# Patient Record
Sex: Female | Born: 1935
Health system: Southern US, Community
[De-identification: ages and names within clinical notes are randomized; demographics above are authoritative.]

## PROBLEM LIST (undated history)

## (undated) DIAGNOSIS — Y92009 Unspecified place in unspecified non-institutional (private) residence as the place of occurrence of the external cause: Secondary | ICD-10-CM

## (undated) DIAGNOSIS — F32A Depression, unspecified: Secondary | ICD-10-CM

## (undated) DIAGNOSIS — J449 Chronic obstructive pulmonary disease, unspecified: Secondary | ICD-10-CM

## (undated) DIAGNOSIS — K52832 Lymphocytic colitis: Secondary | ICD-10-CM

## (undated) DIAGNOSIS — F329 Major depressive disorder, single episode, unspecified: Secondary | ICD-10-CM

## (undated) DIAGNOSIS — D126 Benign neoplasm of colon, unspecified: Secondary | ICD-10-CM

## (undated) DIAGNOSIS — J189 Pneumonia, unspecified organism: Secondary | ICD-10-CM

## (undated) DIAGNOSIS — G932 Benign intracranial hypertension: Secondary | ICD-10-CM

## (undated) DIAGNOSIS — K279 Peptic ulcer, site unspecified, unspecified as acute or chronic, without hemorrhage or perforation: Secondary | ICD-10-CM

## (undated) DIAGNOSIS — F419 Anxiety disorder, unspecified: Secondary | ICD-10-CM

## (undated) DIAGNOSIS — K589 Irritable bowel syndrome without diarrhea: Secondary | ICD-10-CM

## (undated) DIAGNOSIS — W19XXXA Unspecified fall, initial encounter: Secondary | ICD-10-CM

## (undated) DIAGNOSIS — C801 Malignant (primary) neoplasm, unspecified: Secondary | ICD-10-CM

## (undated) DIAGNOSIS — I2699 Other pulmonary embolism without acute cor pulmonale: Secondary | ICD-10-CM

## (undated) DIAGNOSIS — E785 Hyperlipidemia, unspecified: Secondary | ICD-10-CM

## (undated) DIAGNOSIS — IMO0001 Reserved for inherently not codable concepts without codable children: Secondary | ICD-10-CM

## (undated) DIAGNOSIS — IMO0002 Reserved for concepts with insufficient information to code with codable children: Secondary | ICD-10-CM

## (undated) DIAGNOSIS — K219 Gastro-esophageal reflux disease without esophagitis: Secondary | ICD-10-CM

## (undated) HISTORY — PX: OTHER SURGICAL HISTORY: SHX169

## (undated) HISTORY — DX: Anxiety disorder, unspecified: F41.9

## (undated) HISTORY — PX: ABDOMINAL HYSTERECTOMY: SHX81

## (undated) HISTORY — DX: Major depressive disorder, single episode, unspecified: F32.9

## (undated) HISTORY — DX: Peptic ulcer, site unspecified, unspecified as acute or chronic, without hemorrhage or perforation: K27.9

## (undated) HISTORY — DX: Malignant (primary) neoplasm, unspecified: C80.1

## (undated) HISTORY — DX: Chronic obstructive pulmonary disease, unspecified: J44.9

## (undated) HISTORY — DX: Pneumonia, unspecified organism: J18.9

## (undated) HISTORY — DX: Reserved for inherently not codable concepts without codable children: IMO0001

## (undated) HISTORY — DX: Unspecified fall, initial encounter: W19.XXXA

## (undated) HISTORY — PX: UPPER GASTROINTESTINAL ENDOSCOPY: SHX188

## (undated) HISTORY — DX: Irritable bowel syndrome, unspecified: K58.9

## (undated) HISTORY — DX: Depression, unspecified: F32.A

## (undated) HISTORY — DX: Benign neoplasm of colon, unspecified: D12.6

## (undated) HISTORY — DX: Lymphocytic colitis: K52.832

## (undated) HISTORY — DX: Unspecified place in unspecified non-institutional (private) residence as the place of occurrence of the external cause: Y92.009

## (undated) HISTORY — DX: Benign intracranial hypertension: G93.2

## (undated) HISTORY — DX: Reserved for concepts with insufficient information to code with codable children: IMO0002

## (undated) HISTORY — DX: Gastro-esophageal reflux disease without esophagitis: K21.9

## (undated) HISTORY — PX: BRAIN SURGERY: SHX531

## (undated) HISTORY — PX: CATARACT EXTRACTION: SUR2

## (undated) HISTORY — DX: Other pulmonary embolism without acute cor pulmonale: I26.99

## (undated) HISTORY — DX: Hyperlipidemia, unspecified: E78.5

## (undated) HISTORY — PX: COLON RESECTION: SHX5231

## (undated) HISTORY — PX: COLONOSCOPY: SHX174

## (undated) HISTORY — PX: CHOLECYSTECTOMY: SHX55

---

## 1998-01-20 ENCOUNTER — Ambulatory Visit (HOSPITAL_COMMUNITY): Admission: RE | Admit: 1998-01-20 | Discharge: 1998-01-20 | Payer: Self-pay | Admitting: Family Medicine

## 1998-06-01 ENCOUNTER — Other Ambulatory Visit: Admission: RE | Admit: 1998-06-01 | Discharge: 1998-06-01 | Payer: Self-pay | Admitting: Family Medicine

## 1999-01-26 ENCOUNTER — Ambulatory Visit (HOSPITAL_COMMUNITY): Admission: RE | Admit: 1999-01-26 | Discharge: 1999-01-26 | Payer: Self-pay | Admitting: Family Medicine

## 1999-01-26 ENCOUNTER — Encounter: Payer: Self-pay | Admitting: Family Medicine

## 1999-02-10 ENCOUNTER — Encounter: Payer: Self-pay | Admitting: Family Medicine

## 1999-02-10 ENCOUNTER — Encounter: Admission: RE | Admit: 1999-02-10 | Discharge: 1999-02-10 | Payer: Self-pay | Admitting: Family Medicine

## 1999-07-22 ENCOUNTER — Other Ambulatory Visit: Admission: RE | Admit: 1999-07-22 | Discharge: 1999-07-22 | Payer: Self-pay | Admitting: *Deleted

## 2000-01-27 ENCOUNTER — Ambulatory Visit (HOSPITAL_COMMUNITY): Admission: RE | Admit: 2000-01-27 | Discharge: 2000-01-27 | Payer: Self-pay | Admitting: Family Medicine

## 2000-01-27 ENCOUNTER — Encounter: Payer: Self-pay | Admitting: Family Medicine

## 2000-02-24 ENCOUNTER — Encounter: Admission: RE | Admit: 2000-02-24 | Discharge: 2000-02-24 | Payer: Self-pay

## 2000-03-17 ENCOUNTER — Ambulatory Visit (HOSPITAL_COMMUNITY): Admission: RE | Admit: 2000-03-17 | Discharge: 2000-03-17 | Payer: Self-pay | Admitting: Gastroenterology

## 2000-03-17 ENCOUNTER — Encounter: Payer: Self-pay | Admitting: Gastroenterology

## 2000-04-13 ENCOUNTER — Encounter (INDEPENDENT_AMBULATORY_CARE_PROVIDER_SITE_OTHER): Payer: Self-pay | Admitting: Specialist

## 2000-04-13 ENCOUNTER — Other Ambulatory Visit: Admission: RE | Admit: 2000-04-13 | Discharge: 2000-04-13 | Payer: Self-pay | Admitting: Gastroenterology

## 2001-01-31 ENCOUNTER — Ambulatory Visit (HOSPITAL_COMMUNITY): Admission: RE | Admit: 2001-01-31 | Discharge: 2001-01-31 | Payer: Self-pay | Admitting: *Deleted

## 2001-02-01 ENCOUNTER — Ambulatory Visit (HOSPITAL_COMMUNITY): Admission: RE | Admit: 2001-02-01 | Discharge: 2001-02-01 | Payer: Self-pay | Admitting: Family Medicine

## 2001-02-01 ENCOUNTER — Encounter: Payer: Self-pay | Admitting: Family Medicine

## 2001-03-20 ENCOUNTER — Other Ambulatory Visit: Admission: RE | Admit: 2001-03-20 | Discharge: 2001-03-20 | Payer: Self-pay | Admitting: Family Medicine

## 2001-04-19 ENCOUNTER — Encounter (INDEPENDENT_AMBULATORY_CARE_PROVIDER_SITE_OTHER): Payer: Self-pay | Admitting: Gastroenterology

## 2001-04-19 ENCOUNTER — Ambulatory Visit (HOSPITAL_COMMUNITY): Admission: RE | Admit: 2001-04-19 | Discharge: 2001-04-19 | Payer: Self-pay | Admitting: Gastroenterology

## 2002-01-10 DIAGNOSIS — I2699 Other pulmonary embolism without acute cor pulmonale: Secondary | ICD-10-CM

## 2002-01-10 HISTORY — DX: Other pulmonary embolism without acute cor pulmonale: I26.99

## 2002-04-02 ENCOUNTER — Other Ambulatory Visit: Admission: RE | Admit: 2002-04-02 | Discharge: 2002-04-02 | Payer: Self-pay | Admitting: Family Medicine

## 2002-05-15 ENCOUNTER — Ambulatory Visit (HOSPITAL_COMMUNITY): Admission: RE | Admit: 2002-05-15 | Discharge: 2002-05-15 | Payer: Self-pay | Admitting: *Deleted

## 2002-05-15 ENCOUNTER — Encounter: Payer: Self-pay | Admitting: *Deleted

## 2002-07-23 ENCOUNTER — Encounter: Payer: Self-pay | Admitting: Family Medicine

## 2002-07-23 ENCOUNTER — Ambulatory Visit (HOSPITAL_COMMUNITY): Admission: RE | Admit: 2002-07-23 | Discharge: 2002-07-23 | Payer: Self-pay | Admitting: Family Medicine

## 2002-08-04 ENCOUNTER — Encounter: Payer: Self-pay | Admitting: Emergency Medicine

## 2002-08-05 ENCOUNTER — Inpatient Hospital Stay (HOSPITAL_COMMUNITY): Admission: EM | Admit: 2002-08-05 | Discharge: 2002-08-12 | Payer: Self-pay | Admitting: Emergency Medicine

## 2002-08-15 ENCOUNTER — Encounter: Admission: RE | Admit: 2002-08-15 | Discharge: 2002-08-15 | Payer: Self-pay | Admitting: Family Medicine

## 2002-10-30 ENCOUNTER — Encounter: Payer: Self-pay | Admitting: Neurosurgery

## 2002-11-01 ENCOUNTER — Inpatient Hospital Stay (HOSPITAL_COMMUNITY): Admission: RE | Admit: 2002-11-01 | Discharge: 2002-11-05 | Payer: Self-pay | Admitting: Neurosurgery

## 2002-11-02 ENCOUNTER — Encounter: Payer: Self-pay | Admitting: Neurosurgery

## 2002-11-12 ENCOUNTER — Encounter: Admission: RE | Admit: 2002-11-12 | Discharge: 2002-11-12 | Payer: Self-pay | Admitting: Neurosurgery

## 2002-11-28 ENCOUNTER — Encounter: Admission: RE | Admit: 2002-11-28 | Discharge: 2002-11-28 | Payer: Self-pay | Admitting: Neurosurgery

## 2003-01-28 ENCOUNTER — Encounter: Admission: RE | Admit: 2003-01-28 | Discharge: 2003-01-28 | Payer: Self-pay | Admitting: Neurosurgery

## 2003-02-01 ENCOUNTER — Inpatient Hospital Stay (HOSPITAL_COMMUNITY): Admission: EM | Admit: 2003-02-01 | Discharge: 2003-02-08 | Payer: Self-pay

## 2003-03-31 ENCOUNTER — Emergency Department (HOSPITAL_COMMUNITY): Admission: EM | Admit: 2003-03-31 | Discharge: 2003-04-01 | Payer: Self-pay | Admitting: *Deleted

## 2003-04-01 ENCOUNTER — Inpatient Hospital Stay (HOSPITAL_COMMUNITY): Admission: EM | Admit: 2003-04-01 | Discharge: 2003-04-09 | Payer: Self-pay | Admitting: Emergency Medicine

## 2003-04-28 ENCOUNTER — Encounter: Admission: RE | Admit: 2003-04-28 | Discharge: 2003-04-28 | Payer: Self-pay | Admitting: Neurosurgery

## 2003-05-16 ENCOUNTER — Ambulatory Visit (HOSPITAL_COMMUNITY): Admission: RE | Admit: 2003-05-16 | Discharge: 2003-05-16 | Payer: Self-pay | Admitting: *Deleted

## 2003-06-12 ENCOUNTER — Encounter: Admission: RE | Admit: 2003-06-12 | Discharge: 2003-06-12 | Payer: Self-pay | Admitting: Neurosurgery

## 2003-07-21 ENCOUNTER — Other Ambulatory Visit: Admission: RE | Admit: 2003-07-21 | Discharge: 2003-07-21 | Payer: Self-pay | Admitting: Family Medicine

## 2003-08-18 ENCOUNTER — Encounter: Admission: RE | Admit: 2003-08-18 | Discharge: 2003-08-18 | Payer: Self-pay | Admitting: Neurosurgery

## 2003-09-05 ENCOUNTER — Inpatient Hospital Stay (HOSPITAL_COMMUNITY): Admission: EM | Admit: 2003-09-05 | Discharge: 2003-09-08 | Payer: Self-pay | Admitting: *Deleted

## 2004-02-26 ENCOUNTER — Encounter: Admission: RE | Admit: 2004-02-26 | Discharge: 2004-02-26 | Payer: Self-pay | Admitting: Neurosurgery

## 2004-08-24 ENCOUNTER — Other Ambulatory Visit: Admission: RE | Admit: 2004-08-24 | Discharge: 2004-08-24 | Payer: Self-pay | Admitting: Family Medicine

## 2004-10-07 ENCOUNTER — Encounter: Admission: RE | Admit: 2004-10-07 | Discharge: 2004-10-07 | Payer: Self-pay | Admitting: *Deleted

## 2004-11-18 ENCOUNTER — Encounter: Admission: RE | Admit: 2004-11-18 | Discharge: 2004-11-18 | Payer: Self-pay | Admitting: Neurosurgery

## 2005-01-10 ENCOUNTER — Inpatient Hospital Stay (HOSPITAL_COMMUNITY): Admission: EM | Admit: 2005-01-10 | Discharge: 2005-02-11 | Payer: Self-pay | Admitting: Emergency Medicine

## 2005-01-10 ENCOUNTER — Ambulatory Visit: Payer: Self-pay | Admitting: Physical Medicine & Rehabilitation

## 2005-01-10 DIAGNOSIS — J189 Pneumonia, unspecified organism: Secondary | ICD-10-CM

## 2005-01-10 HISTORY — DX: Pneumonia, unspecified organism: J18.9

## 2005-01-12 ENCOUNTER — Encounter (INDEPENDENT_AMBULATORY_CARE_PROVIDER_SITE_OTHER): Payer: Self-pay | Admitting: Specialist

## 2005-01-13 ENCOUNTER — Ambulatory Visit: Payer: Self-pay | Admitting: Pulmonary Disease

## 2005-02-02 ENCOUNTER — Ambulatory Visit: Payer: Self-pay | Admitting: Infectious Diseases

## 2005-02-07 ENCOUNTER — Encounter: Payer: Self-pay | Admitting: Infectious Diseases

## 2005-02-11 ENCOUNTER — Ambulatory Visit: Payer: Self-pay | Admitting: Physical Medicine & Rehabilitation

## 2005-02-11 ENCOUNTER — Inpatient Hospital Stay (HOSPITAL_COMMUNITY)
Admission: RE | Admit: 2005-02-11 | Discharge: 2005-02-18 | Payer: Self-pay | Admitting: Physical Medicine & Rehabilitation

## 2005-11-07 ENCOUNTER — Ambulatory Visit: Payer: Self-pay | Admitting: Gastroenterology

## 2005-11-07 ENCOUNTER — Ambulatory Visit (HOSPITAL_COMMUNITY): Admission: RE | Admit: 2005-11-07 | Discharge: 2005-11-07 | Payer: Self-pay | Admitting: *Deleted

## 2005-11-09 ENCOUNTER — Ambulatory Visit: Payer: Self-pay | Admitting: Gastroenterology

## 2005-12-09 ENCOUNTER — Ambulatory Visit (HOSPITAL_COMMUNITY): Admission: RE | Admit: 2005-12-09 | Discharge: 2005-12-09 | Payer: Self-pay | Admitting: Surgery

## 2005-12-09 ENCOUNTER — Encounter (INDEPENDENT_AMBULATORY_CARE_PROVIDER_SITE_OTHER): Payer: Self-pay | Admitting: Specialist

## 2005-12-12 ENCOUNTER — Inpatient Hospital Stay (HOSPITAL_COMMUNITY): Admission: EM | Admit: 2005-12-12 | Discharge: 2005-12-18 | Payer: Self-pay | Admitting: Emergency Medicine

## 2005-12-12 ENCOUNTER — Ambulatory Visit: Payer: Self-pay | Admitting: Pulmonary Disease

## 2005-12-16 ENCOUNTER — Ambulatory Visit: Payer: Self-pay | Admitting: Internal Medicine

## 2006-01-09 ENCOUNTER — Ambulatory Visit: Payer: Self-pay | Admitting: Internal Medicine

## 2006-01-23 ENCOUNTER — Ambulatory Visit (HOSPITAL_COMMUNITY): Admission: RE | Admit: 2006-01-23 | Discharge: 2006-01-23 | Payer: Self-pay | Admitting: Internal Medicine

## 2006-01-23 ENCOUNTER — Encounter: Payer: Self-pay | Admitting: Internal Medicine

## 2006-01-26 ENCOUNTER — Ambulatory Visit: Payer: Self-pay | Admitting: Internal Medicine

## 2006-10-17 ENCOUNTER — Ambulatory Visit: Payer: Self-pay | Admitting: Internal Medicine

## 2006-10-31 ENCOUNTER — Encounter: Payer: Self-pay | Admitting: Internal Medicine

## 2006-10-31 ENCOUNTER — Ambulatory Visit: Payer: Self-pay | Admitting: Internal Medicine

## 2006-11-13 ENCOUNTER — Ambulatory Visit (HOSPITAL_COMMUNITY): Admission: RE | Admit: 2006-11-13 | Discharge: 2006-11-13 | Payer: Self-pay | Admitting: Family Medicine

## 2007-11-14 ENCOUNTER — Ambulatory Visit (HOSPITAL_COMMUNITY): Admission: RE | Admit: 2007-11-14 | Discharge: 2007-11-14 | Payer: Self-pay | Admitting: Family Medicine

## 2008-05-13 ENCOUNTER — Encounter: Payer: Self-pay | Admitting: Internal Medicine

## 2008-10-15 ENCOUNTER — Encounter: Payer: Self-pay | Admitting: Internal Medicine

## 2008-10-20 ENCOUNTER — Ambulatory Visit: Payer: Self-pay | Admitting: Internal Medicine

## 2008-10-20 DIAGNOSIS — K219 Gastro-esophageal reflux disease without esophagitis: Secondary | ICD-10-CM

## 2008-10-20 DIAGNOSIS — R1319 Other dysphagia: Secondary | ICD-10-CM

## 2008-10-31 ENCOUNTER — Ambulatory Visit: Payer: Self-pay | Admitting: Internal Medicine

## 2008-11-10 ENCOUNTER — Encounter: Admission: RE | Admit: 2008-11-10 | Discharge: 2008-11-10 | Payer: Self-pay | Admitting: Family Medicine

## 2008-11-19 ENCOUNTER — Ambulatory Visit (HOSPITAL_COMMUNITY): Admission: RE | Admit: 2008-11-19 | Discharge: 2008-11-19 | Payer: Self-pay | Admitting: Family Medicine

## 2008-12-10 ENCOUNTER — Encounter: Admission: RE | Admit: 2008-12-10 | Discharge: 2008-12-10 | Payer: Self-pay | Admitting: Family Medicine

## 2009-12-28 ENCOUNTER — Ambulatory Visit (HOSPITAL_COMMUNITY)
Admission: RE | Admit: 2009-12-28 | Discharge: 2009-12-28 | Payer: Self-pay | Source: Home / Self Care | Attending: Family Medicine | Admitting: Family Medicine

## 2010-01-30 ENCOUNTER — Encounter: Payer: Self-pay | Admitting: Family Medicine

## 2010-01-31 ENCOUNTER — Encounter: Payer: Self-pay | Admitting: Family Medicine

## 2010-01-31 ENCOUNTER — Encounter: Payer: Self-pay | Admitting: Internal Medicine

## 2010-01-31 ENCOUNTER — Encounter: Payer: Self-pay | Admitting: Neurosurgery

## 2010-05-28 NOTE — Discharge Summary (Signed)
NAMEABBAGAYLE, ZARAGOZA                ACCOUNT NO.:  [id number]   MEDICAL RECORD NO.:  [id number]          PATIENT TYPE:  INP   LOCATION:  5736                         FACILITY:  [facility]   PHYSICIAN:  Currie Paris, M.D.DATE OF BIRTH:  August 16, 1932   DATE OF ADMISSION:  12/12/2005  DATE OF DISCHARGE:  12/18/2005                               DISCHARGE SUMMARY   FINAL DIAGNOSES:  1. Bile leak status post laparoscopic cholecystectomy.  2. Severe chronic obstructive pulmonary disease.  3. History of ethanol alcohol abuse.  4. History of Wernicke encephalopathy.   CLINICAL HISTORY:  [name] is a 77 year old lady who underwent  cholecystectomy a few days prior to this admission.  She called the  office and was having increasing abdominal pain, nausea and vomiting.  She was seen in the Emergency Department and noted on exam to have  diffuse abdominal tenderness.  Laboratory studies showed some increase  in her white count of 13,000, slight increase in liver functions.  Clinically, she was thought to have probable bile leak.   HOSPITAL COURSE:  The patient was seen in the Emergency Department,  started on IV fluids and admitted.  We attempted to get a hepatobiliary  scan, but there is no contrast available, so a CT scan was performed.  This showed collection of fluid in the right upper quadrant consistent  with bile leak.   GI consultation was obtained and the next day, the patient taken to  Radiology where an ERCP and stent placement was performed.  She had an  apparent leak from a duct of Luschka.   Over the next several days, her GI tract gradually improved.  She did  have some mild respiratory insufficiency that was seen by Dr. Jayme Cloud  of Critical Care, but did not require intubation.   As noted, she was able to begin on some liquids.  She had somewhat low  potassium was given IV and then p.o. KCl.  Her bile leak as noted,  gradually decreased.  Her abdominal pain gradually  improved, but very  slow.  Some of her nausea was thought secondary to her hypokalemia.  Followup CT scan was also obtained and showed just an ileus pattern.  There is no evidence of abscess.  She did have some mild mental  confusion, but again this gradually improved and was just thought  secondary to sedation and chronic illness.   By the sixth hospital day, she was improving.  Because of  malnourishment, we did put her on TNA and that was able to be  discontinued on the 8th.  She was tolerating a solid diet, ambulating,  breathing well.  Appropriate oxygen sats.  Overall, feeling well.   The patient is discharged on 12/09 to be followed in my office.   LABORATORY STUDIES:  Admission white count of 13,000, hemoglobin of 15.  This drifted down to 10 white count and 10 hemoglobin just prior to  discharge.  Potassium on admission was 4.1, but had improved to 4 by  12/07.  One BMP was 121 which was slightly elevated.  At time of  discharge, liver functions were normal.  We had two stools that were  negative for C. Diff.   DISCHARGE CONDITION:  She was in satisfactory condition.   MEDICATIONS:  As noted on the chart.      Currie Paris, M.D.  Electronically Signed     CJS/MEDQ  D:  03/01/2006  T:  03/01/2006  Job:  [id number]

## 2010-05-28 NOTE — Discharge Summary (Signed)
NAMEENIS, LEATHERWOOD                ACCOUNT NO.:  [id number]   MEDICAL RECORD NO.:  [id number]          PATIENT TYPE:  IPS   LOCATION:  4028                         FACILITY:  [facility]   PHYSICIAN:  Ranelle Oyster, M.D.DATE OF BIRTH:  11-Jun-1932   DATE OF ADMISSION:  02/11/2005  DATE OF DISCHARGE:  02/18/2005                                 DISCHARGE SUMMARY   DISCHARGE DIAGNOSIS:  1.  Deconditioning secondary to VDRS with thoracotomy secondary to      hemothorax and empyema.  2.  Mild hyponatremia, resolved.  3.  Chronic obstructive pulmonary disease.  4.  Normal pressure hydrocephalus.  5.  Pseudomonas tracheobronchitis, treated.  6.  Anxiety disorder.   HISTORY OF PRESENT ILLNESS:  Ms. Thurow is a 77 year old female with a  history of COPD, ETOH, and tobacco abuse, admitted January 1 with recent  pneumonia and left pleuritic chest pain.  She was noted to have left  pneumonia, started on IV vancomycin for treatment on January 3.  Patient  with increased shortness of breath and acute respiratory failure requiring  chest tube placement for empyema drainage.  She did require admission later  that day.  She was also noted to have acute blood loss anemia with  hypovolemic shock and massive left hemothorax on top of pre-existing  empyema.  She underwent emergent thoracotomy for evacuation of hemothorax  and drainage of empyema when seen by Dr. Cornelius Moras.  Patient with difficulty at  extubation attempts, therefore, she was trach'd on January 8.  Currently,  patient off vent and has been down sized to #4 Shiley.  She is tolerating  Passey-Muir valve trials and swallow evaluation was done.  The patient was  currently advanced to a regular diet, thin liquids.  She has been treated  with multiple antibiotics, the most recent BAL culture positive for  Pseudomonas and patient has been on Cipro since January 29.  She is noted to  have impairments in mobility and self-care and rehab was consulted  for  further therapies.   PAST MEDICAL HISTORY:  Significant for encephalopathy, COPD, ETOH abuse,  dementia with NPH, depression, hysterectomy, essential tremor, BPS with  removal in October 2005 secondary to infection.   ALLERGIES:  Penicillin, codeine, and sulfa.   FAMILY HISTORY:  Positive for cancer and Alzheimer's disease.   SOCIAL HISTORY:  The patient lives with daughter in one level with 3-5 steps  at entry.  She smokes one pack per day.  She admits to having one drink  daily.  Her daughter is an Charity fundraiser who is not working currently.   HOSPITAL COURSE:  Ms. Llewellyn Schoenberger admitted to rehab on February 11, 2005,  for inpatient therapies to consist of PT and OT daily.  Past admission,  subcu Lovenox was added for DVT prophylaxis until mobility improved.  Patient with high levels of anxiety and noted to have anxiety with attempts  at total occlusion of trach initially.  She was started on BuSpar 5 mg  b.i.d. on February 3.  She was able to tolerate Passey-Muir valve for 24  hour period throughout February  4 and, therefore, she was decannulated on  February 14, 2005, without difficulty.  Currently, respiratory status has  been stable without any signs of hypoxia.  Anxiety levels continue to be an  issue and BuSpar was increased to 10 mg p.o. b.i.d.  Follow up labs done to  check on anemia reveals H&H to be improving with hemoglobin 11.1, hematocrit  33.4, white count 9.5, bilirubin 397.  Check of electrolytes reveal sodium  129, potassium 3.5, chloride 109, CO2 23, BUN 7, creatinine 0.7, glucose  100.  LFTs improving with albumin 2.4, AST 26, ALT 57, alkaline phos 90, T-  bili 0.7.  The patient's  mood has been stable.  She has been motivated and  participated well with therapy.  Currently, is continent of bowel and  bladder.  She was started on Senokot S 2 p.o. q.h.s. increase to b.i.d. for  constipation.  P.o. intake has been good.  At the time of discharge, the  patient is at  supervision level for transfers, min assist for ambulating 150  feet without assistive device, supervision ambulating 200 feet with  assistive device.  She requires min to mod assist to navigate stairs.  She  is at supervision to set up assist for ADLs.  Family is to provide 24 hour  supervision past discharge.  She will continue with further follow up home  health physical therapy and occupational therapy by [facility].  Home  health RN has been arranged to follow up with closure of tracheostomy past  discharge.  On February 18, 2005, the patient is discharged to home.   DISCHARGE MEDICATIONS:  Ferrous sulfate 325 mg b.i.d., Senokot-S 2 p.o.  q.h.s., Nexium 40 mg daily, BuSpar 10 mg b.i.d., Zyrtec 10 mg per day,  Flonase and Albuterol p.r.n. as at home, OK to resume Remeron at 1/2 p.o.  q.h.s. for one week then increase to one per day, Advair 1 puff b.i.d.,  Valium only as needed.   DISCHARGE INSTRUCTIONS:  Activities at supervision level.  Wound care:  Wear  dry occlusive dressing until stoma is fully closed up.  No alcohol, no  smoking, no driving.  Do not use Phenobarb, Dyazide, and Paxil.  The patient  to follow up with Dr. Lowanda Foster for further adjustment of meds.  Follow up  with Dr. Riley Kill as needed.      Greg Cutter, P.A.      Ranelle Oyster, M.D.  Electronically Signed    PP/MEDQ  D:  02/18/2005  T:  02/19/2005  Job:  [id number]   cc:   Caryl Comes. Slotnick, M.D.  Fax: [phone]   Salvatore Decent. Cornelius Moras, M.D.  [address]  [place]  [place] [address]   Lacretia Leigh. Ninetta Lights, M.D.  Fax: [phone]   Coralyn Helling, M.D.

## 2010-05-28 NOTE — H&P (Signed)
NAME:  [name], [name]                          ACCOUNT NO.:  [id number]   MEDICAL RECORD NO.:  [id number]                   PATIENT TYPE:  INP   LOCATION:  1827                                 FACILITY:  [facility]   PHYSICIAN:  Hilda [name], M.D.                DATE OF BIRTH:  01/26/1932   DATE OF ADMISSION:  04/01/2003  DATE OF DISCHARGE:                                HISTORY & PHYSICAL   TIME:  4:40 a.m.   [name] is a lady who was brought last night to the emergency room by her  husband because of fever and decreased level of consciousness.  I was called  about 1 o'clock this morning to let me know that [name] was admitted to  the hospital with fever, and there was a possibility of shunt malfunction.  I advised for [name] to be transferred to [facility], and  finally, she arrived here about 4:15 a.m.  The history that she was doing  fairly well, but yesterday she started having some decreased level of  consciousness, she had a fever, and she was shaking all over.  Patient had  been unable to eat or drink.  At __________ emergency room, she had a urine  which was negative.  Her chest x-ray which was negative.  Blood count which  showed 22,000 white cells, and a CT scan which showed the possibility of  shunt malfunction.  Because we did the surgery, we were called for  admission.   PAST MEDICAL HISTORY:  1. She has a history of hypertension.  2. Depression.  3. Back pain.  4. COPD.  5. Tobacco use.   Patient used to drink heavy until sometime, September, 2004, where she  stopped according to the family.   MEDICATIONS:  1. She is taking Paxil.  2. She is taking Nexium.  3. Premarin.  4. BuSpar.   SHE IS ALLERGIC TO PENICILLIN, SULFA, AND CODEINE.   PHYSICAL EXAMINATION:  GENERAL:  Patient who is able to open her eyes, and  she moves all four extremities, and she answers per yes and no.  EYES:  She has patch on the left eye.  This is secondary to  cranial nerve  palsy.  NECK:  She has some rigidity of the neck.  LUNGS:  There is some bilateral rhonchi.  HEART:  Sounds normal.  ABDOMEN:  Normal.  EXTREMITIES:  Normal pulses.  NEURO:  She is able to answer yes and no.  She is oriented only to time.  Cranial nerves:  She has a patch on the left eye.  The face is symmetrical.  She is able to swallow.  CNS seems to be normal.  Strength:  She moves all  the four extremities.  __________.   Unfortunately, the CT scan was not sent, however, it was read by the  radiologist as a possible shunt malfunction.  I obtained  this film myself,  and, indeed, the shunt was working appropriately.   [name] did review upon arrival at __________ was 102, and here is 101.   CLINICAL IMPRESSION:  Fever.  Rule out the possibility of shunt malfunction  or infection.   RECOMMENDATIONS:  The patient is going to be admitted to my service.  I will  be calling Dr. Genene Churn. Love from the Neurology service as well as  Infectious Disease tomorrow.  I will be capping the shunt tonight after  insuring there is no infection.  I talked to the husband and to the daughter  and made them aware about the admission would be to identify the source of  the fever, but it does not seem that a leak that is coming from the shunt,  and the shunt is working properly.                                                Hilda [name], M.D.    EB/MEDQ  D:  04/01/2003  T:  04/01/2003  Job:  [id number]

## 2010-05-28 NOTE — Op Note (Signed)
[name], [name]                ACCOUNT NO.:  [id number]   MEDICAL RECORD NO.:  [id number]          PATIENT TYPE:  AMB   LOCATION:  SDS                          FACILITY:  [facility]   PHYSICIAN:  Currie Paris, M.D.DATE OF BIRTH:  05/04/1932   DATE OF PROCEDURE:  12/09/2005  DATE OF DISCHARGE:  12/09/2005                               OPERATIVE REPORT   PREOPERATIVE DIAGNOSIS:  Chronic calculus cholecystitis.   POSTOPERATIVE DIAGNOSIS:  Chronic calculus cholecystitis.   OPERATION:  Laparoscopic cholecystectomy with operative cholangiogram.   SURGEON:  Currie Paris, M.D.   ASSISTANT:  Gita Kudo, M.D.   ANESTHESIA:  General endotracheal.   CLINICAL HISTORY:  This is a 73-year lady with significant COPD but has  had GI symptoms including those that sounded like biliary colic.  Workup  revealed gallstones.  She elected to proceed to laparoscopic  cholecystectomy.   DESCRIPTION OF PROCEDURE:  The patient was seen in the holding area.  She had no further questions.  We confirmed that cholecystectomy was the  planned procedure.  The patient was taken to the operating room and  after satisfactory general endotracheal anesthesia had been obtained,  the abdomen was prepped and draped.  The time-out occurred.   0.25% plain Marcaine was used for each incision.  The initial incision  was made just above the umbilicus.  She had a lower midline scar that  extended to the level of the umbilicus or just a little bit above.  The  fascia was opened and the peritoneal cavity identified.  There were  omental adhesions that were gently taken down with finger dissection.   A pursestring suture of 0 Vicryl was placed, the [place] introduced, and  the abdomen insufflated to 15.  There were multiple adhesions but I had  a clear view of the upper midline, so I put a 10/11 trocar in under  direct vision.  I put the camera in there and noted that we had  adhesions extending into the  right upper quadrant from a prior right  upper quadrant scar.  She was unclear as to what that scar was from but  given its location and the adhesions of omentum up here, I suspect she  may have had a temporary colostomy.  At any rate, these were taken down  sharply with a couple of clips placed on some vessels.  This gave [place]  good exposure of the right upper quadrant.  Two 5 mm trocars were then  placed under direct vision.   The patient was placed in reverse Trendelenburg and tilted to the left.  Multiple omental adhesions to the gallbladder were taken down.  The  peritoneum over the cystic duct was opened and I was able to dissect out  a nice long length of cystic duct and cystic artery to make a nice  window behind these two structures and see there were no other  structures in the triangle of Calot.   A single clip was placed on the cystic duct and one on the cystic  artery.  The cystic duct was opened and  a taut catheter introduced.  Operative cholangiogram showed normal duct with good filling of the  duodenum and hepatic radicals.  The catheter was removed and three clips  placed on the stay side of the cystic duct.  Three additional clips were  placed on the cystic artery and it was divided.  The gallbladder was  removed from below to above.  A small vessel laterally was clipped.  The  gallbladder was placed in a bag temporarily then we irrigated and made  sure the bed of the gallbladder was dry.  The gallbladder was then  brought out the umbilical port.   I closed the supraumbilical site was to figure-of-eight sutures of 0  Prolene.  I then reinsufflated and made a final check for hemostasis  with the camera in the epigastric port.  I looked back to make sure  there had been no evidence of bowel getting caught in the umbilical  incision and everything looked fine.  The lateral ports were removed  under direct vision.  The abdomen was deflated through the epigastric  port  which was then removed.  The skin was closed with 4-0 Monocryl  subcuticular and Dermabond.  The patient tolerated the procedure well.  There no operative complications and all counts were correct.      Currie Paris, M.D.  Electronically Signed     CJS/MEDQ  D:  12/09/2005  T:  12/09/2005  Job:  [id number]   cc:   [name], M.D.  Sara Mort, MD

## 2010-05-28 NOTE — Discharge Summary (Signed)
   NAME:  [name], [name]                          ACCOUNT NO.:  [id number]   MEDICAL RECORD NO.:  [id number]                   PATIENT TYPE:  INP   LOCATION:  3037                                 FACILITY:  [facility]   PHYSICIAN:  Hilda Lias, M.D.                DATE OF BIRTH:  1932-01-29   DATE OF ADMISSION:  11/01/2002  DATE OF DISCHARGE:  11/05/2002                                 DISCHARGE SUMMARY   ADMISSION DIAGNOSES:  1. Normal pressure hydrocephalus.  2. Alzheimer's disease.   POSTOPERATIVE DIAGNOSES:  1. Normal pressure hydrocephalus.  2. Alzheimer's disease.   CLINICAL HISTORY:  The patient was admitted after being worked up by Dr.  Avie Echevaria. The patient has a large ventricle which after she had a spinal  tap her gait and memory got better. CSF was positive for Alzheimer's  disease. Because of the symptoms, Dr. Sandria Manly felt that she needed a VP shunt.   LABORATORY DATA:  Normal.   COURSE IN THE HOSPITAL:  The patient was taken to surgery, and a right  ventriculoperitoneal shunt was inserted.  The valve was brought down to 100  mm operating pressure. Since then, we have repeated the CT scan which showed  _________ of the ventricle. Today, she is doing much better, has minimal  complaint. The wound looks fine, and she is ready to go home.   CONDITION ON DISCHARGE:  Improving.   MEDICATIONS:  She is going to be taking the same medications as prior to  being admitted to the hospital. She will be taking Tylenol for pain.   DIET:  Regular.   ACTIVITY:  She is going to be seen by the home health service for followup  therapy. I will be see her in my office in two weeks.                                                Hilda Lias, M.D.    EB/MEDQ  D:  11/05/2002  T:  11/05/2002  Job:  [id number]

## 2010-05-28 NOTE — Consult Note (Signed)
NAME:  [name], [name]                          ACCOUNT NO.:  [id number]   MEDICAL RECORD NO.:  [id number]                   PATIENT TYPE:  INP   LOCATION:  3021                                 FACILITY:  [facility]   PHYSICIAN:  Genene Churn. Love, M.D.                 DATE OF BIRTH:  08-16-1932   DATE OF CONSULTATION:  08/08/2002  DATE OF DISCHARGE:                                   CONSULTATION   REASON FOR CONSULTATION:  This 77 year old right-handed white married female  with COPD, IBS, depression, diverticulitis, sinus infections,  gastroesophageal reflux disease was admitted on August 04, 2002 for altered  mental status and confusional state.  We are now asked to see her for  evaluation of gait and possible dementia.   HISTORY OF PRESENT ILLNESS:  [name] presented with altered mental  status, had a leukocytosis, and a history of emphysema.  She has had a 1-1/2  year history of falling and tends to fall either forward or to her right  according to her husband and at times has exhibited festinating gait.  She  fell as recently as February, striking her head, but has not used a cane or  walker.  He has not noted memory problems but other family members have  noted memory problems.  She still keeps her mother's books.  She has known  history of alcohol use, approximately 3 ounces per day, and a positive  family history of Alzheimer's disease and her mother had that disorder.  She  has had an MRI study of the brain as an outpatient on July 23, 2002 which  showed small right vertebral artery and thickening of the frontal sinuses  and diffuse atrophy and minimal hydrocephalus with diffuse atrophy.  No  mention was made of significant small vessel ischemic disease.  There was no  evidence of an acute infarction.  She has no history of exposure to  medications associated with Parkinson's-like features.  She has not had any  neck pain radiating to her hands and arms.  She denies any bowel or  bladder  incontinence but does have irritable bowel syndrome.  Her husband has noted  a shuffling-type of gait with weakness in her feet and legs.  There has  possibly been some change in her handwriting with more tremor in her  handwriting.  She also has a past history of endometriosis, cholecystectomy,  pulmonary emboli, and DVT.   MEDICATIONS:  1. Paxil CR 25 mg p.o. daily.  2. Nexium 40 mg p.o. daily.  3. Triamterene/HCTZ 37.5/25 one p.o. daily.  4. Zyrtec 10 mg 1 p.o. p.r.n.  5. Diazepam 10 mg 1 p.o. t.i.d. and 5 mg p.o. t.i.d.  6. Hyoscyamine 0.375 mg 1 p.o. daily.  7. Vitamin A 10,000 international units p.o. daily.  8. Mucinex 600 mg p.o. daily.  9. Premarin 0.625 mg daily.  10.  B12 1000 mEq 1 p.o. daily.  11.      Garlic 600 mg 2 p.o. daily.  12.      Vitamin C 1000 mg 2 p.o. daily.  13.      Calcium with vitamin D 600 mg 2 p.o. daily.  14.      Vitamin E 1000 international units p.o. daily.  15.      Folic acid 400 mcg 1 p.o. daily.  16.      Cider vinegar tablets 1 p.o. daily.  17.      Aspirin 325 mg p.o. daily.   PAST MEDICAL HISTORY:  1. Chronic obstructive pulmonary disease.  2. Irritable bowel syndrome.  3. Depression.  4. Diverticulitis.  5. Allergies.  6. Pulmonary emboli.  7. DVT.  8. Partial colectomy.  9. Fractured foot in 1999.  10.      Hemorrhoid surgery many years ago.   SOCIAL HISTORY:  She does drink alcohol.  She admits to 3 ounces per night.  She smokes one pack per day of cigarettes.   ALLERGIES:  1. PENICILLIN.  2. SULFA.  3. CODEINE.   PHYSICAL EXAMINATION:  GENERAL:  A well-developed white female with marked  bradykinesia.  VITAL SIGNS:  Sitting blood pressure in the right and left arm in 95/60  range, standing 120/60 with a heart rate of 64.  No carotid or  supraclavicular bruits heard.  Marked bradykinesia with psychomotor  retardation.  NEUROLOGIC:  Mental status:  Alert, oriented to person, place, year, and  month.  Could  remember one of three objects at five minutes.  Could not  remember what she had for dinner the night before.  Could not perform serial  7s except to 93.  Knew the day of the week and date.  Would have scored  approximately 22/30 on MMSE.  Cranial nerve examination revealed visual  fields to be full, disks were flat.  The extraocular movements were full  including up and down gaze, corneals were present, and facial sensation was  equal.  There was no facial motor asymmetry.  She had hypomimia and  hypophonia.  Her tongue was midline, gags were present.  Motor examination  revealed no significant increased tone.  She did have an outstretched hand  and arm tremor.  Her deep tendon reflexes were 2-3+ and plantar responses  were thought to be downgoing when the patient relaxed but at times would  withdraw.  Her gait examination revealed an ataxic type of gait falling both  to the left and to the right with associated retropulsion both in the  standing and in the sitting position.  She did not have a festinating  quality to her gait on examination but one had been described by her  husband.   IMPRESSION:  1. Gait disorder (code 781.2).  2. Dementia (code 780.93).  3. Atrophy on CT scan and possible normal-pressure hydrocephalus (code     794.09).  4. Possible hydrocephalus by history but no associated bowel or bladder     incontinence (code 331.3).    PLAN:  The plan at this time is to obtain a cerebrospinal fluid evaluation  and do drainage procedure with physical therapy in order to see if lumbar  puncture improves possible gait disorder and confirm the diagnosis of normal-  pressure hydrocephalus.  Other consideration would be use cerebrospinal  fluid for evaluation for tau and beta-amyloid 42 protein.  Genene Churn. Sandria Manly, M.D.    JML/MEDQ  D:  08/08/2002  T:  08/09/2002  Job:  [id number]  cc:   Ernestina Penna, M.D.  [address] [place]  [place] [address]  Fax: [phone]

## 2010-05-28 NOTE — Consult Note (Signed)
NAME:  [name], [name]                          ACCOUNT NO.:  [id number]   MEDICAL RECORD NO.:  [id number]                   PATIENT TYPE:  INP   LOCATION:  3018                                 FACILITY:  [facility]   PHYSICIAN:  Hilda Lias, M.D.                DATE OF BIRTH:  20-Sep-1932   DATE OF CONSULTATION:  02/04/2003  DATE OF DISCHARGE:                                   CONSULTATION   REFERRING SERVICE:  Neurology.   REASON FOR CONSULTATION:  [name] is a lady well-known to me who underwent  a shunt for normal-pressure hydrocephalus.  I had been seeing her in my  office and she had a CT scan about a week ago which showed normal  ventricles, no evidence of hygroma.  She has sinusitis.  This lady was  admitted to [facility] because of possibility of stroke.  MRI shows  enhancement of the dura mater.  Neurology has called [place] to rule out the  possibility of infection.   PROCEDURE:  The patient had the shunt prepped with Betadine.  After that, we  used a small 25-gauge needle and clear CSF was obtained.  The specimen is  going to be sent to the laboratory for protein, cell count, Gram's stain,  culture and sensitivity.   We will follow her while she is in the hospital.                                               Hilda Lias, M.D.    EB/MEDQ  D:  02/04/2003  T:  02/05/2003  Job:  [id number]

## 2010-05-28 NOTE — Discharge Summary (Signed)
NAME:  [name], [name]                          ACCOUNT NO.:  [id number]   MEDICAL RECORD NO.:  [id number]                   PATIENT TYPE:  INP   LOCATION:  3040                                 FACILITY:  [facility]   PHYSICIAN:  Stefani Dama, M.D.               DATE OF BIRTH:  [birth year]   DATE OF ADMISSION:  04/01/2003  DATE OF DISCHARGE:  04/09/2003                                 DISCHARGE SUMMARY   ADMISSION DIAGNOSES:  1. Shunt infection.  2. Pneumocephalus.  3. Pneumoperitoneum.   FINAL DIAGNOSES:  1. Shunt infection.  2. Pneumocephalus.  3. Pneumoperitoneum.   OPERATION:  Removal of ventriculoperitoneal shunt on April 01, 2003, by Dr.  Hilda Lias.   CONSULTATIONS:  1. Neurology, Dr. Melbourne Abts.  2. Infectious disease, Dr. Lenn Sink.   HOSPITAL COURSE:  [name] is a 77 year old individual who was felt  to have normal pressure hydrocephalus.  She underwent placement of a  ventriculoperitoneal shunt for treatment of this process and seemed to be  tolerating this quite well.  However, she had had hospitalizations in the  end of January for what was felt to be a shunt infection.  this was ruled  out with a shunt tap, however.  Because of further mental status changes,  she was found to have fever and CT scan demonstrated the presence of  significant pneumocephalus.  There was also pneumoperitoneum that was noted.  She had temperature to 102.2 at [facility] and was transferred to  [facility] for further care.   The patient, at the time of admission, was noted to have a sodium of 129,  potassium 3.0; a white count of 22,000, along with fever.  Because it was  felt that she had a shunt infection, she underwent tapping of the shunt. On  arrival, glucose was 80, protein was 23.  The assessed white cells showed  equal monocytes and polymorphonuclear cells, a total of 27, with 10 rbc.  It  was uncertain whether the patient had clear  cut evidence of an infection  nonetheless, because of the fever, her shunt was removed.  The patient was  seen in consultation by infectious diseases and she was started on some  antibiotics.  Her cultures never grew any substantial organism and because  of this, the antibiotics were stopped after seven days.  The patient has  remained afebrile and follow-up CAT scan of the brain demonstrates that  there is decreased pneumocephalus.  The patient is tolerating a regular  diet.  She was seen by general surgery also to rule out the perforated  viscus, yet because of the benign condition of her abdomen, save for the  free air, it was felt that she did not have a perforated viscus.  Her  symptoms are clear subsided and at this time the patient seems to be  tolerating activity well without  the shunt.  It was noted that the patient  did have a hospitalization in January after her shunt had been placed in  October of 2004.  She was noted to have a __________ encephalopathy and has  a significant history of previous alcoholism.   At this point, the patient is tolerating a regular diet.  She is ambulatory.  Her incision is clean and dry and she will be followed up by Dr. Jeral Fruit in  approximately two weeks' time.  She appears to have Dermabond on the scalp  incision and no sutures appeared to need removal.   CONDITION ON DISCHARGE:  Improved.                                                Stefani Dama, M.D.    Merla Riches  D:  04/09/2003  T:  04/09/2003  Job:  [id number]

## 2010-05-28 NOTE — H&P (Signed)
NAME:  [name], [name]                          ACCOUNT NO.:  [id number]   MEDICAL RECORD NO.:  [id number]                   PATIENT TYPE:  INP   LOCATION:  3040                                 FACILITY:  [facility]   PHYSICIAN:  Sandria Bales. Ezzard Standing, M.D.               DATE OF BIRTH:  05/06/1932   DATE OF ADMISSION:  04/01/2003  DATE OF DISCHARGE:                                HISTORY & PHYSICAL   HISTORY OF PRESENT ILLNESS:  This is a 77 year old white female who has a  fairly complicated history.  She was noted to have an MRI with atrophy and  hydrocephalus in July of 2004.  She had a ventricular peritoneal shunt for  normal pressure hydrocephalus by Dr. Jeral Fruit in October of 2004.  She was  hospitalized in January 2005 for a progressive gait disturbance thought to  be part Wernicke's encephalopathy versus withdrawal syndrome with delirium.   Then apparently she presented to [facility] yesterday with increased  confusion and fever and was then transferred down to Dr. Jeral Fruit today.   He obtained a CT scan of her abdomen.  The CT scan shows air in her  ventricles and air in her abdomen so she has a pneumoperitoneum.   Talking to her husband she has had no history of peptic ulcer disease, liver  disease, pancreatic disease, or prior abdominal surgery, had remotely a  hysterectomy.  Then in 1994 Dr. Mosetta Anis did a colon resection for scar  tissue and she has had the ventricular peritoneal shunt placed in October.  She has no known ulcer disease or colon problems.   PAST MEDICAL HISTORY:   ALLERGIES:  1. She is allergic to PENICILLIN.  2. SULFA.  3. CODEINE.   CURRENT MEDICATIONS:  1. Phenobarbital 30 mg daily.  2. Valium 5 mg t.i.d.  3. Aspirin 325 mg daily.  4. Paxil CR 25 mg daily.  5. Vitamin C.  6. Os-Cal.  7. Premarin 0.625 mg daily.  8. Nexium 40 mg daily.  9. Flonase nightly.   REVIEW OF SYSTEMS:  NEUROLOGIC:  Hydrocephalus for which she has a shunt in.  She has  fever.  She has a history of alcohol abuse, none since about  September of last year.  PULMONARY:  She had a history of pneumonia in  September 2004 and some COPD.  CARDIAC:  She has no history of heart disease  and she was treated for hypertension.  GASTROINTESTINAL:  She has a hiatal  hernia with reflux for which she is taking Nexium but no specific GI  symptoms.  UROLOGIC:  She __________  a kidney infection.  GYN:  She has one  daughter who is 81 years of age.  Both her daughter and her husband were in  the room when I examined her mother.  The daughter's name is Leane Para.  The husband is married to her for about 10 years.  She also has history of back pain, depression, chronic obstructive pulmonary  disease.   PHYSICAL EXAMINATION:  VITAL SIGNS:  Her temperature is 98.1, pulse 88,  blood pressure 110/61, respirations 20.  GENERAL:  She is a little bit confused but she answers some questions  appropriately, then not appropriately.  NECK:  Supple.  No mass or thyromegaly.  LUNGS:  Clear to auscultation.  HEART:  Regular rate and rhythm without murmur or rub.  ABDOMEN:  Benign.  She has active bowel sounds in all four quadrants.  She  has a well-healed right upper quadrant incision with her lower midline  incision.  No hernia, no mass.  She has got no peritoneal or surgical  symptoms at all in her abdominal exam.  RECTAL:  I did not do a rectal exam.  EXTREMITIES:  She moves her upper and lower extremities.  She has got pretty  good strength in her upper and lower extremities.   LABORATORY DATA:  White blood count 26,600, hemoglobin 13.6, hematocrit 38,  her neutrophils are 88%, her lymphocytes are 6.  Sodium is 131, potassium  3.7, chloride 96, CO2 28, glucose 124, BUN 8.  Her alkaline phosphatase is  52, SGOT 22, SGPT 24, total protein is 5.8, albumin 3.1.   I reviewed the CT scan with Anselmo Pickler, and she does have this  pneumoperitoneum, pneumocephalus, but I do not see  any evidence of acute  changes in her abdomen.  It is my impression she has pneumoperitoneum but no  clear surgical cause for this pneumoperitoneum.  I wonder whether she has a  shunt infection as part of her problem.  I would favor keeping her n.p.o.  for 24 hours from a bowel standpoint and consider removing the shunt and get  her hooked up __________  and then reexamine the patient.   IMPRESSION:  1. Wernicke's encephalopathy and history of withdrawal syndrome.  2. Central tremor.  3. Chronic obstructive pulmonary disease.  4. Hypertension.  5. Leukocytosis with fever.  6. Depression.                                                Sandria Bales. Ezzard Standing, M.D.    DHN/MEDQ  D:  04/01/2003  T:  04/01/2003  Job:  [id number]   cc:   Lacretia Leigh. Ninetta Lights, M.D.  [address]  [place]  [place] [address]  Fax: [phone]   Genene Churn. Love, M.D.  [address]  [address]  [place]  [place] [address]  Fax: [phone]   Caryl Comes. Slotnick, M.D.  [address] [place]  [place] [address]  Fax: [phone]

## 2010-05-28 NOTE — Op Note (Signed)
NAME:  UMA, JERDE                          ACCOUNT NO.:  [id number]   MEDICAL RECORD NO.:  [id number]                   PATIENT TYPE:  INP   LOCATION:  3040                                 FACILITY:  [facility]   PHYSICIAN:  Hilda Lias, M.D.                DATE OF BIRTH:  08-Oct-1932   DATE OF PROCEDURE:  04/01/2003  DATE OF DISCHARGE:                                 OPERATIVE REPORT   PREOPERATIVE DIAGNOSES:  1. Rule out shunt infection, status post normal pressure hydrocephalus.  2. Free air in the abdominal cavity.   POSTOPERATIVE DIAGNOSES:  1. Rule out shunt infection, status post normal pressure hydrocephalus.  2. Free air in the abdominal cavity.   PROCEDURE:  Removal of ventriculoperitoneal shunt under local.   PROCEDURE:  The patient was taken to the OR and the right side of the  frontal area where she had the shunt was prepared with Betadine.  Drapes  were applied.  Infiltration with Xylocaine 1% was made.  Intravenous  sedation was given.  Incision was made through the scalp down to the  periosteum.  We found the __________ was attached to the catheter.  This was  removed.  We did dissection proximal distal.  We pulled the ventricular part  of the catheter and sent to laboratory.  The rest of the shunt was removed  easily.  CSF came through the wound, was also sent to the laboratory.  Then  the area was irrigated and the wound was closed with Vicryl and Dermabond.                                               Hilda Lias, M.D.    EB/MEDQ  D:  04/01/2003  T:  04/02/2003  Job:  [id number]

## 2010-05-28 NOTE — H&P (Signed)
NAME:  [name], [name]                          ACCOUNT NO.:  [id number]   MEDICAL RECORD NO.:  [id number]                   PATIENT TYPE:  INP   LOCATION:  5159                                 FACILITY:  [facility]   PHYSICIAN:  [name], D.O.                 DATE OF BIRTH:  Jul 02, 1932   DATE OF ADMISSION:  09/04/2003  DATE OF DISCHARGE:                                HISTORY & PHYSICAL   PRIMARY CARE PHYSICIAN:  Dr. Caryl Comes. Slotnick in [employer], therefore she is being admitted to [place] as unassigned.  Dr.  Levander Campion office number is [phone].   CHIEF COMPLAINT:  Cough, fever of 102 and weakness.   HISTORY OF PRESENTING ILLNESS:  This is a pleasant Caucasian female with a  history of COPD and tobacco dependence, continued non-O2-dependent, as well  as a complicated history with regard to normal-pressure hydrocephalus, shunt  placement and status post removal due to shunt infection back in March, who  has been doing quite well up until about a month ago, when she was initiated  on antibiotic therapy for sinusitis.  She had been on antibiotics for  approximately a month and about a week ago, she began to develop fevers,  productive cough, sputum and generalized weakness; fevers this evening were  102 at home.  In any event, she has had a pneumonia, according to her and  her husband, about a year ago.   She has allergies to penicillin, sulfa and codeine.  She develops a rash  with penicillin and in the emergency department, she is noted to have a  leukocytosis with a left shift and left lower lobe infiltrate on her chest x-  ray, and she is being admitted for further evaluation, management and  treatment of pneumonia.   PAST MEDICAL HISTORY:  1. Past medical history is significant for, as noted above, normal-pressure     hydrocephalus, status post shunt.  2.  She has a history of Wernicke's     encephalopathy by imaging studies and extensive  evaluation by Dr. Genene Churn. Love and Dr. Janalyn Shy P. Sethi.  2. History of COPD, non-O2-dependent.  3. Hyperlipidemia.  4. Depression.  5. Essential tremor.  6. History of alcohol abuse for which she stopped back in October.  7. She has continued tobacco dependent.  8. She has had a recent left ankle fracture that has been under the     management of an orthopedic surgeon, Dr. Vania Rea. Supple.   PAST SURGICAL HISTORY:  Her surgical history is significant for:  1. Hysterectomy.  2. Colon resection for what the husband describes as problems originating     from scar tissue.  3. She has had a shunt placement as well as removal.   ALLERGIES:  Allergies include SULFA, PENICILLIN and CODEINE, as noted above.   MEDICATIONS:  Medications as provided  by the husband are as follows:  1. She has a new inhaler that she does not know the name of, she was just     started on.  2. She is also on Paxil CR 25 mg daily.  3. Nexium 40 mg daily.  4. Valium 5 mg b.i.d.  5. BuSpar 7.5 mg p.o. b.i.d.  6. Aspirin 325 mg daily.  7. Phenobarbital 30 mg daily.  8. Premarin 0.625 mg p.o. daily.   SOCIAL HISTORY:  The patient is a 1-pack-per-day smoker x50 years.  She was  a former heavy drinker, however, she has stopped as of October.  She was an  Armed forces technical officer, but is now retired.  She has 1 child.  She is  married; her husband's phone number is [phone].   FAMILY HISTORY:  Family history is significant for a mother who is currently  alive at age 60 with Alzheimer's dementia.  Father died at age 32 with lung  cancer as well as subsequent finding of leukemia.   REVIEW OF SYSTEMS:  Review of systems is as follows:  She denies any weight  loss.  Fevers are noted above.  No vomiting; no hematemesis; no  hematochezia, diarrhea, abdominal pain.  Some pain in her left ankle as  related to recent fracture.  Otherwise, further review of systems are  negative except that which is stated in HPI.    PHYSICAL EXAMINATION:  VITAL SIGNS:  Her blood pressure was 98/35, heart  rate was 82, respirations 20, temperature 98% on room air.  GENERAL:  She is alert, frail-appearing, in no acute distress.  NECK:  Neck is supple.  CARDIOVASCULAR:  Exam reveals normal S1 and S2; without murmurs, rubs, or  gallop.  LUNGS:  Lung exam reveals diminished breath sounds in the left base.  Abdomen is soft and nontender.  EXTREMITIES:  Extremities reveal no edema, status post left ankle fracture  with some minimal tenderness.  NEUROLOGIC:  Exam reveals the patient to be euthymic.  Her affect is flat.  There are no focal neurologic deficits on exam.   LABORATORY DATA:  Laboratory data from the emergency room, all that are  available, are as follows:  Urinalysis which is negative.  Creatinine on the  i-STAT was 0.9, WBC of 22 with a left shift, hemoglobin 12.8 and platelet  count of 381,000.   Chest x-ray revealed left lower lobe pneumonia.   IMPRESSION AND PLAN:  1. Community-acquired pneumonia, left lower lobe, despite being on quinolone     antibiotics x1 month in the outpatient setting.  She has a history of     allergies to penicillin.  We will plan on intravenous antibiotics with     Zithromax and a trial of Rocephin, though this can possibly pose cross-     reactivity with __________ , therefore we will stop if she develops a     problem while in the hospital.  2. She has a history of chronic obstructive pulmonary disease, non-oxygen-     dependent.  3. She is deconditioned and has a left ankle fracture that is under the     management of orthopedics in the outpatient setting.   PLAN:  At this time, we are going to admit [name] to a non-  telemetry floor, follow her course clinically, follow her on a trial of  Rocephin, administer IV Zithromax, follow her white count and clinical status, hopefully ambulate her tomorrow, however, she will be maintained on  DVT  prophylaxis with  Lovenox until her functional status improves.                                                [name], D.O.    ESS/MEDQ  D:  09/05/2003  T:  09/05/2003  Job:  [id number]   cc:   Caryl Comes. Slotnick, M.D.  [address] [place]  [place] [address]  Fax: [phone]

## 2010-05-28 NOTE — H&P (Signed)
NAMESTEPHENE, ALEGRIA NO.:  [id number]   MEDICAL RECORD NO.:  [id number]          PATIENT TYPE:  IPS   LOCATION:  4028                         FACILITY:  [facility]   PHYSICIAN:  Ranelle Oyster, M.D.DATE OF BIRTH:  [birth year]   DATE OF ADMISSION:  02/11/2005  DATE OF DISCHARGE:                                HISTORY & PHYSICAL   CHIEF COMPLAINT:  Deconditioning.   HISTORY OF PRESENT ILLNESS:  This is a 77 year old white female with a  history of chronic obstructive pulmonary disease and alcohol/tobacco abuse,  admitted on January 10, 2005, with pneumonia and pleuritic chest pain.  She  was found to have a left lower lobe pneumonia, which was treated with IV  vancomycin.  The patient developed increased shortness of breath and acute  respiratory failure on January 12, 2005, which required a chest tube  placement for drainage of an empyema.  The patient required an intubation  later and was managed in the intensive care unit.  The patient developed  acute blood loss anemia and hypovolemic shock, secondary to a massive left  hemithorax in the area of the previous empyema.  The patient underwent an  emergent thoracotomy and evacuation on January 12, 2005, by Dr. Purcell Nails.  The patient had difficulty weaning from the vent.  A trach was placed  on January 17, 2005.  It was down-sized to a #4 cuff with Shiley yesterday  and has been wearing a Passy-Muir valve for speech, and tolerating this  fairly well.  The patient had positive cultures recently for Pseudomonas and  has been on Cipro since February 07, 2005.  The patient is on a regular diet  and thin liquids after a modified barium swallow on February 07, 2005.   REVIEW OF SYSTEMS:  Positive for shortness of breath, incontinence, low back  pain, some anxiety and a decreased appetite.   PAST MEDICAL/SURGICAL HISTORY:  1.  Positive for encephalopathy.  2.  Chronic obstructive pulmonary disease.  3.  Alcohol  abuse.  4.  Dementia.  5.  Depression.  6.  A hysterectomy.  7.  Essential tremor.  8.  V/P shunt placement, with removal in 2005.  9.  Questionable colon resection.   FAMILY HISTORY:  Positive for cancer.   SOCIAL HISTORY:  The patient lives with her daughter in a one-level house  with three to five steps to enter.  The daughter is an R.N. and is not  working currently and can stay with the patient at discharge.  The patient  was independent at a household level prior to arrival.   MEDICATIONS:  Prior to arrival are unknown.   ALLERGIES:  PENICILLIN, CODEINE AND SULFA.   LABORATORY DATA:  Hemoglobin 9.9, white count 10.9, platelets 485,000.  Sodium 133, potassium 3.7, creatinine level 0.7.   PHYSICAL EXAMINATION:  GENERAL:  The patient is pleasant, in no acute  distress.  VITAL SIGNS:  Blood pressure 124/84, respirations 18,, temperature 98.4  degrees.  HEENT/NECK:  The patient has a size #4 Shiley cuff with a trach in place  with a  Passy-Muir valve.  Seems to be talking well through it.  She coughed  a bit with deep breathing, and seemed to manage secretions fairly well.  The  trach site itself appeared to be intact and without discharge or drainage.  Pupils equal, round, reactive to light and accommodation.  Extraocular  eye  movements are intact.  Nose and throat exam is unremarkable.  Oral mucosa is  pink and moist.  Dentition is fair.  The neck is supple without jugular  venous distention or lymphadenopathy.  CHEST:  Notable for a large thoracotomy scar in the left upper ribcage.  It  is well-healed and somewhat scarred with mild hyper-granulation.  The chest  tube sites were clean and intact and healed.  HEART:  A regular rate and rhythm without murmurs, rubs or gallops.  ABDOMEN:  Soft, nontender.  Bowel sounds positive.  NEUROLOGIC:  Cranial nerves II-XII  intact.  Reflexes 2+.  Sensation normal.  The patient appeared to generally have good judgment, orientation,  memory  and appropriate mood with me today.  Motor function was 4/5 proximally in  the extremities and 4+/5 distally generally throughout today.   ASSESSMENT/PLAN:  1.  Functional deficit, secondary to deconditioning after a prolonged      pulmonary course, including a thoracotomy and ventilation:  Again a      comprehensive inpatient rehab with PT to assess and treat for mobility      and lower extremity strengthening.  OT for upper extremity strengthening      and ADL's.  Speech to assess for ongoing swallowing needs as needed.      Nursing will follow the patient for skin care issues.  The case manager      and Social Work to assess for psychosocial needs.  The estimated length      of stay is seven plus days.  The prognosis is good.  2.  Pain management:  Tylenol p.r.n.  3.  Deep venous thrombosis prophylaxis:  Subcutaneous Lovenox.  4.  Mild hyponatremia:  Follow up admission labs on Monday.  5.  Chronic obstructive pulmonary disease:  Continue with the trach over the      weekend.  I would like to observe over the weekend.  Begin plugging      trials tomorrow.  Consider trach removal on Monday.  6.  Bladder:  Discontinue the Foley catheter in the a.m. and begin a voiding      trial.  7.  Pseudomonas:  Continue Cipro, day 5/10.  8.  Appetite:  Add Megace 400 mg b.i.d.      Ranelle Oyster, M.D.  Electronically Signed     ZTS/MEDQ  D:  02/11/2005  T:  02/11/2005  Job:  [id number]

## 2010-05-28 NOTE — H&P (Signed)
NAME:  [name], [name]                          ACCOUNT NO.:  [id number]   MEDICAL RECORD NO.:  [id number]                   PATIENT TYPE:  INP   LOCATION:  2891                                 FACILITY:  [facility]   PHYSICIAN:  Hilda Lias, M.D.                DATE OF BIRTH:  December 08, 1932   DATE OF ADMISSION:  11/01/2002  DATE OF DISCHARGE:                                HISTORY & PHYSICAL   HISTORY:  Ms. Boyers is a lady who was seen by myself in my office about 10  days ago because of problems with memory.  This lady had been having  progressive disorders with her gait up to the point that she is falling  backwards, hitting her head on several occasions.  She also complains of  dizziness and diplopia.  The patient had a complete workup done by Dr. Sandria Manly,  including a spinal tap which, after he drained the CSF, she did improve.  She also had the diagnosis of Alzheimer's disease.  Because of the clinical  history, the improvement with the spinal tap and the CT scan, the decision  was made to go ahead with shunt to drain the CSF on the brain.   PAST MEDICAL HISTORY:  She has a history if high cholesterol, she has colon  surgery, hysterectomy, and hemorrhoidectomy.   ALLERGIES:  She is ALLERGIC  to PENICILLIN, SULFA, and CODEINE.   FAMILY HISTORY:  Father died of leukemia.  Mother had Alzheimer's.   CLINICAL HISTORY:  She drinks alcohol, three hours a day.  She smokes half a  pack a day.   PHYSICAL EXAMINATION:  GENERAL:  The patient came to my office with her  husband.  She was walking with a cane.  Her walk was with small steps and  bit wide based.  HEAD, EARS, & THROAT:  Normal.  There is a scar in the anterior part of the  ears and on the scalp probably secondary to a __________ disorder.  The  patient does not disclose this during my physical examination.  NECK:  She is able to flex and lateralize with no problems.  LUNGS:  Clear.  HEART:  Sounds normal.  ABDOMEN:  Normal.   There is a scar in the midline from previous surgery,  where she had the colectomy.  EXTREMITIES:  Normal.  NEURO:  Mental status normal.  Cranial nerves normal.  Strength normal.  Sensation normal.  Coordination normal.  Reflexes normal.   The CT scan shows that she has a dilatation of the ventricle system.   CLINICAL IMPRESSION:  1. No appreciable hydrocephalus.  2. Alzheimer's disease.   RECOMMENDATIONS:  The patient and her husband were advised by Dr. Sandria Manly to  proceed with the shunt.  I fully explained the procedure which essentially  is a tube to drain the spinal fluid from the brain to the abdomen.  She and  her  husband know about the risks of this procedure such as overdrainage with  subdural hematoma which might require surgery, nonfunctioning of the shunt,  abdominal problems with infection and also bleeding secondary to scar  tissue from previous colectomy, need of further surgery, and no improvement  whatsoever.  The [facility], and I told her that I  would be more than happy to refer to any other center in the area.  She  knows Dr. Ezzard Standing and she requested Dr. Ezzard Standing to be in the surgery which is  going to be done.                                                Hilda Lias, M.D.    EB/MEDQ  D:  11/01/2002  T:  11/01/2002  Job:  [id number]

## 2010-05-28 NOTE — Assessment & Plan Note (Signed)
[facility]                         GASTROENTEROLOGY OFFICE NOTE   NAME:STONEElbia, Paro                       MRN:          [id number]  DATE:01/09/2006                            DOB:          1932-12-20    CHIEF COMPLAINT:  Followup of bile leak.   Ms. Ventola is doing well without abdominal pain, after she was stented  for her post-cholecystectomy bile leak on December 13, 2005. She has had  some diarrhea, and now has reverted to her typical constipation.  Dr.  Corinda Gubler had given her some Miralax, but that did not seem to work.  I  think she was only taking one dose a day.  She has tried some Senna,  which has helped once or so.  She does not really have discomfort or  abdominal pain with that.   PAST MEDICAL HISTORY:  1. Recent cholecystectomy with bile leak.  2. Irritable bowel syndrome, constipation predominate.  3. History of adenomatous colon polyps (this was in 2002).  Subsequent      colonoscopy 2003 was normal.  4. Hiatal hernia.  Chronic gastritis/duodenitis on EGD April 19, 2001.  5. Prior hysterectomy.  6. Dyslipidemia.  7. Prior hemorrhoidectomy.  8. Chronic obstructive pulmonary disease and smoking.  9. Recent severe illness with pneumonia, requiring tracheostomy.  10.Low prealbumin at hospitalization.  11.Hypokalemia and mild anemia, last hospitalization with discharge      potassium of 3 and discharge hemoglobin of 10.9; MCV 87.   PHYSICAL EXAMINATION:  GENERAL:  Thin, chronically ill.  VITAL SIGNS:  Weight 118 pounds, pulse 72, blood pressure 112/64.   ASSESSMENT:  1. Post-cholecystectomy syndrome with biliary leak.  Treated with      stenting December 13, 2005.  This has been successful.  2. Chronic constipation problems.  3. History of adenomatous colon polyps.  4. Hypokalemia and anemia.   PLAN:  1. ERCP with stent removal.  Cipro 400 mg IV prior the week of January 23, 2006.  The risks, benefits and indications have  been reviewed.  2. Recheck CBC and CMET.  3. Surveillance colonoscopy would be due in April 2008.  I believe      this is in recall for Dr. Corinda Gubler.  She will transition to me, as      Dr. Corinda Gubler is retiring.  4. Further plans pending the above.  I have advised her to take      Miralax or 3 times a day; and      to use some intermittent Senna or Dulcolax.  Amitiza is a possible      option, depending upon her response to that.     Iva Boop, MD,FACG  Electronically Signed    CEG/MedQ  DD: 01/09/2006  DT: 01/09/2006  Job #: [id number]   cc:   Ernestina Penna, M.D.  Currie Paris, M.D.

## 2010-05-28 NOTE — Op Note (Signed)
NAME:  [name], [name]                          ACCOUNT NO.:  [id number]   MEDICAL RECORD NO.:  [id number]                   PATIENT TYPE:  INP   LOCATION:  3037                                 FACILITY:  [facility]   PHYSICIAN:  Hilda Lias, M.D.                DATE OF BIRTH:  [birth year]   DATE OF PROCEDURE:  11/01/2002  DATE OF DISCHARGE:                                 OPERATIVE REPORT   PREOPERATIVE DIAGNOSIS:  Normal pressure hydrocephalus.   POSTOPERATIVE DIAGNOSIS:  Normal pressure hydrocephalus.   PROCEDURES:  Insertion of right ventriculoperitoneal shunt, Codman valve,  variable.   SURGEON:  Hilda Lias, M.D.   ASSISTANT:  Hewitt Shorts, M.D.   CLINICAL HISTORY:  [name] is being admitted because of normal pressure  hydrocephalus.  The patient and her husband fully agree with the shunt.  The  risks were explained in the history and physical.   DESCRIPTION OF PROCEDURE:  The patient was taken to the OR.  After  intubation, the head and the neck were shaved and prepped with Betadine, not  only those two areas, but also the chest wall and the abdominal wall.  Drapes were applied.  Then in the head we localized the right coronal  suture.  An incision was made in front of that suture and away from the  midline.  The incision was carried down all the way down to the periosteum.  With a drill, we drilled a hole in the frontal bone.  The dural matter was  opened.  Then we made an incision in the right upper quadrant through the  skin and subcutaneous tissue through the muscle.  We found that the patient  had quite a bit of adhesions secondary to the previous surgery, but at the  end we were able to open the peritoneum in the abdominal cavity without any  problem.  Then we tunneled a passage from the abdomen all the way up to the  neck.  Insertion of the distal part of the ventriculoperitoneal shunt was  done.  Then an incision in the neck into the previous one in the  right  frontal area was done and the whole catheter was introduced.  We went ahead  and introduced the ventriculoperitoneal catheter about 6.5-7 cm from the  soft area of the skull.  Immediately CSF came out.  It was clear.  Prior to  introducing this, we programmed the valve to 120.  The whole system was put  together using _________.  At the end, we were able to depressed valve and  it was working, although slow.  Valsalva maneuver, which was done three  times, showed good flow of the CSF in the distal part of the catheter.  From  then on, the catheter was introduced in the abdominal cavity.  Having the  system in place, the scalp was closed with Vicryl and  staples.  We closed the abdominal wall in three different layers with 2-0  and 3-0 Vicryl.  The small incision in the neck was closed with staple.  At  the end because we noted that the CSF was draining slow, we went ahead and  changed the opening valve pressure from 120 to ______.  The patient is going  to go to the recovery room.                                               Hilda Lias, M.D.    EB/MEDQ  D:  11/01/2002  T:  11/01/2002  Job:  [id number]   cc:   Genene Churn. Love, M.D.  [address]  [address]  [place]  [place] [address]  Fax: [phone]

## 2010-05-28 NOTE — Discharge Summary (Signed)
NAME:  [name], [name]                          ACCOUNT NO.:  [id number]   MEDICAL RECORD NO.:  [id number]                   PATIENT TYPE:  INP   LOCATION:  3018                                 FACILITY:  [facility]   PHYSICIAN:  Genene Churn. Love, M.D.                 DATE OF BIRTH:  Apr 18, 1932   DATE OF ADMISSION:  02/01/2003  DATE OF DISCHARGE:  02/08/2003                                 DISCHARGE SUMMARY   REASON FOR ADMISSION:  This is one of several [facility] admissions  for this 77 year old, right-handed, white, married female, admitted from the  emergency room by Dr. Mal Amabile for evaluation of slurred speech and left  facial drooping.   HISTORY OF PRESENT ILLNESS:  Ms. Fuente has a complicated history.  She was  admitted to [facility] in July 2004 with symptoms of pneumonia and  at that time, was found to have evidence of altered mental status and  dementia.  She was seen in consultation by Dr. Sandria Manly, who noted that an MRI  showed atrophy and hydrocephalus.  A spinal tap in the hospital was  associated with improvement in her walking and memory.  She subsequently  underwent a normal pressure hydrocephalus treatment by Dr. Hilda Lias  with shunt placement in October 2004.  The patient clinically had  significant improvement following the LP and the shunt placement.  She did  develop the onset of diplopia with left sixth nerve palsy in September 2004.  She has been noted recently to have progressive gait disturbance and had  been seen by Dr. Jeral Fruit as an outpatient with reevaluation of her shunt by  CT scan showing no evidence of enlargement of the ventricular system.  The  day of admission, she had mental status changes.  She was found to be  confused about 2:45 p.m.  She was repeating phrases and had left facial  droop.  She was seen in the emergency room by Dr. Carren Rang and Dr.  Mal Amabile, and admitted.  A noncontrast CT scan at that time showed no evidence  of  acute or definite old infarction, and the shunt was in the right frontal  ventricle.  Ventricular size was not changed.  On admission, it was  initially thought she may have had a stroke clinically.   PAST MEDICAL HISTORY:  1. Hypertension.  2. Hyperlipidemia.  3. Depression.  4. Back pain.  5. Chronic obstructive pulmonary disease.  6. Tobacco use.  7. She has been drinking alcohol, but the exact amount is unknown.  Alcohol     has been a problem in the past, and her husband had been rationing her     alcohol.  She had also had problems with depression and anxiety and has     been on Valium 10 mg b.i.d.  8. She has had lower back pain.   ADMISSION MEDICATIONS:  1. Paxil  CR 25 mg daily.  2. Nexium 40 mg daily.  3. Premarin 0.625 mg daily.  4. Vitamin C 500 mg b.i.d.  5. Calcium with vitamin D 500 mg b.i.d.  6. Aspirin 325 mg daily.  7. Valium 10 mg b.i.d.  8. BuSpar 7.5 mg b.i.d.  9. Multivitamins once daily.  10.      Advair 25/50, 1 puff b.i.d.  11.      Senokot-S b.i.d. for constipation.   ALLERGIES:  1. PENICILLIN.  2. SULFA.  3. CODEINE.   PHYSICAL EXAMINATION AT TIME OF ADMISSION:  GENERAL:  She was awake, alert.  She had a global aphasia.  She could speak a few words, but they did not  make sense.  Her speech was not fluent.  There was no definite cranial nerve  abnormality except for left lateral rectus weakness.  She had good movement  of her upper and lower extremities and a diffuse outstretched hand and arm  tremor.   LABORATORY DATA:  Noncontrast CT scan of the brain showed no or old  infarction.  Shunt in the right frontal region.  Ventricular size unchanged.  Her admission labs revealed a urinalysis which was unremarkable.  Her drug  screen was positive for benzodiazepines and opiates.  Her  sodium was 132, potassium 4.0, chloride 94, CO2 content 29, glucose 97, BUN  5, creatinine 0.8, calcium 9.2, total protein 6.2, albumin 3.6, AST 22, ALT  19, ALP 47,  total bilirubin 0.2.  Repeat liver function tests in the  hospital were normal.  Serum sodium went to 139.  Potassium did drop to 3.0  but with replacement was 4.6.  Her CSF evaluation with shunt tap by Dr.  Jeral Fruit revealed a glucose of 69, a protein of 80.  There was 1 red blood  cell and 1 white blood cell.  Her initial CBC revealed a white blood cell  count of 9400, hemoglobin 12.8, hematocrit 90.5, and platelet count 327,000.  Her hemoglobin did drop to 10.8 in the hospital with a Hemoccult pending on  her stool.  Her MRI study of the brain showed evidence of dural thickening  and evidence of Wernicke's encephalopathy with high signal in the mamillary  bodies on the flare study.  MR angiogram of circle of Willis showed motion-  degraded study.  A single chest x-ray showed left lower lobe atelectasis.  Her catheter angiogram was performed which showed significant abnormalities.  EEG showed some no epileptiform activity and questionable mild slowing at  F8.   HOSPITAL COURSE:  The patient was admitted, found to have evidence of  Wernicke's by MRI.  Amount of alcohol patient was drinking was not really  clear.  Husband had said probably 1 bottle of alcohol total in 4 months.  Amount of Valium that she was taking was also not clear; 10 mg twice per day  for certain but also probably some other Valium as well.  She was taking  Lorcet Plus which had hydrocodone in it, accounting for the positive opiates  on her drug screen.  In the hospital, after initial evaluation did not  reveal evidence of an acute stroke, an arteriogram was unremarkable.  An MRI  showed evidence of Wernicke's.  She had been receiving thiamin, and it was  noted that she was improving.  She did, however, develop a withdrawal  syndrome with confusional state and hallucinations and inability to sleep. This was treated with her Valium which she had been on since admission and  Ativan and phenobarbital.  She had marked  improvement in her mental status  and prior to discharge, her husband said it was the best she had been since  September.  As an outpatient, she had previously had a spinal tap which  showed evidence of positive beta amyloid, 42 protein, and there is a  positive family history of Alzheimer's.   IMPRESSION:  1. Wernicke's encephalopathy, code 265.1 and 291.1.  2. Withdrawal syndrome with delirium, code 291.0 and 292.80, not clear     whether this is alcohol or polysubstance.  3. NPH by history with definite improvement with shunt, code 331.3.  4. Essential tremor, code 333.1.  5. Possible transient ischemic attack on admission with facial droop but     probably related to her Wernicke's, code 435.9.  6. Chronic obstructive pulmonary disease, code 496.  7. Depression, code 311.   PLAN:  The plan at this time is to discharge the patient on phenobarbital 30  mg b.i.d., Valium 5 mg t.i.d., Paxil CR 25 mg daily, Advair p.r.n., BuSpar  7.5 mg b.i.d., aspirin 1 daily, vitamin C 500 mg b.i.d., Premarin 0.625 mg  daily, Os-Cal with vitamin D 500 b.i.d., and Nexium 40 mg daily.  She is to  return to see me in 4 weeks and, at that point, we will begin a taper of her  Valium and BuSpar.  She is to take Tylenol for pain 15 tablets per month.                                                Genene Churn. Sandria Manly, M.D.    JML/MEDQ  D:  02/08/2003  T:  02/09/2003  Job:  [id number]   cc:   Caryl Comes. Slotnick, M.D.  [address] [place]  [place] [address]  Fax: [phone]

## 2010-05-28 NOTE — Op Note (Signed)
[name], [name]                ACCOUNT NO.:  [id number]   MEDICAL RECORD NO.:  [id number]          PATIENT TYPE:  INP   LOCATION:  2115                         FACILITY:  [facility]   PHYSICIAN:  Salvatore Decent. Cornelius Moras, M.D. DATE OF BIRTH:  19-Jan-1932   DATE OF PROCEDURE:  01/12/2005  DATE OF DISCHARGE:                                 OPERATIVE REPORT   PREOPERATIVE DIAGNOSIS:  Left hemothorax, status post chest tube placement  with preexisting left empyema.   POSTOPERATIVE DIAGNOSIS:  Left hemothorax, status post chest tube placement  with preexisting left empyema.   PROCEDURE:  Left thoracotomy for evacuation of hemothorax and drainage of  empyema.   SURGEON:  Dr. Purcell Nails.   ASSISTANT:  Jerold Coombe, P.A.   ANESTHESIA:  General.   BRIEF CLINICAL NOTE:  The patient is 77 year old female admitted with left  empyema on January1,2007. The patient has chest tube placed in the early  morning hours of January3 by the pulmonary critical care team. The patient  subsequently developed acute blood loss anemia complicated by hypovolemic  shock in the setting of ongoing sepsis. Follow-up chest CT scan demonstrated  findings consistent with massive left hemothorax. Following initial  resuscitation with blood products, the patient is now brought to the  operating room for surgical exploration. A full consultation has been  dictated previously. The patient's daughter and medical power of attorney  provides full informed consent for the procedure as described.   OPERATIVE NOTE IN DETAIL:  The patient was brought directly from the medical  intensive care unit to the operating room on the evening of January3,2007.  The patient was placed in the supine position on the operating table.  Adequate general anesthesia was verified under the care and direction of Dr.  Autumn Patty. Intravenous antibiotics were administered. A right  internal jugular central venous catheter was placed  for intravenous access.  The patient's existing arterial line was monitored for continuous blood  pressure monitoring as was a Foley catheter for urine output. The patient's  existing single-lumen endotracheal tube was replaced using a dual lumen  endotracheal tube. Reintubation was felt to be technically straightforward  and without complication. The patient is turned to the right lateral  decubitus position using the pneumatic beanbag device and axillary roll to  facilitate positioning. The existing left chest tube is removed. The  patient's left chest was prepared with Betadine solution and draped in  sterile manner.   A left posterolateral thoracotomy incision was performed. The latissimus  dorsi muscle was divided. The serratus anterior muscle is retracted  anteriorly. The left pleural space was entered through approximately the  seventh intercostal space. There is a massive amount of old blood in the  left pleural space. A portion of the seventh rib was divided to facilitate  exposure. There are a moderate amount adhesions of adhesions surrounding the  lung but the lung was easily freed from the chest wall superiorly. The only  area where the lung was not easily freed from the parietal pleura was at the  apex. The majority of the blood  was located inferiorly and over the  diaphragmatic surface. All the blood was evacuated. A total of approximately  1800 mL of blood and clot were removed and evacuated. The left chest was  irrigated with copious warm saline solution. There is some oozing  appreciated from the dense pleural adhesions associated with the patient's  existing empyema. The lung itself was acutely inflamed. However, there is  not much of fibropurulent peel on the lung surface and the lung itself  expands fairly well with reinflation. The bleeding itself appears to emanate  from oozing from these pleural adhesions. There is no sign of direct  laceration of the lung  parenchyma itself. There is no bleeding from the  chest wall in the vicinity of chest tube placement to suggest intercostal  arterial injury. There is some chest wall bleeding from adhesions that is  more of an oozing.  Ongoing oozing was controlled with electrocautery. The  left chest was again irrigated with copious warm saline solution. Once  complete hemostasis ascertained, the inferior pulmonary ligament was divided  to facilitate elevation of the left lung. The left chest was drained using a  total of three 36 French chest tubes exited through separate stab incisions.  The left thoracotomy incision was closed in multiple layers per routine and  the skin was closed with skin staples. The chest tubes were affixed to  closed suction drainage device. The patient is reintubated using a single  lumen endotracheal tube after completion of the procedure and returned to  the medical intensive care unit in stable but critical condition. It was  noted throughout the procedure that the patient had a tendency to desaturate  despite ventilation with 100% FiO2 and single lung ventilation frequently  had to be interrupted in order to maintain oxygen saturations near 90%.  There were no intraoperative complications.      Salvatore Decent. Cornelius Moras, M.D.  Electronically Signed     CHO/MEDQ  D:  01/12/2005  T:  01/13/2005  Job:  [id number]

## 2010-05-28 NOTE — Op Note (Signed)
NAME:  [name], [name]                          ACCOUNT NO.:  [id number]   MEDICAL RECORD NO.:  [id number]                   PATIENT TYPE:  INP   LOCATION:  3040                                 FACILITY:  [facility]   PHYSICIAN:  Hilda Lias, M.D.                DATE OF BIRTH:  Nov 13, 1932   DATE OF PROCEDURE:  04/01/2003  DATE OF DISCHARGE:                                 OPERATIVE REPORT   PREOPERATIVE DIAGNOSIS:  Rule out cerebrospinal fluid infection with shunt  malfunction.   POSTOPERATIVE DIAGNOSIS:  Rule out cerebrospinal fluid infection with shunt  malfunction.   OPERATION PERFORMED:  Tapping of the dome of the ventriculoperitoneal shunt.   SURGEON:  Hilda Lias, M.D.   CLINICAL HISTORY:  The patient was transferred from [facility]  because of fever with increase of white cells.  There was a possibility that  the shunt is not working according to the cardiologist.  Decision was to  obtain CSF to rule out _________ infection.   DESCRIPTION OF PROCEDURE:  The scalp on top of the shunt was prepped with  Betadine.  Having down this, a 25 needle was inserted and about 5 mL of  clear, easily flowing CSF was obtained.  The CSF is going to be sent to the  laboratory for cell count, culture, protein and glucose.  After the CSF was  removed, pressure into the area of the scalp was done for five minutes and  there was no bleeding.                                               Hilda Lias, M.D.    EB/MEDQ  D:  04/01/2003  T:  04/02/2003  Job:  [id number]

## 2010-05-28 NOTE — H&P (Signed)
NAME:  [name], [name]                          ACCOUNT NO.:  [id number]   MEDICAL RECORD NO.:  [id number]                   PATIENT TYPE:  INP   LOCATION:  1827                                 FACILITY:  [facility]   PHYSICIAN:  Pramod P. Pearlean Brownie, MD                 DATE OF BIRTH:  [birth year]   DATE OF ADMISSION:  02/01/2003  DATE OF DISCHARGE:                                HISTORY & PHYSICAL   REFERRING PHYSICIAN:  Carren Rang, M.D.   REASON FOR ADMISSION:  Stroke.   HISTORY OF PRESENT ILLNESS:  The patient is a 77 year old lady who was  brought to the emergency room by her family for sudden onset of change in  mental status.  The patient apparently was found to be confused about 2:45  p.m. today.  She was not making sense and kept repeating phrases and was not  following commands.  She was also noted to have facial drooling on one side  and some slurred speech.  She had no significant worsening of her gait or  balance.  She did not have any significant headache, vomiting, seizure, or  loss of consciousness.  She has a past neurological history of normal  pressure hydrocephalus for which she has undergone ventriculoperitoneal  shunt in October of last year.  She had significant gait and balance  problems prior to the surgery along with some bladder urgency.  These  symptoms improved quite significantly following the surgery for a couple of  months.  However for the last few months, they have noticed some worsening  of her gait and balance again.  She has been evaluated by Dr. Jeral Fruit as an  outpatient and had shunt series x-rays done this Tuesday, the results of  which are not available to the family.  She had chronic headache even before  the shunt surgery which did not improve, and there has been no recent  worsening of her headache.  There has been no recent fever, cough, cold,  diarrhea, or other infection.  The patient has no prior history of stroke;  however, she has vascular risk  factors of hypertension and hyperlipidemia,  and she is a smoker.   PAST MEDICAL HISTORY:  1. Hypertension.  2. Hyperlipidemia.  3. Depression.   HOME MEDICATIONS:  1. Paxil CR 25 mg a day.  2. Nexium 40 mg a day.  3. Premarin 0.625 mg daily.  4. Vitamin C 500 mg twice a day.  5. Calcium with vitamin D 500 mg twice a day.  6. Aspirin 325 mg daily.  7. Valium 5 mg twice daily as needed.  8. BuSpar 7.5 mg twice daily.  9. Multivitamins once a day.  10.      Advair 250/50 one puff twice a day.  11.      Senokot S twice daily for constipation.   ALLERGIES:  PENICILLIN, SULFA, and CODEINE.   FAMILY  HISTORY:  Significant for __________ in mother.   SOCIAL HISTORY:  The patient lives with her family.  She smokes one-half  pack per day.  She does drink some alcohol every day.   REVIEW OF SYSTEMS:  Not significant for recent fever, loss of weight, cough,  diarrhea, chest pain.  She has had some recent worsening of her gait and  balance as stated earlier.   PHYSICAL EXAMINATION:  GENERAL:  Pleasant, elderly lady sitting up in bed.  VITAL SIGNS:  She is afebrile.  Pulse rate 78 per minute. Blood pressure  180/88, respiratory rate 18 per minute.  Distal pulses well heard.  HEENT:  Head is traumatic and shows reservoir from V-P shunt on the right  frontal region with underlying craniotomy bony defect.  The shunt is  palpated on the right side of the neck.  CARDIAC:  No murmur or gallop.  LUNGS:  Clear to auscultation.  ABDOMEN:  Soft, nontender.  NEUROLOGIC:  The patient is awake, alert.  She has global aphasia.  She  speaks few words which do not make sense.  She repeats phrases.  She follows  only simple occasional one-line commands.  She cannot follow two-step  commands.  Speech is not fluent.  Eye movements are full range to tracking.  She blinks to visual threat on the left but not on the right side.  There is  some restriction of left lateral gaze.  The patient has some  subjective  diplopia as per the family.  She uses an eye patch, and this proceeds her  shunt surgery.  There is facial asymmetry with some weakness of the right  nasolabial fold compared to the left.  Tongue is midline.  Motor system exam  reveals no upper extremity drift.  She has symmetric antigravity strength in  all four extremities, and she is not cooperative for detailed muscle  testing.  Deep tendon reflexes are 2+ symmetric.  Both plantars are  downgoing.  Coordination and gait cannot be reliably tested.   LABORATORY DATA:  Noncontrast CT scan of the head done today in the  emergency room reveals no evidence of acute or old infarction.  There is  shunt noted in the right frontal pons.  Ventricular size does not appears to  be significantly changed from previous CT scan from October last year.   Admission labs are pending.   IMPRESSION:  A 77 year old lady with known history of normal pressure  hydrocephalus with sudden onset of mental status change consisting of  aphasia without hemiparesis, possibly from left middle cerebral artery  inferior division infarction.   PLAN:  The patient is admitted to the stroke service for further risk  stratification workup.  The patient presented within 3 to 6 hours from onset  of symptoms, and she will not qualify for IV thrombolysis.  However, she may  be a candidate for intra-arterial catheter-based rescue.  She will not  qualify for the SAINT II neuroprotective stroke study, and she does not have  any significant hemiparesis.   I have discussed the risks, benefits of acute intra-arterial catheter-based  treatment with the patient's husband and daughter, and they are in  agreement.  Will call Dr. Kerby Nora for performing emergent cerebral  catheter angiogram.  If a large occlusion is found, may consider catheter-  based therapy.  Will also continue supportive medical management.  Will check UA for evidence of infection as well as  consider physical,  occupational, and speech therapies later.  I had a long discussion with the patient's husband and daughter about her  symptoms, discuss plans for evaluation, and answered questions.  Total time  spent in direct patient care as well as directing other matters was 80  minutes.  If all above evaluations are negative, may consider referral to  Dr. Jeral Fruit to evaluate shunt functioning.  She was planning to do that as an  outpatient.                                                Pramod P. Pearlean Brownie, MD    PPS/MEDQ  D:  02/01/2003  T:  02/01/2003  Job:  [id number]   cc:   Carren Rang, M.D.

## 2010-05-28 NOTE — Assessment & Plan Note (Signed)
[facility]                         GASTROENTEROLOGY OFFICE NOTE   NAME:STONEEarle, Burson                       MRN:          [id number]  DATE:01/09/2006                            DOB:          1932/10/05    No dictation.     Iva Boop, MD,FACG     CEG/MedQ  DD: 01/09/2006  DT: 01/09/2006  Job #: [id number]   cc:   Currie Paris, M.D.

## 2010-05-28 NOTE — Discharge Summary (Signed)
NAME:  [name], [name]                          ACCOUNT NO.:  [id number]   MEDICAL RECORD NO.:  [id number]                   PATIENT TYPE:  INP   LOCATION:  3021                                 FACILITY:  [facility]   PHYSICIAN:  Asencion Partridge, M.D.                  DATE OF BIRTH:  05/22/1932   DATE OF ADMISSION:  08/05/2002  DATE OF DISCHARGE:  08/12/2002                                 DISCHARGE SUMMARY   DISCHARGE DIAGNOSES:  1. Pneumonia.  2. Altered mental status and dementia.  3. Unsteady gait.  4. Depression.  5. Tobacco abuse.  6. Chronic obstructive pulmonary disease.   DISCHARGE MEDICATIONS:  1. Paxil CR 25 mg 1 p.o. q.d.  2. Nexium 40 mg 1 p.o. q.d.  3. Premarin 0.625 mg 1 p.o. q.d.  4. Vitamin C 500 mg 1 p.o. b.i.d.  5. Calcium plus vitamin D 500 mg 1 p.o. b.i.d.  6. Aspirin 325 mg 1 p.o. q.d.  7. Valium 5 mg 1 p.o. b.i.d. p.r.n. anxiety.  8. Buspar 7.5 mg 1 p.o. b.i.d.  9. Multivitamin 1 p.o. q.d.  10.      Avalox 500 mg 1 p.o. q.d. for 7 days.  11.      Senokot S 1 p.o. b.i.d. p.r.n. constipation.  12.      Advair 250/50, 1 puff b.i.d.  13.      Nicoderm 14 mg, 1 patch q.d.   DISPOSITION:  1. Dr. Sandria Manly at [facility] - phone number [phone], on Friday,     September 06, 2002 at 2:30 p.m.  2. Cecilie Kicks of [facility] - phone number [phone].  They will call the patient with an appointment.  3. Dr. Alethia Berthold at [facility] at phone [phone],     Monday, August 19, 2002 at 10 a.m.   PROCEDURES PERFORMED:  Include:  1. Lumbar puncture.  2. A head CT showing atrophy and small-vessel disease, two bilateral     infarcts.  3. No definite acute infarction or hemorrhage.  4. A chest x-ray showing bilateral left lower lobe infiltrate, right greater     than left, suggesting pneumonia.   CONSULTATIONS:  1. Dr. Sandria Manly with [facility].  2. Physical therapy and occupational therapy.  3. Social work.  4. Rehab.   HISTORY:  Upon admission to [facility], this 77 year old female,  with history of emphysema, depression, allergies, anxiety, presents to the  emergency room after approximately six months decline, where she began  falling and talking out of her head.  She was injured approximately two to  three months, prior to admission, after a fall.  She went to an ear, nose  and throat physician without improvement in her symptoms.  The symptoms were  initially thought to be related to sinuses, but her symptoms have worsened  in the two to  three weeks prior to admission.  She had an MRI of the brain  in July 2004, which showed atrophy and hydrocephaly.  The day of admission,  she is still slow and falling at approximately 4 p.m.  The day of admission,  she fell asleep, and she was unable to be awakened, and her family could  not get her to do any of her normal activities.  She was also trembling on  her left side.  She complained of a headache and generalized body aches, and  the left side of her mouth was drooping, and she was not opening her left  eye.  She also complained of bilateral temporal pain the night before  admission.  The family has noted a slow shuffling gait.  She is also  complaining of nausea, weakness and cough.  No vomiting or diarrhea is  noted.   LABORATORIES UPON ADMISSION:  Arterial blood gases, pH 7.408, pCO2 47.3,  bicarb 30, pCO2 of 31.  CBC:  White count of 11.5, hemoglobin 11.4,  hematocrit 33.4, platelets of 331, neutrophils 66%, lymphocytes 22%,  monocytes 50%, eosinophils 4%, basophils 1%.  It was noted that she had  increased bands.  Basic metabolic panel:  Sodium 139, potassium 3.5,  chloride 103, bicarb 31, BUN 7, creatinine 0.7, glucose 105.  Total  bilirubin 0.7, alk phos 47, SGOT 28, SGPT 18, total protein 6.8, albumin  3.7, calcium 9.0.  TSH was 1.067.  Vitamin B12 was within normal limits at  731.  Vitamin B12 was also within normal limits  at 0.9.  A urinalysis was  negative.  An RPR was nonreactive.   HOSPITAL COURSE:  1. PNEUMONIA:  The patient was noted on chest x-ray at the time of admission     to have bilateral lower lobe infiltrates.  She also had an elevated white     count and was febrile.  She was started on antibiotics, and as she became     afebrile was changed to p.o. Avalox.  She will continue on this for 7     days.  After her admission to the hospital, her white count improved, and     she was afebrile during the course of her admission, and her lung exam     improved greatly.  2. ALTERED MENTAL STATUS AND DEMENTIA:  It was initially felt that these     were neurologic signs; a transient ischemic attack could not be excluded,     but the symptoms of focal neurologic signs resolved.  A neurologic     consult was obtained after further history revealed that the patient had     several months of unsteady gait, altered mental status, and also some     history of urinary incontinence.  A lumbar puncture was performed by Dr.     Avie Echevaria, and the patient improved somewhat after this lumbar puncture.     The results of the CSF testing were no white cells, no red cells, no     segmented neutrophils, few lymphocytes, rare monocytes, no eosinophils,     no other cells.  Glucose within normal limits at 55, total protein within     normal limits at 41.  VDRL was nonreactive.  Cryptococcal antigen tests     on the CSF was also negative.  Dr Sandria Manly has ordered a number of other     tests on the CSF fluid, including __________ 14-3-3 beta isoform, which     tests  are still pending.  These should be followed with Dr. Sandria Manly at her     visit with him.  3. UNSTEADY GAIT:  Physical therapy and occupational therapy were ordered.     After her lumbar puncture, her gait improved a great deal, and physical     therapy and occupational therapy were continued throughout her stay in    the hospital.  Outpatient rehabilitation was also  ordered, as there was     concern about her unsteady gait.  4. DEPRESSION:  Patient was continued on her Paxil.  She was also started on     Buspar and continued on a small dose of her Valium that she had taken at     home.  It is hoped that she will be able to come off of her Valium and     continue just on the Buspar, as the Valium has such a long half-life in     older people.  5. TOBACCO ABUSE:  The patient was started on nicotine patches and was able     to refrain from smoking during her stay in hospital.  She will be     continued on nicotine patches as an outpatient in attempt to help her to     quit smoking.  6. CHRONIC OBSTRUCTIVE PULMONARY DISEASE:  The patient was started on     albuterol and Atrovent nebs in the hospital.  On the day of admission,     she was provided with a prescription for Advair Diskus to take.  It was     explained that this Diskus was for her lungs and not for her sinuses as     she previously believed.   DISCHARGE LABORATORIES:  Upon discharge, her labs were - CBC:  White count  7.7, hemoglobin 10.6, hematocrit 31.1, platelets of 379.  A differential was  within normal limits.  Basic metabolic panel shows sodium 136, potassium  3.5, chloride 101, bicarb 29, BUN 7, creatinine 0.7, glucose of 90, calcium  of 9.1.  It was suggested that Dr. Sandria Manly follow up on a test he performed on  the patient's CSF fluid as an outpatient.      Ursula Beath, MD                     Asencion Partridge, M.D.    JT/MEDQ  D:  08/12/2002  T:  08/13/2002  Job:  [id number]   cc:   Love, M.D.  [facility]   Sw[place], M.D.  [facility]

## 2010-05-28 NOTE — Procedures (Signed)
TECHNICIAN:  Karsten Fells.   ATTENDING PHYSICIAN:  Pramod P. Pearlean Brownie, M.D.   ELECTROENCEPHALOGRAPHY PROTOCOL:  This is a portable EEG recording for a  patient in intensive care.  The patient is described as awake, right-handed.  Hyperventilation and photic stimulation were deferred.  This is a 17-channel  EEG recording with 1 channel representing heart rate and rhythm.  The  International 10/20 placement system of electrodes was chosen to represent  the study.   HISTORY:  [age]-year-old __________  lady with sudden onset of  confusion, speech disturbance, disorientation.   PAST MEDICAL HISTORY:  Past medical history of:  1. Hypertension.  2. Hyperlipidemia.  3. Normal-pressure hydrocephalus with shunt placement, October 2002.   MEDICATIONS:  Paxil, Nexium, aspirin, multivitamin, Valium, BuSpar, Advair  and vitamin C.   ALLERGIES:  Allergic to PENICILLIN, CODEINE and SULFA.   PRIMARY MEDICAL DOCTOR:  Caryl Comes. Slotnick, M.D., [facility].   DESCRIPTION:  A posterior dominant background is not establishable due to  movement artifact and electrode failure.  EMG artifact reaches the temporal  and parietal posterior regions on the right hemisphere.  The left hemisphere  shows a posterior rhythm of 6 Hz.  The record appears to become better  established once electrode artifacts are corrected by the technician present  during the study.  It is then that I can appreciate the extent of the  movement artifact.  I cannot detect any true epileptiform discharges due to  relaxation failure of the participating patient.    Melvyn Novas, M.D.   ZO:XWRU  D:  24/01/2003 18:16:47  T:  25/01/2003 07:12:48  Job #:  [id number]   cc:   Caryl Comes. Slotnick, M.D.  [address] [place]  [place] [address]  Fax: [phone]

## 2010-05-28 NOTE — Op Note (Signed)
[name], [name]                ACCOUNT NO.:  [id number]   MEDICAL RECORD NO.:  [id number]            PATIENT TYPE:   LOCATION:                                 FACILITY:   PHYSICIAN:  Salvatore Decent. Cornelius Moras, M.D.      DATE OF BIRTH:   DATE OF PROCEDURE:  01/27/2005  DATE OF DISCHARGE:                                 OPERATIVE REPORT   PREOPERATIVE DIAGNOSIS:  Ventilator dependent respiratory failure.   POSTOPERATIVE DIAGNOSIS:  Ventilator dependent respiratory failure.   PROCEDURE:  Tracheostomy.   SURGEON:  Salvatore Decent. Cornelius Moras, M.D.   ANESTHESIA:  General   BRIEF CLINICAL NOTE:  The patient is a 77 year old female with longstanding  chronic obstructive pulmonary disease, tobacco abuse, and alcohol use who  was admitted to the hospital on January 3 with left-sided pneumonia  complicated by left empyema. The patient ultimately underwent left  thoracotomy for evacuation of massive hemothorax as well as drainage of  empyema. The patient's subsequent hospital course has been notable for  ventilator dependent respiratory failure. An attempt at extubation was  performed, but the patient had to be reintubated with continued respiratory  failure. Tracheostomy placement is felt to be indicated for further long-  term management of her underlying chronic respiratory insufficiency and  ventilator-dependent state.   OPERATIVE CONSENT:  The patient's daughter has been counseled at length  regarding the indications, risks, and potential benefits of tracheostomy  placement. Alternative strategies have been discussed. She specifically  understands and accepts all associated risks of surgery; and desires that we  proceed as described.   OPERATIVE NOTE IN DETAIL:  The patient is brought directly from the medical  intensive care unit to the operating room on the afternoon of January 18,  and placed in the supine position on the operating table. Adequate general  endotracheal anesthesia is verified,  under the care and guidance of Dr. Diamantina Monks. The patient is positioned on the table with her neck gently extended,  and a roll between her shoulders. The patient's anterior neck and upper  chest are prepared and draped in a sterile manner.   A small transverse cervical incision is made approximately two  fingerbreadths above the sternal notch. The incision is completed through  the platysma muscle with electrocautery. The strap muscles are divided in  the midline. The anterior surface of the trachea is identified and easily  approached. The existing endotracheal tube cuff is deflated. A transverse  incision is made in the trachea between the second and third tracheal rings.  This incision is then created with a T by dividing the third ring  inferiorly. Then 2-0 Prolene sutures are placed through each half of the T  for subsequent retraction. An 8.0, regular-cuffed, Shiley, tracheostomy tube  is placed under direct vision into the trachea. The cuff is inflated and the  tracheostomy hooked up to the ventilator. Carbon dioxide gas return is  verified. The tracheostomy is secured around the neck; and a sterile  dressing was applied.   The patient has tolerated the procedure well; and is transported  back to the  medical intensive care unit in stable condition. There are no intraoperative  complications.      Salvatore Decent. Cornelius Moras, M.D.  Electronically Signed     CHO/MEDQ  D:  01/27/2005  T:  01/27/2005  Job:  [id number]

## 2010-05-28 NOTE — Op Note (Signed)
   NAME:  [name], [name]                          ACCOUNT NO.:  [id number]   MEDICAL RECORD NO.:  [id number]                   PATIENT TYPE:  INP   LOCATION:  3021                                 FACILITY:  [facility]   PHYSICIAN:  Genene Churn. Love, M.D.                 DATE OF BIRTH:  Aug 01, 1932   DATE OF PROCEDURE:  08/09/2002  DATE OF DISCHARGE:                                 OPERATIVE REPORT   This was a gait study and LP for evaluation of the possibility of normal-  pressure hydrocephalus.   The patient was walked pre- and post-LP with a 12-1/2 foot distance between  two chair starting in the sitting position.  She could perform this six  times with five turns in 1 minute 18 seconds before LP and 1 minute and no  seconds after LP.   PROCEDURE NOTE:  The patient was prepped and draped in the usual manner.  Using Betadine and 1% Xylocaine, the L4-5 interspace was entered without  difficulty.  The opening pressure was 120 mmH2O, and clear, colorless CSF  was obtained, Thirty-two milliliters was withdrawn and sent for studies,  including protein, glucose, cryptococcal antigens,VDRL, tau and beta  amyloid, 42 protein, and 14-3-3 protein.  The patient tolerated the  procedure well.                                               Genene Churn. Sandria Manly, M.D.    JML/MEDQ  D:  08/09/2002  T:  08/10/2002  Job:  [id number]

## 2010-05-28 NOTE — H&P (Signed)
[name], [name]                ACCOUNT NO.:  [id number]   MEDICAL RECORD NO.:  [id number]          PATIENT TYPE:  INP   LOCATION:  0101                         FACILITY:  [facility]   PHYSICIAN:  Kela Millin, M.D.DATE OF BIRTH:  06/26/1932   DATE OF ADMISSION:  01/10/2005  DATE OF DISCHARGE:                                HISTORY & PHYSICAL   PRIMARY CARE PHYSICIAN:  Chiropodist [phone] [facility]).   CHIEF COMPLAINT:  Left-sided pleuritic pain.   HISTORY OF PRESENT ILLNESS:  The patient is a 77 year old white female with  a past medical history significant for COPD, tobacco abuse, and per family  recently diagnosed with pneumonia by her primary care physician and started  on antibiotics of Biaxin three weeks ago, who presents with the above  complaints, as well as a productive cough x3 weeks.  The patient states that  initially she was coughing up some blood tinged sputum initially but that  quit and she has had a cough productive of whitish sputum, the left-sided  pleuritic pain, and also subjective fevers x1 day.  She denies nausea and  vomiting, hematemesis, dysuria, diarrhea, constipation, melena, and no  hematochezia.   The patient was seen in the ER and a chest x-ray showed left lower lobe  consolidation/atelectasis with effusion and she was also febrile with an  elevated white cell count.  She is admitted to the [facility]  service for further evaluation and management.   PAST MEDICAL HISTORY:  1.  As stated above.  2.  History of Wernicke's encephalopathy.  3.  History of alcohol abuse with DTs.  4.  History of normal pressure hydrocephalus - status post shunt removal      secondary to infection in 2005.  5.  History of dementia.  6.  History of hyperlipidemia.  7.  History of depression.  8.  History of essential tremor.   PAST SURGICAL HISTORY:  1.  Status post hysterectomy.  2.  Question of status post colon resection surgery.   MEDICATIONS:  1.  Valium 10 mg p.o. b.i.d.  2.  BuSpar 7.5 mg b.i.d.  3.  Nexium.  4.  Premarin.  5.  Aspirin.  6.  Remeron.  7.  Phenobarbital 30 mg daily.  8.  Thiamine.  9.  Dyazide.  The above medications are as per the patient's old discharge summary and her  daughter, as the patient cannot remember all of her medications.   ALLERGIES:  1.  CODEINE.  2.  PENICILLIN.  3.  SULFA.   SOCIAL HISTORY:  Positive for tobacco.  Also positive for alcohol/family.  The patient's alcohol intake that they are aware of was Christmas Day,  although they state they are not really sure that she has not been drinking  since then and the patient does not give any further information regarding  her alcohol use.  Also per daughter the patient has had DTs during her  previous hospitalizations.   FAMILY HISTORY:  Her mother had Alzheimer's dementia.  Father had a history  of leukemia and lung cancer.   REVIEW  OF SYSTEMS:  As per HPI.  Also the patient has had urinary and stool  incontinence for the most part over the past 2-3 years, beginning just about  the time she was diagnosed with NPH.   PHYSICAL EXAMINATION:  GENERAL:  The patient is an elderly white female,  awake but intermittently falling asleep during the interview and most of the  questions/talking per family.  No respiratory distress with nasal cannula O2  on.  VITAL SIGNS:  Her temperature is 102.5, blood pressure 120/60, with a pulse  of 117.  Respiratory rate 16, O2 saturation 95%.  HEENT:  PERRL.  EOMI.  Slightly dry mucous membranes.  Sclerae anicteric.  No oral exudates.  NECK:  Supple.  No adenopathy and no thyromegaly.  LUNGS:  Decreased breath sounds bilaterally.  Also bronchial breath sounds  in the left lower lung field.  No wheezes are present.  CARDIOVASCULAR:  Tachycardiac, regular.  Normal S1 and S2.  ABDOMEN:  Soft.  Bowel sounds present.  Nontender and nondistended.  No  organomegaly.  No masses palpable.   EXTREMITIES:  Trace edema.  NEUROLOGIC:  Sleepy but easily aroused.  Nonfocal exam.   LABORATORY DATA:  A chest x-ray with left lower lobe  consolidation/atelectasis with effusion.  Her white cell count is 18.1,  hemoglobin is 13.4, hematocrit of 40.5, platelet count is 598.  Her pH is  7.4, pCO2 of 39, pO2 of 76.8, O2 saturation of 95.4%.  Her sodium is 133  with a potassium of 4.2, chloride is 100, CO2 is 24, glucose is 118, BUN of  10, creatinine is 0.7, calcium is 8.9.   ASSESSMENT/PLAN:  1.  Left lower lobe pneumonia.  We will obtain blood cultures, empiric      antibiotics, and antitussives.  2.  Chronic obstructive pulmonary disease.  Nebulized bronchodilators,      antibiotics, Advair added, and also supplemental oxygen.  The patient is      counseled to quit tobacco.  3.  Tobacco abuse.  As above the patient is consulted to quit tobacco.  4.  History of Wernicke's encephalopathy.  5.  History of alcohol abuse.  Multivitamins, thiamine, and benzodiazepines.  6.  History of normal pressure hydrocephalus - status post shunt removal      secondary to infection.           ______________________________  Kela Millin, M.D.     ACV/MEDQ  D:  01/10/2005  T:  01/10/2005  Job:  [id number]

## 2010-05-28 NOTE — Assessment & Plan Note (Signed)
[facility]                           GASTROENTEROLOGY OFFICE NOTE   NAME:[name], [name]                       MRN:          [id number]  DATE:11/07/2005                            DOB:          20-Jun-1932    [name] comes in because of some lower abdominal  pain.  She sees Dr.  Lowanda Foster and goes to [facility].  She is in a lot of  abdominal pain.  It has been bothering her over the past 3 to 4 months.  With more specific questioning, she indicated it was more right upper  quadrant.   She was quite sick in January with pneumonia and was put in the hospital and  had a trach, and on the CT scan, gallstones were detected, but she was not  informed of this or did not remember it in the discussion, but she was quite  sick, was in the hospital for quite some time.  She comes in now with just  recurrent symptoms.  Her other GI symptoms include some occasional  swallowing trouble ever since she had the trach and some heartburn.  She  also had some constipation and irritable bowel syndrome and has had known  history of small colon polyp in the past.  She is not scheduled for a  colonoscopic examination for another year.   PAST MEDICAL HISTORY:  Sadly above, reveals hyperlipidemia, some depression,  some sinus trouble.  She has had a hemorrhoidectomy, and she has had a  hysterectomy.   SOCIAL HISTORY:  Noncontributory.   REVIEW OF SYSTEMS:  Reveals some shortness of breath, some leakage of urine,  occasional blood in the urine as well, some frequent cough, swelling in her  feet at time and allergies as noted.   FAMILY HISTORY:  Reveals another family member with colon polyps.   PHYSICAL EXAMINATION:  GENERAL:  She looks good at this point in time.  VITAL SIGNS:  She is 5 feet 7 inches, weighs 121, blood pressure 138/90,  pulse 80 and regular.  NECK, ABDOMEN AND EXTREMITIES:  Basically unremarkable except for some  slight discomfort on  the right upper quadrant on palpation.   IMPRESSION:  1. Status post recent severe illness with pneumonia.  2. Several months of right upper quadrant discomfort, with no previous      findings of gallstones, possible as an etiology.  3. History of colon polyps and irritable bowel syndrome, more of a      constipation component.  4. Status post hysterectomy.  5. Hyperlipidemia.  6. Status post hemorrhoidectomy.  7. Probably chronic obstructive pulmonary disease.  The patient continues      to smoke.   RECOMMENDATIONS:  Is the patient get routine labs with a sed rate, get an  ultrasound of her abdomen, have her referred to Dr. Cicero Duck for  surgical consultation, although she is not really interested in having  surgery unless it is definitely November 07, 2005 necessary, which I quite  understand, and I told her to try some MiraLax, which we gave her some  samples, and I advise her to  continue her Nexium, and I gave her some  samples of this as well.  I told her that she did not need the colonoscopic  examination, I did not think as yet.  I think that what she was really  concerned about, until we get through this other process and find out where  her right-sided abdominal pain was coming from.    ______________________________  Ulyess Mort, MD    SML/MedQ  DD: 11/07/2005  DT: 11/08/2005  Job #: [id number]

## 2010-05-28 NOTE — Discharge Summary (Signed)
NAME:  [name], [name]                          ACCOUNT NO.:  [id number]   MEDICAL RECORD NO.:  [id number]                   PATIENT TYPE:  INP   LOCATION:  5159                                 FACILITY:  [facility]   PHYSICIAN:  Lonia Blood, M.D.                   DATE OF BIRTH:  03-06-1932   DATE OF ADMISSION:  09/04/2003  DATE OF DISCHARGE:  09/08/2003                                 DISCHARGE SUMMARY   PRIMARY CARE PHYSICIAN:  Dr.  Caryl Comes. Slotnick, [facility]   DISCHARGE DIAGNOSES:  1.  Community-acquired pneumonia of left lower lobe.  2.  Chronic obstructive pulmonary disease.  3.  History of normal pressure hydrocephalus with __________.  4.  Dyslipidemia.  5.  Depression.  6.  Essential tremor.  7.  Alcohol dependence.  8.  History of alcohol abuse.   DISCHARGE MEDICATIONS:  1.  Paxil 25 mg daily.  2.  Protonix 80 mg daily.  3.  Buspirone 7.5 mg b.i.d.  4.  Phenobarbital 30 mg daily.  5.  Aspirin 325 mg daily.  6.  Atrovent inhaler.  7.  Multivitamin one daily.  8.  Vitamin C 500 mg daily.  9.  Tessalon cough syrup as needed t.i.d.  10. Azithromycin 500 mg daily.  11. Tramadol 50 mg t.i.d. as needed.   DISPOSITION:  Patient is being discharged in good health pending a bowel  movement this morning.  She needs to be followed up by her primary care  physician within the next one to two weeks.   BRIEF HISTORY AND PHYSICAL:  Please see full history and physical.  Ms.  Duba was admitted on September 04, 2003 to [place] unassigned.  She is a pleasant  77 year old female with history of alcohol abuse, tobacco dependence, as  well as normal pressure hydrocephalus.  She also has history of COPD.  Patient came in with cough, fever of 102, and also weakness.  She was found  to have a left lower lobe pneumonia that was thought to be community  acquired.  She has been on antibiotics prior to that for almost one month in  the outpatient setting.  Patient is allergic  to penicillin so she was  started on Zithromax.  She was admitted for workup.   HOSPITAL COURSE:  #1 - LEFT LOWER LOBE PNEUMONIA:  Patient was started on  Zithromax and Rocephin IV.  She defervesced almost immediately and at the  time of discharge patient is doing well and has improved tremendously.   #2 - CHRONIC OBSTRUCTIVE PULMONARY DISEASE:  Patient has history of COPD.  However, she did not go into acute COPD exacerbation during this  hospitalization.  She had a new inhaler at home, apparently, that she did  not know what it was but in the hospital she was given albuterol and  Atrovent nebulizers which she did very well with.   #  3 - GASTROESOPHAGEAL REFLUX DISEASE:  Patient was taking Nexium 40 mg daily  at home.  While in the hospital she was placed on Protonix and she did very  well.  She was discharged to continue with her home medicine. In addition,  put her back on the Nexium if needed by her primary care physician.   #4 - DEPRESSION:  Patient has been taking BuSpar for her depression.  We did  not make any changes to her medication while in the hospital.   #5 - HYPOKALEMIA:  Patient had some bouts of hypokalemia probably related to  her albuterol use which was replaced prior to this discharge.   #6 - CONSTIPATION:  On the day of discharge patient complained of ongoing  constipation that has not responded to initial treatment.  Patient was given  milk of magnesium until she has a bowel movement and this discharge is  effective on her having a bowel movement prior to discharge.  There is no  evidence of __________ impaction, however.   #7 - TOBACCO DEPENDENCE:  Patient has history of tobacco dependence for  which she has been counseled several times.  However, while in the hospital  she did not have any significant problem.                                                Lonia Blood, M.D.    Verlin Grills  D:  09/08/2003  T:  09/08/2003  Job:  [id number]   cc:   Caryl Comes.  Slotnick, M.D.  [address] [place]  [place] [address]  Fax: [phone]

## 2010-05-28 NOTE — Discharge Summary (Signed)
NAMEARICELA, BERTAGNOLLI                ACCOUNT NO.:  [id number]   MEDICAL RECORD NO.:  [id number]          PATIENT TYPE:  INP   LOCATION:  2399                         FACILITY:  [facility]   PHYSICIAN:  Lacretia Leigh. Hatcher, M.D.DATE OF BIRTH:  Mar 25, 1932   DATE OF ADMISSION:  01/12/2005  DATE OF DISCHARGE:  02/11/2005                                 DISCHARGE SUMMARY   DISCHARGE DIAGNOSES:  1.  Pneumonia.  2.  Severe sepsis.  3.  Chronic obstructive pulmonary disease exacerbation.  4.  Ventilator-dependent respiratory failure.  5.  Emphysema.  6.  Alcohol abuse.  7.  Empyema.  8.  Hemothorax secondary to chest tube insertion.  9.  Tobacco abuse.  10. Wernicke's encephalopathy.  11. Dementia.  12. Depression.  13. Dyslipidemia.  14. Deconditioning secondary to prolonged intensive care unit stay.  15. Normal-pressure hydrocephalus.  16. Pseudomonas tracheobronchitis.  17. Anxiety disorder.  18. Hysterectomy.  19. Colon resection.   DISCHARGE MEDICATIONS:  1.  Ferrous sulfate 325 mg p.o. b.i.d.  2.  Senokot two tablets p.o. q.h.s.  3.  Nexium 40 mg p.o. daily.  4.  BuSpar 10 mg p.o. b.i.d.  5.  Zyrtec 10 mg p.o. daily.  6.  Flonase p.r.n.  7.  Albuterol p.r.n.  8.  Remeron half tablet p.o. q.h.s. x1 week, then increase to one tablet      p.o. daily.  9.  Advair one puff inhaled b.i.d.  10. Valium p.r.n.   CONDITION ON DISCHARGE:  Ms. Manfredonia was discharged to inpatient rehab on  February 11, 2005.  Care was assumed by Ranelle Oyster, M.D.   HOSPITAL FOLLOW-UP:  To be performed by the patient's primary care physician  at [facility]   BRIEF HISTORY AND PHYSICAL:  Ms. Bacha is a 77 year old white woman with a  past medical history of severe COPD, tobacco abuse, alcohol abuse, who  presented to [facility] January 10, 2005, with left-sided  pleuritic chest pain and febrile illness with a diagnosis of left lower lobe  with left empyema.   Following left chest tube insertion, the patient  developed hemothorax and hypovolemic shock, was resuscitated and transferred  to [facility] (please see dictated H&P for further details).   HOSPITAL COURSE:  Ms. Bossard was initially admitted from [facility]  following complications with left chest tube insertion, which was performed  for drainage of an empyema (please see HPI).  The patient suffered a left  hemothorax and hypovolemic shock.  The patient underwent a left thoracotomy  and drainage on January 12, 2005.  She had a prolonged intensive care unit  stay requiring pressor support.  A tracheostomy for ventilator-dependent  respiratory failure was performed on January 27, 2005.  She was subsequently  weaned from the ventilator on January 31, 2005.  The tracheostomy was  subsequently downsized and the patient was transferred to inpatient rehab on  February 11, 2005.  On transfer, the patient was stable, able to get a [employer] valve, and condition only noteworthy for a positive culture for  Pseudomonas of her  tracheostomy.  This was of questionable significance, but  she was discharged on Cipro for management.   DISCHARGE LABS AND VITALS:  Temperature 98.3, heart rate 80, respirations  20, blood pressure 140/70, saturation 97% on 28% on a trach collar.  White  blood cells 10.7, hemoglobin __________ and platelets 530.  Sodium 133,  potassium 4.1, chloride 98, CO2 20, glucose __________, BUN 17, creatinine  0.7, calcium 8.7.      Sharin Mons, M.D.    ______________________________  Lacretia Leigh. Ninetta Lights, M.D.    WC/MEDQ  D:  04/07/2005  T:  04/08/2005  Job:  [id number]

## 2010-05-28 NOTE — Consult Note (Signed)
[name], [name]                ACCOUNT NO.:  [id number]   MEDICAL RECORD NO.:  [id number]          PATIENT TYPE:  INP   LOCATION:  2115                         FACILITY:  [facility]   PHYSICIAN:  Salvatore Decent. Cornelius Moras, M.D. DATE OF BIRTH:  06/18/1932   DATE OF CONSULTATION:  01/12/2005  DATE OF DISCHARGE:                                   CONSULTATION   REFERRING PHYSICIAN:  Dr. Marcelyn Bruins.   REASON FOR CONSULTATION:  Hemothorax with acute blood loss anemia and  hypovolemic shock.   HISTORY OF PRESENT ILLNESS:  [name] is a 77 year old white female with  past medical history notable for history of severe chronic obstructive  pulmonary disease, tobacco abuse, Wernicke's encephalopathy, dementia,  depression, hyperlipidemia, and longstanding alcohol use. The patient was  admitted to [facility] on January1,2007 with left-sided  pleuritic chest pain and a prolonged febrile illness with radiographic  evidence for left lower lobe pneumonia with associated left empyema based on  chest x-ray and chest CT scan. She was evaluated in consultation by Dr.  Sung Amabile of the pulmonary critical care team on the early morning hours of  January3,2007. She underwent attempted needle thoracentesis on the left side  but only a very small amount of turbid, amber fluid was obtained.  Subsequently, a left chest tube was placed by Dr. Sung Amabile. By report, 500-  600 cc of cloudy amber fluid was drained immediately. Over the ensuing six  hours, the patient developed tachycardia and hypotension consistent with  shock. This was initially felt to be likely secondary to ongoing sepsis.  However, follow-up complete blood count demonstrated dramatic fall in the  patient's hemoglobin from a baseline level of 13.4 to less than 6, all  consistent with acute blood loss anemia. The patient was resuscitated with  transfusion of 4 units packed red blood cells as well as 2 units fresh  frozen plasma.   Follow-up chest CT scan demonstrates radiographic evidence  for massive left hemothorax. The patient was transferred to [facility] at my request for subsequent emergent thoracic surgical  consultation and possible surgical treatment.   The remainder of the patient's past medical history, past surgical history,  previous medications, drug allergies, family history, social history are all  documented in the patient's chart.   Review of systems is not obtained as the patient is on arrival to the  intensive care unit intubated and lightly sedated on ventilatory support.  The patient has on arrival systolic blood pressure greater than 150 mmHg  with continuous infusions of Levophed, vasopressin and dopamine running. The  patient opens eyes to name but does not follow any commands. The extremities  are warm and well-perfused. Breath sounds are coarse and somewhat diminished  on the left side. There is a chest tube in place on left side with thin,  sanguineous fluid. A total of 900 cc in the Pleur-Evac. The abdomen is soft,  nondistended. Extremities are warm and adequately perfused. There is a  radial arterial line placed as well as a central venous catheter. A Foley  catheter is  in place with clear yellow urine present.   DIAGNOSTIC TESTS:  Chest CT scan performed this afternoon it is evaluated  and compared with previous CT scan from January 10, 2005. This demonstrates  initially upon presentation findings consistent with a loculated  parapneumonic effusion on the left side consistent with probable empyema  with ongoing pneumonia and severe chronic obstructive pulmonary disease.  There is some nonspecific mild mediastinal lymphadenopathy. On the follow-up  CT scan performed this afternoon, there is now a large amount of material in  the left pleural space consistent with blood and clotted blood that  essentially fills the majority of left pleural space and compressive the   majority of the left lung and also appears to compress the diaphragm  inferiorly displacing the spleen anteriorly. There is a chest tube in place  posteriorly in the left pleural space. There is no sign of hemoperitoneum  and the spleen itself appears intact, although the follow-up scan was  performed without IV contrast.   IMPRESSION:  Massive left hemothorax complicating previous chest tube  placement in the setting of left-sided empyema in this elderly patient with  multiple medical problems. The patient first developed signs of shock and  approximately 8 o'clock this morning and has been unstable ever since,  although she has responded well to rapid volume resuscitation with four  units packed red blood cells as previously noted. The patient's prognosis is  quite poor. However, under the circumstances, I suspect her only chance for  survival is to proceed directly to the operating room for emergent surgical  exploration for left thoracotomy for evacuation of the hemothorax, control  of ongoing residual bleeding, and more definitive drainage of her empyema.   PLAN:  I have discussed issues at length with [name]'s family including  her daughter and medical power of attorney, Ms. [name]. They  understand the very serious nature of this set of circumstances. They  understand that there is high likelihood that the patient will not survive.  They also understand that if she does survive it will likely require a  prolonged hospitalization and convalescence. They specifically accept all  associated risks associated with surgery including but not limited to risk  of death, stroke, myocardial infarction, respiratory failure, pneumonia,  acute respiratory distress syndrome, recurrent or prolonged blood loss  requiring massive transfusion of blood products, prolonged air leak,  prolonged chest wall discomfort, recurrent or persistent infection. All their questions have been addressed.  We plan to proceed to the OR directly  for surgical exploration.      Salvatore Decent. Cornelius Moras, M.D.  Electronically Signed     CHO/MEDQ  D:  01/12/2005  T:  01/12/2005  Job:  [id number]

## 2010-05-28 NOTE — Consult Note (Signed)
NAME:  [name], [name]                          ACCOUNT NO.:  [id number]   MEDICAL RECORD NO.:  [id number]                   PATIENT TYPE:  INP   LOCATION:  3040                                 FACILITY:  [facility]   PHYSICIAN:  Pramod P. Pearlean Brownie, MD                 DATE OF BIRTH:  1932-11-17   DATE OF CONSULTATION:  04/01/2003  DATE OF DISCHARGE:                                   CONSULTATION   REASON FOR CONSULTATION:  Fever and mental status changes.   HISTORY OF PRESENT ILLNESS:  [name] is a 77 year old pleasant lady who  was known to be from recent hospital admission.  The patient has been  admitted this time for complaints of headache, altered mental status,  worsening gait difficulties, and fevers for the last three days.  She was  admitted at [facility] and had a CT scan of the head which showed  increased ventricular size and the patient is presenting for evaluation and  treatment for shunt malfunction.  She __________ for mental status changes  along with some facial drooping and slurred speech.  She was initially  thought to have a TIA although the MRI scan did not reveal any ischemia.  There were changes of abnormal signal intensity in the mamillary bodies  __________ encephalopathy.  She also had a component of benzodiazepine  withdrawal during that admission.  She made a slow recovery and eventually  discharged home.  She was seen by Dr. __________ in followup on March 06, 2003 and had showed significant improvement and her mental status was  significantly better.  Neurologic status had not improved.  The patient has  had ventricular peritoneal shunt placed in October of the past year for  normal pressure hydrocephalus.  The patient and her husband states she had  noted some initial improvement in the gait and balance but this short-  lasting.  Over the last six months or so, her gait and balance have  significantly regressed to what they were prior to the  shunt.   PAST MEDICAL HISTORY:  1. COPD.  2. Back pain.  3. Depression.  4. Hyperlipidemia.  5. Hypertension.  6. Chronic benzodiazepines use.   HOME MEDICATIONS:  Phenobarbital, Valium, Advair, aspirin, Paxil, BuSpar,  vitamin B, Os-Cal, Premarin, Nexium, thiamine, atenolol.   PAST SURGICAL HISTORY:  VP shunt surgery.   ALLERGIES:  1. PENICILLIN.  2. SULFA.  3. CODEINE.   SOCIAL HISTORY:  The patient is married and lives with her husband.  Says  she is a nonsmoker.  She used to drink alcohol but has quit since September  of last year.   PHYSICAL EXAMINATION:  GENERAL:  Reveals a frail, elderly lady in moderate  distress.  VITAL SIGNS:  She is febrile with pulse rate of 78 per minute and regular.  Temperature 102.2, blood pressure 150/80, distal pulses are heard.  HEENT:  Head is nontraumatic.  I did not get significant ___________.  NECK:  No neck stiffness.  Kernig's and Brudzinski's signs are also  negative.  CARDIAC:  No murmur or gallop.  LUNGS:  Clear to auscultation.  NEUROLOGIC:  The patient is awake, alert. She is oriented only to person,  but not to time or place.  She follows two-step commands.  She has decreased  attention, recall and short-term memory.  There is psychomotor retardation  with delayed answering of questions with decreased reaction time.  There is  no __________.  Pupils are regular and reactive.  Eye movements are full  range.  There is no subjective diplopia or objective ophthalmoplegia on exam  today.  Face is symmetric, but left nasolabial fold asymmetry.  Palate moves  ___________, tongue midline.  Motor exam reveals __________ strength, tone,  reflexes, coordination, sensation.  There is significant motor perseveration  with positive grasp __________ and glabellar.  Plantar in this patient leads  withdrawal response bilaterally.   LABORATORY DATA:  A CT scan of the head today shows extensive amount of  ___________, anterior third  ventricle and subarachnoid spaces.  _________.  There is also increased air noted over the area of the VP shunt with the  wire from the skull in and along its tract.  The WBC today shows elevated  white cells of 27 with red cells of 19 with normal protein and glucose.  Peripheral WBC count is elevated at 22.4 with 74% neutrophils and 12 bands.  Sodium is low at 129, potassium 2.  As compared to the present CSF, CSF  dated February 04, 2003 showed __________ wbc's.   IMPRESSION:  A 77 year old lady with acute onset of fever, headache, mental  status changes, gait and balance difficulties, abnormal spinal fluid, and CT  scan showing enlarged ventricles and _________ compatible with  ventriculoperitoneal shunt.  __________.  tomorrow.  __________ the patient and daughter ___________.                                               Pramod P. Pearlean Brownie, MD    PPS/MEDQ  D:  04/01/2003  T:  04/02/2003  Job:  [id number]

## 2010-09-16 ENCOUNTER — Other Ambulatory Visit: Payer: Self-pay | Admitting: Surgery

## 2011-01-21 ENCOUNTER — Other Ambulatory Visit: Payer: Self-pay | Admitting: Family Medicine

## 2011-01-21 DIAGNOSIS — Z1231 Encounter for screening mammogram for malignant neoplasm of breast: Secondary | ICD-10-CM

## 2011-02-17 ENCOUNTER — Ambulatory Visit (HOSPITAL_COMMUNITY)
Admission: RE | Admit: 2011-02-17 | Discharge: 2011-02-17 | Disposition: A | Discharge status: Home / Self Care | Payer: Medicare Other | Source: Ambulatory Visit | Attending: Family Medicine | Admitting: Family Medicine

## 2011-02-17 DIAGNOSIS — Z1231 Encounter for screening mammogram for malignant neoplasm of breast: Secondary | ICD-10-CM | POA: Diagnosis not present

## 2011-03-02 DIAGNOSIS — E782 Mixed hyperlipidemia: Secondary | ICD-10-CM | POA: Diagnosis not present

## 2011-03-02 DIAGNOSIS — R5383 Other fatigue: Secondary | ICD-10-CM | POA: Diagnosis not present

## 2011-03-02 DIAGNOSIS — I10 Essential (primary) hypertension: Secondary | ICD-10-CM | POA: Diagnosis not present

## 2011-03-02 DIAGNOSIS — E785 Hyperlipidemia, unspecified: Secondary | ICD-10-CM | POA: Diagnosis not present

## 2011-03-03 DIAGNOSIS — L851 Acquired keratosis [keratoderma] palmaris et plantaris: Secondary | ICD-10-CM | POA: Diagnosis not present

## 2011-03-03 DIAGNOSIS — L609 Nail disorder, unspecified: Secondary | ICD-10-CM | POA: Diagnosis not present

## 2011-03-03 DIAGNOSIS — I70209 Unspecified atherosclerosis of native arteries of extremities, unspecified extremity: Secondary | ICD-10-CM | POA: Diagnosis not present

## 2011-05-12 DIAGNOSIS — L609 Nail disorder, unspecified: Secondary | ICD-10-CM | POA: Diagnosis not present

## 2011-05-12 DIAGNOSIS — I70209 Unspecified atherosclerosis of native arteries of extremities, unspecified extremity: Secondary | ICD-10-CM | POA: Diagnosis not present

## 2011-05-12 DIAGNOSIS — L851 Acquired keratosis [keratoderma] palmaris et plantaris: Secondary | ICD-10-CM | POA: Diagnosis not present

## 2011-07-28 DIAGNOSIS — I739 Peripheral vascular disease, unspecified: Secondary | ICD-10-CM | POA: Diagnosis not present

## 2011-09-27 ENCOUNTER — Encounter: Payer: Self-pay | Admitting: Internal Medicine

## 2011-10-06 DIAGNOSIS — I739 Peripheral vascular disease, unspecified: Secondary | ICD-10-CM | POA: Diagnosis not present

## 2011-10-13 DIAGNOSIS — H2510 Age-related nuclear cataract, unspecified eye: Secondary | ICD-10-CM | POA: Diagnosis not present

## 2011-10-18 DIAGNOSIS — H2510 Age-related nuclear cataract, unspecified eye: Secondary | ICD-10-CM | POA: Diagnosis not present

## 2011-10-19 DIAGNOSIS — Z23 Encounter for immunization: Secondary | ICD-10-CM | POA: Diagnosis not present

## 2011-10-24 DIAGNOSIS — H2510 Age-related nuclear cataract, unspecified eye: Secondary | ICD-10-CM | POA: Diagnosis not present

## 2011-10-24 DIAGNOSIS — H2589 Other age-related cataract: Secondary | ICD-10-CM | POA: Diagnosis not present

## 2011-11-02 DIAGNOSIS — H2510 Age-related nuclear cataract, unspecified eye: Secondary | ICD-10-CM | POA: Diagnosis not present

## 2011-11-07 DIAGNOSIS — H2589 Other age-related cataract: Secondary | ICD-10-CM | POA: Diagnosis not present

## 2011-11-07 DIAGNOSIS — H2510 Age-related nuclear cataract, unspecified eye: Secondary | ICD-10-CM | POA: Diagnosis not present

## 2011-12-02 ENCOUNTER — Encounter: Payer: Self-pay | Admitting: Internal Medicine

## 2011-12-02 ENCOUNTER — Ambulatory Visit (INDEPENDENT_AMBULATORY_CARE_PROVIDER_SITE_OTHER): Payer: Medicare Other | Admitting: Internal Medicine

## 2011-12-02 VITALS — BP 102/60 | HR 68 | Ht 64.0 in | Wt 133.0 lb

## 2011-12-02 DIAGNOSIS — R197 Diarrhea, unspecified: Secondary | ICD-10-CM | POA: Diagnosis not present

## 2011-12-02 DIAGNOSIS — Z860100 Personal history of colon polyps, unspecified: Secondary | ICD-10-CM

## 2011-12-02 MED ORDER — SOD PICOSULFATE-MAG OX-CIT ACD 10-3.5-12 MG-GM-GM PO PACK: 1.0000 | PACK | Freq: Once | ORAL | Status: DC

## 2011-12-02 MED ORDER — LOPERAMIDE HCL 2 MG PO TABS: 2.0000 mg | ORAL_TABLET | Freq: Four times a day (QID) | ORAL | Status: DC | PRN

## 2011-12-02 NOTE — Patient Instructions (Addendum)
You have been given a separate informational sheet regarding your tobacco use, the importance of quitting and local resources to help you quit.  You have been scheduled for a colonoscopy with propofol. Please follow written instructions given to you at your visit today.  Please use the prepopik kit you have been given today. If you use inhalers (even only as needed) or a CPAP machine, please bring them with you on the day of your procedure.  Thank you for choosing me and [facility].  Iva Boop, M.D., [facility]

## 2011-12-02 NOTE — Progress Notes (Signed)
  Subjective:    Patient ID: [name], female    DOB: Jan 31, 1932, 78 y.o.   MRN: [id number]  HPI This elderly lady returns, last seen 10/2008 for EGD and dilation of esophagus. She is due for a screening and surveillance colonoscopy as she has had adenomatous colon/rectal polyps, last in 2008. For a few weeks she has had urgent loose defecation and diarrhea after eating. No recent oral antibiotics. Is on eye drops as she recovers from cataract surgery. No bleeding. Loperamide helps but when she took 4 mg did not defecate for 3 days. Has had 1 episode of incontinence and is wearing pads.  Medications, allergies, past medical history, past surgical history, family history and social history are reviewed and updated in the EMR.  Review of Systems Stable dyspnea, is reducing cigarette smoking. Recent cataract surgery and improved vision.    Objective:   Physical Exam General:  NAD Eyes:   anicteric Lungs:  clear Heart:  S1S2 no rubs, murmurs or gallops Abdomen:  soft and nontender, BS+, no HSM/mass - rectal deferred Ext:   no edema Neuro:  A and O x 3   Data Reviewed:  2010 EGD 2008 colonoscopy/pathology     Assessment & Plan:   1. Diarrhea - most likely IBS  2. Personal history of adenomatous colonic polyps      1. Colonoscopy for #1 and #2 The risks and benefits as well as alternatives of endoscopic procedure(s) have been discussed and reviewed. All questions answered. The patient agrees to proceed. Consider random bxs but she has had hx switching from constipated to diarrhea-predominant IBS

## 2011-12-07 ENCOUNTER — Encounter: Payer: Self-pay | Admitting: Internal Medicine

## 2011-12-07 ENCOUNTER — Ambulatory Visit (AMBULATORY_SURGERY_CENTER): Payer: Medicare Other | Admitting: Internal Medicine

## 2011-12-07 VITALS — BP 102/69 | HR 67 | Temp 97.5°F | Resp 12 | Ht 64.0 in | Wt 133.0 lb

## 2011-12-07 DIAGNOSIS — R933 Abnormal findings on diagnostic imaging of other parts of digestive tract: Secondary | ICD-10-CM

## 2011-12-07 DIAGNOSIS — R197 Diarrhea, unspecified: Secondary | ICD-10-CM

## 2011-12-07 DIAGNOSIS — K5289 Other specified noninfective gastroenteritis and colitis: Secondary | ICD-10-CM | POA: Diagnosis not present

## 2011-12-07 DIAGNOSIS — E785 Hyperlipidemia, unspecified: Secondary | ICD-10-CM | POA: Diagnosis not present

## 2011-12-07 DIAGNOSIS — J189 Pneumonia, unspecified organism: Secondary | ICD-10-CM | POA: Diagnosis not present

## 2011-12-07 DIAGNOSIS — Z86010 Personal history of colonic polyps: Secondary | ICD-10-CM | POA: Diagnosis not present

## 2011-12-07 DIAGNOSIS — J449 Chronic obstructive pulmonary disease, unspecified: Secondary | ICD-10-CM | POA: Diagnosis not present

## 2011-12-07 DIAGNOSIS — K589 Irritable bowel syndrome without diarrhea: Secondary | ICD-10-CM | POA: Diagnosis not present

## 2011-12-07 DIAGNOSIS — F329 Major depressive disorder, single episode, unspecified: Secondary | ICD-10-CM | POA: Diagnosis not present

## 2011-12-07 MED ORDER — SODIUM CHLORIDE 0.9 % IV SOLN: 500.0000 mL | INTRAVENOUS | Status: DC

## 2011-12-07 NOTE — Progress Notes (Signed)
Patient did not experience any of the following events: a burn prior to discharge; a fall within the facility; wrong site/side/patient/procedure/implant event; or a hospital transfer or hospital admission upon discharge from the facility. (G8907) Patient did not have preoperative order for IV antibiotic SSI prophylaxis. (G8918)  

## 2011-12-07 NOTE — Op Note (Signed)
[facility] [address] [place], [address]   COLONOSCOPY PROCEDURE REPORT  PATIENT: Sara, Long  MR#: [id number] BIRTHDATE: 18-Apr-1932 , 79  yrs. old GENDER: Female ENDOSCOPIST: Iva Boop, MD, [facility] PROCEDURE DATE:  12/07/2011 PROCEDURE:   Colonoscopy with biopsy ASA CLASS:   Class III INDICATIONS:unexplained diarrhea and Patient's personal history of adenomatous colon polyps. MEDICATIONS: propofol (Diprivan) 200mg  IV, MAC sedation, administered by CRNA, and These medications were titrated to patient response per physician's verbal order  DESCRIPTION OF PROCEDURE:   After the risks benefits and alternatives of the procedure were thoroughly explained, informed consent was obtained.  A digital rectal exam revealed no abnormalities of the rectum.   The LB [id number]-[id number]  endoscope was introduced through the anus and advanced to the terminal ileum which was intubated for a short distance. No adverse events experienced.   The quality of the prep was Prepopik excellent  The instrument was then slowly withdrawn as the colon was fully examined.      COLON FINDINGS: Moderate erythematous mucosa, patchy and sowhat granular, was found in the sigmoid colon.  Multiple biopsies were performed using cold forceps.   The colon mucosa was otherwise normal.   The mucosa appeared normal in the terminal ileum. Retroflexed views in rectum and right colon revealed no abnormalities. The time to cecum=2 minutes 15 seconds.  Withdrawal time=11 minutes 57 seconds.  The scope was withdrawn and the procedure completed. COMPLICATIONS: There were no complications.  ENDOSCOPIC IMPRESSION: 1.   Moderate erythematous was found in the sigmoid colon; multiple biopsies were performed using cold forceps 2.   The colon mucosa was otherwise normal - random biopsies obtained 3.   Normal mucosa in the terminal ileum 4.   Personal hx adenomas in past, at least in 2002 and  2008  RECOMMENDATIONS: 1.  Office will call with the results. 2.   Continue Imodium (loperamide) 3.   ? needs routine repeat colonoscopy in future (age)  eSigned:  Iva Boop, MD, [facility] 12/07/2011 10:12 AM  cc: Rudi Heap, MD and The Patient

## 2011-12-07 NOTE — Patient Instructions (Addendum)
There was some red mucosa (colon lining) seen and biopsied. Normal colon biopsied also. Hopefully the biopsies will tell me why you have diarrhea. Keep using the Imodium (loperamide) to help the diarrhea. I should have some answers for you and call next week.  Not sure you will need another routine colonoscopy, if so earliest would be 5 years (age 78?).  Thank you for choosing me and [facility].  Iva Boop, MD, FACG  YOU HAD AN ENDOSCOPIC PROCEDURE TODAY AT THE [facility]: Refer to the procedure report that was given to you for any specific questions about what was found during the examination.  If the procedure report does not answer your questions, please call your gastroenterologist to clarify.  If you requested that your care partner not be given the details of your procedure findings, then the procedure report has been included in a sealed envelope for you to review at your convenience later.  YOU SHOULD EXPECT: Some feelings of bloating in the abdomen. Passage of more gas than usual.  Walking can help get rid of the air that was put into your GI tract during the procedure and reduce the bloating. If you had a lower endoscopy (such as a colonoscopy or flexible sigmoidoscopy) you may notice spotting of blood in your stool or on the toilet paper. If you underwent a bowel prep for your procedure, then you may not have a normal bowel movement for a few days.  DIET: Your first meal following the procedure should be a light meal and then it is ok to progress to your normal diet.  A half-sandwich or bowl of soup is an example of a good first meal.  Heavy or fried foods are harder to digest and may make you feel nauseous or bloated.  Likewise meals heavy in dairy and vegetables can cause extra gas to form and this can also increase the bloating.  Drink plenty of fluids but you should avoid alcoholic beverages for 24 hours.  ACTIVITY: Your care partner should take you  home directly after the procedure.  You should plan to take it easy, moving slowly for the rest of the day.  You can resume normal activity the day after the procedure however you should NOT DRIVE or use heavy machinery for 24 hours (because of the sedation medicines used during the test).    SYMPTOMS TO REPORT IMMEDIATELY: A gastroenterologist can be reached at any hour.  During normal business hours, 8:30 AM to 5:00 PM Monday through Friday, call [phone].  After hours and on weekends, please call the GI answering service at [phone] who will take a message and have the physician on call contact you.   Following lower endoscopy (colonoscopy or flexible sigmoidoscopy):  Excessive amounts of blood in the stool  Significant tenderness or worsening of abdominal pains  Swelling of the abdomen that is new, acute  Fever of 100F or higher  Following upper endoscopy (EGD)  Vomiting of blood or coffee ground material  New chest pain or pain under the shoulder blades  Painful or persistently difficult swallowing  New shortness of breath  Fever of 100F or higher  Black, tarry-looking stools  FOLLOW UP: If any biopsies were taken you will be contacted by phone or by letter within the next 1-3 weeks.  Call your gastroenterologist if you have not heard about the biopsies in 3 weeks.  Our staff will call the home number listed on your records the next business day  following your procedure to check on you and address any questions or concerns that you may have at that time regarding the information given to you following your procedure. This is a courtesy call and so if there is no answer at the home number and we have not heard from you through the emergency physician on call, we will assume that you have returned to your regular daily activities without incident.  SIGNATURES/CONFIDENTIALITY: You and/or your care partner have signed paperwork which will be entered into your electronic  medical record.  These signatures attest to the fact that that the information above on your After Visit Summary has been reviewed and is understood.  Full responsibility of the confidentiality of this discharge information lies with you and/or your care-partner.   Please follow all discharge instructions given to you by the recovery room nurse. If you have any questions or problems after discharge please call one of the numbers listed above. You will receive a phone call in the am to see how you are doing and answer any questions you may have. Thank you for choosing [facility] for your health care needs.

## 2011-12-07 NOTE — Progress Notes (Signed)
0932 SYMBICORT INHALER 2 PUFFS.

## 2011-12-12 ENCOUNTER — Telehealth: Payer: Self-pay | Admitting: *Deleted

## 2011-12-12 NOTE — Telephone Encounter (Signed)
  Follow up Call-  Call back number 12/07/2011  Post procedure Call Back phone  # [phone]  Permission to leave phone message Yes     [facility]

## 2011-12-15 ENCOUNTER — Encounter: Payer: Self-pay | Admitting: Internal Medicine

## 2011-12-15 DIAGNOSIS — K52832 Lymphocytic colitis: Secondary | ICD-10-CM | POA: Insufficient documentation

## 2011-12-15 HISTORY — DX: Lymphocytic colitis: K52.832

## 2011-12-15 NOTE — Progress Notes (Signed)
Quick Note:  Office  Let her know she has lymphocytic colitis - it is a microscopic colitis and prednisone will treat it. Most of time limited and not chronic.  Start prednisone 10 mg tabs, 4/day x 2 days, 3/day x 5 days, 2/day x 5 days, 1/ day x 5 days and 1/2/ day x 5 days then stop.  If diarrhea recurs or fails to respond she needs to call back and let us know Would expect improvement within 1 week though not resolution Loperamide prn is ok If she desires a follow-up visit can schedule for 4-6 weeks [facility]  No letter and no recall ______

## 2011-12-16 ENCOUNTER — Other Ambulatory Visit: Payer: Self-pay

## 2011-12-16 MED ORDER — PREDNISONE 10 MG PO TABS: ORAL_TABLET | ORAL | Status: DC

## 2011-12-29 DIAGNOSIS — L609 Nail disorder, unspecified: Secondary | ICD-10-CM | POA: Diagnosis not present

## 2011-12-29 DIAGNOSIS — L851 Acquired keratosis [keratoderma] palmaris et plantaris: Secondary | ICD-10-CM | POA: Diagnosis not present

## 2011-12-29 DIAGNOSIS — I70209 Unspecified atherosclerosis of native arteries of extremities, unspecified extremity: Secondary | ICD-10-CM | POA: Diagnosis not present

## 2012-01-02 DIAGNOSIS — I10 Essential (primary) hypertension: Secondary | ICD-10-CM | POA: Diagnosis not present

## 2012-01-02 DIAGNOSIS — E785 Hyperlipidemia, unspecified: Secondary | ICD-10-CM | POA: Diagnosis not present

## 2012-01-09 DIAGNOSIS — R3919 Other difficulties with micturition: Secondary | ICD-10-CM | POA: Diagnosis not present

## 2012-01-09 DIAGNOSIS — K219 Gastro-esophageal reflux disease without esophagitis: Secondary | ICD-10-CM | POA: Diagnosis not present

## 2012-01-09 DIAGNOSIS — J441 Chronic obstructive pulmonary disease with (acute) exacerbation: Secondary | ICD-10-CM | POA: Diagnosis not present

## 2012-01-12 ENCOUNTER — Other Ambulatory Visit: Payer: Self-pay | Admitting: *Deleted

## 2012-01-12 DIAGNOSIS — R609 Edema, unspecified: Secondary | ICD-10-CM

## 2012-02-02 ENCOUNTER — Other Ambulatory Visit: Payer: Self-pay | Admitting: Family Medicine

## 2012-02-02 DIAGNOSIS — Z1231 Encounter for screening mammogram for malignant neoplasm of breast: Secondary | ICD-10-CM

## 2012-02-07 ENCOUNTER — Telehealth: Payer: Self-pay | Admitting: Internal Medicine

## 2012-02-07 NOTE — Telephone Encounter (Signed)
Agree with 1/2 dose loperamide Needs REV to discuss further May be IBS-D

## 2012-02-07 NOTE — Telephone Encounter (Signed)
Patient completed her course of prednisone for lymphocytic colitis.  She really had no improvement while on prednisone.  The was prescribed cipro metronidazole at the endo of December by her primary care for elevated white count.  She has loose watery stools after each meal usually 1-3 stools after the meals.  Please advise.  She took loperamide one day and reports it was 4 days before she had a BM.  She is asked to try 1/2 in the am when she gets up

## 2012-02-07 NOTE — Telephone Encounter (Signed)
Patient offered an appt for 02/09/12, but she declined due to the possibility of weather.  She wants to wait until Dr. Marvell Fuller next opening in Feb.  She is scheduled for 02/24/12 11:00

## 2012-02-09 ENCOUNTER — Ambulatory Visit (INDEPENDENT_AMBULATORY_CARE_PROVIDER_SITE_OTHER): Payer: Medicare Other | Admitting: Internal Medicine

## 2012-02-09 ENCOUNTER — Encounter: Payer: Self-pay | Admitting: Internal Medicine

## 2012-02-09 ENCOUNTER — Ambulatory Visit: Payer: Medicare Other | Admitting: Internal Medicine

## 2012-02-09 VITALS — BP 110/60 | HR 100 | Ht 64.0 in | Wt 129.2 lb

## 2012-02-09 DIAGNOSIS — K52832 Lymphocytic colitis: Secondary | ICD-10-CM

## 2012-02-09 DIAGNOSIS — R197 Diarrhea, unspecified: Secondary | ICD-10-CM | POA: Diagnosis not present

## 2012-02-09 DIAGNOSIS — K589 Irritable bowel syndrome without diarrhea: Secondary | ICD-10-CM | POA: Diagnosis not present

## 2012-02-09 DIAGNOSIS — K5289 Other specified noninfective gastroenteritis and colitis: Secondary | ICD-10-CM | POA: Diagnosis not present

## 2012-02-09 MED ORDER — COLESTIPOL HCL 5 G PO GRAN: 5.0000 g | GRANULES | Freq: Two times a day (BID) | ORAL | Status: DC

## 2012-02-09 NOTE — Progress Notes (Signed)
  Subjective:    Patient ID: [name], female    DOB: 18-Jan-1932, 79 y.o.   MRN: [id number]  HPI The patient returns in follow-up - she had lymphocytic colitis diagnosed at colonoscopy in the past 2 months. A course of prednisone was prescribed - it had no effect. The patient has tried loperamide but one or two tablets caused no defecation for 4 days. A half tablet was not very effective. She continues with loose, watery stools several times a day, usually in the AM with urgency. Her anal area is raw and sore from the wiping. She is using baby wipes and vaseline to help. She has a hx of IBS but says current sxs far worse than ever before.  Medications, allergies, past medical history, past surgical history, family history and social history are reviewed and updated in the EMR.  Review of Systems No fever. Did have a leukocytosis in December - likely from the prednisone, was treated by PCP office with cipro and metronidazole    Objective:   Physical Exam General:  NAD Eyes:   anicteric Abdomen:  soft and nontender, BS+ Ext:   no edema    Data Reviewed:  Colonoscopy and pathology reports     Assessment & Plan:   1. Diarrhea   2. IBS (irritable bowel syndrome)   3. Lymphocytic colitis    1. I suspect most of this is IBS (perhaps inflammatory IBS) 2. Will try colestipol 5 g bid to see if that controls diarrhea. She is also s/p cholecystectomy and segmental colon resection in past. 3. F/U by phone call to [place] in 1 week and in office 1 month - which can be cancelled if excellent response to therapy  Cc: Bennie Pierini, NP

## 2012-02-09 NOTE — Patient Instructions (Addendum)
We have sent  medications to your pharmacy for you to pick up at your convenience.   You were given samples of Calmoseptine, please apply several times a day to your buttock rash.  Call us back with the name of your acid reducer medicine please.  Call us back in a week with an update.   Follow up with [place] in a month.  Thank you for choosing me and [facility].  Iva Boop, M.D., [facility]

## 2012-02-15 ENCOUNTER — Encounter: Payer: Self-pay | Admitting: Vascular Surgery

## 2012-02-16 ENCOUNTER — Encounter: Payer: Self-pay | Admitting: Vascular Surgery

## 2012-02-16 ENCOUNTER — Encounter (INDEPENDENT_AMBULATORY_CARE_PROVIDER_SITE_OTHER): Payer: Medicare Other | Admitting: *Deleted

## 2012-02-16 ENCOUNTER — Ambulatory Visit (INDEPENDENT_AMBULATORY_CARE_PROVIDER_SITE_OTHER): Payer: Medicare Other | Admitting: Vascular Surgery

## 2012-02-16 VITALS — BP 129/65 | HR 79 | Ht 64.0 in | Wt 131.8 lb

## 2012-02-16 DIAGNOSIS — I739 Peripheral vascular disease, unspecified: Secondary | ICD-10-CM

## 2012-02-16 DIAGNOSIS — R609 Edema, unspecified: Secondary | ICD-10-CM

## 2012-02-16 NOTE — Progress Notes (Signed)
[facility] [place] HISTORY AND PHYSICAL   History of Present Illness:  Patient is a 79 y.o. year old female who presents for evaluation of lower extremity swelling and pain.  The patient has had intermittent swelling of her lower extremities for one year. She has also developed weakness in her lower extremities with ambulation. This occurs approximately one half block. This is also going on approximately one year. The patient has a heavy smoking history in the past. She currently smokes one half pack of cigarettes per day. She does not really seem interested in quitting. Greater than 3 minutes they were spent regarding smoking cessation counseling. She denies rest pain. However she does describe a sensation under her feet of walking on gravel which sounds like neuropathy. Other medical problems include recent refractory diarrhea and diverticulitis. She currently is still being followed by Dr. Leone Payor with GI for this. She also has a history of COPD, depression anxiety, hyperlipidemia, peptic ulcer disease, severe pneumonia requiring ventilator.  All these are currently stable. Her COPD I would classify as a moderate to severe. Her legs do give way prior to her becoming short of breath but not much before.  She develops dyspnea with minimal exertion. She denies prior history of myocardial infarction or stroke.  Past Medical History  Diagnosis Date  . Anxiety disorder   . GERD (gastroesophageal reflux disease)   . COPD (chronic obstructive pulmonary disease)   . Depression   . Hyperlipidemia   . Peptic ulcer disease   . Pneumonia   . Pulmonary embolism   . Colon adenoma   . Pseudotumor cerebri   . Irritable bowel syndrome   . Lymphocytic colitis 12/15/2011    Past Surgical History  Procedure Date  . Cholecystectomy   . Colon resection   . Abdominal hysterectomy   . Brain surgery     10 YEARS AGO   . Cataract extraction     BOTH EYES  . Upper gastrointestinal  endoscopy   . Colonoscopy      Social History History  Substance Use Topics  . Smoking status: Current Every Day Smoker -- 0.5 packs/day    Types: Cigarettes  . Smokeless tobacco: Never Used     Comment: pt states "I will wait until all of this is over"  . Alcohol Use: No    Family History Family History  Problem Relation Age of Onset  . Lung cancer Father   . Colon cancer Neg Hx   . Cancer Mother   . Hyperlipidemia Mother   . Hypertension Mother   . Heart attack Mother   . Hyperlipidemia Sister     Allergies  Allergies  Allergen Reactions  . Codeine   . Penicillins   . Sulfa Antibiotics      Current Outpatient Prescriptions  Medication Sig Dispense Refill  . albuterol (PROVENTIL HFA;VENTOLIN HFA) 108 (90 BASE) MCG/ACT inhaler Inhale 2 puffs into the lungs every 6 (six) hours as needed.      . ALPRAZolam (XANAX) 0.5 MG tablet Take 0.5 mg by mouth daily.      Marland Kitchen aspirin 81 MG tablet Take 81 mg by mouth daily.      . colestipol (COLESTID) 5 G granules Take 5 g by mouth 2 (two) times daily with a meal. Lunch and supper  500 g  12  . pantoprazole (PROTONIX) 40 MG tablet Take 40 mg by mouth daily.      . rosuvastatin (CRESTOR) 40 MG tablet Take 40  mg by mouth daily.      . solifenacin (VESICARE) 5 MG tablet Take 10 mg by mouth daily.      Marland Kitchen triamterene-hydrochlorothiazide (DYAZIDE) 37.5-25 MG per capsule Take 1 capsule by mouth every morning.      Marland Kitchen esomeprazole (NEXIUM) 40 MG capsule Take 40 mg by mouth daily before breakfast.      . loperamide (IMODIUM A-D) 2 MG tablet Take 1 tablet (2 mg total) by mouth 4 (four) times daily as needed for diarrhea or loose stools.  30 tablet  0    ROS:   General:  No weight loss, +Fever, +chills  HEENT: No recent headaches, no nasal bleeding, no visual changes, no sore throat  Neurologic: No dizziness, blackouts, seizures. No recent symptoms of stroke or mini- stroke. No recent episodes of slurred speech, or temporary  blindness.  Cardiac: No recent episodes of chest pain/pressure, no shortness of breath at rest.  + shortness of breath with exertion.  Denies history of atrial fibrillation or irregular heartbeat  Vascular: No history of rest pain in feet.  + history of claudication.  No history of non-healing ulcer, No history of DVT, does have history of pulmonary embolus   Pulmonary: No home oxygen, no productive cough, no hemoptysis,  + asthma or wheezing  Musculoskeletal:  [ ]  Arthritis, [ ]  Low back pain,  [ ]  Joint pain  Hematologic:No history of hypercoagulable state.  No history of easy bleeding.  No history of anemia  Gastrointestinal: No hematochezia or melena,  No gastroesophageal reflux, no trouble swallowing  Urinary: [ ]  chronic Kidney disease, [ ]  on HD - [ ]  MWF or [ ]  TTHS, [ ]  Burning with urination, [ ]  Frequent urination, [ ]  Difficulty urinating;   Skin: No rashes  Psychological: + history of anxiety,  + history of depression   Physical Examination  Filed Vitals:   02/16/12 1430  BP: 129/65  Pulse: 79  Height: 5\' 4"  (1.626 m)  Weight: 131 lb 12.8 oz (59.784 kg)  SpO2: 99%    Body mass index is 22.62 kg/(m^2).  General:  Alert and oriented, no acute distress HEENT: Normal Neck: No bruit or JVD Pulmonary: Clear to auscultation bilaterally Cardiac: Regular Rate and Rhythm without murmur Abdomen: Soft, non-tender, non-distended, no mass Skin: No rash Extremity Pulses:  2+ radial, brachial, 1+ femoral, absent popliteal dorsalis pedis, posterior tibial pulses bilaterally Musculoskeletal: No deformity or edema  Neurologic: Upper and lower extremity motor 5/5 and symmetric  DATA: The patient had a venous duplex exam today. Showed no evidence of DVT. She did have evidence of mild common femoral reflux disease bilaterally. She had no superficial venous reflux. I reviewed and interpreted this study.   ASSESSMENT: Bilateral lower extremity pain. This seems to be a larger  component and the swelling is for her. Most likely this represents arterial occlusive disease in both lower extremities. She probably has primarily superficial femoral occlusive disease.   PLAN:  I discussed several options with the patient and her family today. I emphasized to her the importance of smoking cessation. I also discussed with her starting a walking program of 30 minutes daily. We also did discuss the possibility of an arteriogram to further evaluate her arterial tree. However, I would defer this for now until she fails conservative management. I do not believe she is currently at risk of limb loss and she does not have rest pain or ulcerations. She also currently is still dealing with her GI problems  and would like to have this addressed first. She has followup with Dr. Leone Payor in the near future. She will followup with me in 3 months with an arterial duplex and ABIs at that time.  Fabienne Bruns, MD [facility] [place] Office: [phone] Pager: [phone]

## 2012-02-17 NOTE — Addendum Note (Signed)
Addended by: Sharee Pimple on: 02/17/2012 01:20 PM   Modules accepted: Orders

## 2012-02-22 ENCOUNTER — Ambulatory Visit (HOSPITAL_COMMUNITY): Payer: Medicare Other

## 2012-02-24 ENCOUNTER — Ambulatory Visit: Payer: Medicare Other | Admitting: Internal Medicine

## 2012-03-07 ENCOUNTER — Ambulatory Visit (HOSPITAL_COMMUNITY)
Admission: RE | Admit: 2012-03-07 | Discharge: 2012-03-07 | Disposition: A | Discharge status: Home / Self Care | Payer: Medicare Other | Source: Ambulatory Visit | Attending: Family Medicine | Admitting: Family Medicine

## 2012-03-07 DIAGNOSIS — Z1231 Encounter for screening mammogram for malignant neoplasm of breast: Secondary | ICD-10-CM | POA: Diagnosis not present

## 2012-03-08 DIAGNOSIS — B351 Tinea unguium: Secondary | ICD-10-CM | POA: Diagnosis not present

## 2012-03-08 DIAGNOSIS — I70209 Unspecified atherosclerosis of native arteries of extremities, unspecified extremity: Secondary | ICD-10-CM | POA: Diagnosis not present

## 2012-03-09 ENCOUNTER — Encounter: Payer: Self-pay | Admitting: Internal Medicine

## 2012-03-09 ENCOUNTER — Ambulatory Visit (INDEPENDENT_AMBULATORY_CARE_PROVIDER_SITE_OTHER): Payer: Medicare Other | Admitting: Internal Medicine

## 2012-03-09 VITALS — BP 120/62 | HR 74 | Ht 64.0 in | Wt 130.0 lb

## 2012-03-09 DIAGNOSIS — K589 Irritable bowel syndrome without diarrhea: Secondary | ICD-10-CM

## 2012-03-09 MED ORDER — COLESTIPOL HCL 1 G PO TABS: 2.0000 g | ORAL_TABLET | Freq: Two times a day (BID) | ORAL | Status: DC

## 2012-03-09 NOTE — Progress Notes (Signed)
  Subjective:    Patient ID: [name], female    DOB: [birth year], 79 y.o.   MRN: [id number]  HPI She is here for f/u IBS. On 5 g bid colestipol she was constipated. Reduced to 5 g daily and life is good.No diarrhea or constipation She stopped Crestor since colestipol is a lipid agent  Medications, allergies, past medical history, past surgical history, family history and social history are reviewed and updated in the EMR.   Review of Systems As above    Objective:   Physical Exam Elderly NAD     Assessment & Plan:  IBS (irritable bowel syndrome) - diarrhea predominant - improved  1. Continue colestipol but change to tablets - her preference - 1 g tabs 2 bid 2. See me as needed 3. Low reside diet fprovided 4. Resume Crestor - discuss with PCP   15 mins spent with patient over half counselling/care coordination   JY:NWGNF, Dorinda Hill, MD

## 2012-03-09 NOTE — Patient Instructions (Addendum)
Today we are giving you a written rx to take to your pharmacy for Colestid 1gram tablets.  Take as directed.    Today we have given you a low fiber diet to read and follow.  Thank you for choosing me and [facility].  Iva Boop, M.D., [facility]

## 2012-03-12 ENCOUNTER — Encounter: Payer: Self-pay | Admitting: Internal Medicine

## 2012-03-12 NOTE — Progress Notes (Signed)
Patient ID: [name], female   DOB: 1932/08/27, 79 y.o.   MRN: [id number] Faxed the [facility] at fax # [phone] that the correct # of Colestid to be disp. Is #120 per Dr. Leone Payor., original rx said #60 and they questioned it since it was 2 tablets twice a day.

## 2012-05-01 ENCOUNTER — Other Ambulatory Visit: Payer: Self-pay

## 2012-05-01 MED ORDER — ALPRAZOLAM 0.5 MG PO TABS: 0.5000 mg | ORAL_TABLET | Freq: Two times a day (BID) | ORAL | Status: DC

## 2012-05-01 NOTE — Telephone Encounter (Signed)
Last filled 03/26/12   Last seen 12/30/11

## 2012-05-01 NOTE — Telephone Encounter (Signed)
Please call RX in fro xanax

## 2012-05-01 NOTE — Telephone Encounter (Signed)
RX called to [facility].

## 2012-05-16 ENCOUNTER — Encounter: Payer: Self-pay | Admitting: Vascular Surgery

## 2012-05-17 ENCOUNTER — Ambulatory Visit (INDEPENDENT_AMBULATORY_CARE_PROVIDER_SITE_OTHER): Payer: Medicare Other | Admitting: Vascular Surgery

## 2012-05-17 ENCOUNTER — Encounter: Payer: Self-pay | Admitting: Vascular Surgery

## 2012-05-17 ENCOUNTER — Encounter (INDEPENDENT_AMBULATORY_CARE_PROVIDER_SITE_OTHER): Payer: Medicare Other | Admitting: *Deleted

## 2012-05-17 VITALS — BP 101/67 | HR 74 | Resp 14 | Ht 66.0 in | Wt 126.0 lb

## 2012-05-17 DIAGNOSIS — I739 Peripheral vascular disease, unspecified: Secondary | ICD-10-CM

## 2012-05-17 DIAGNOSIS — M79609 Pain in unspecified limb: Secondary | ICD-10-CM | POA: Diagnosis not present

## 2012-05-17 DIAGNOSIS — W19XXXA Unspecified fall, initial encounter: Secondary | ICD-10-CM

## 2012-05-17 DIAGNOSIS — Y92009 Unspecified place in unspecified non-institutional (private) residence as the place of occurrence of the external cause: Secondary | ICD-10-CM

## 2012-05-17 HISTORY — DX: Unspecified fall, initial encounter: W19.XXXA

## 2012-05-17 HISTORY — DX: Unspecified place in unspecified non-institutional (private) residence as the place of occurrence of the external cause: Y92.009

## 2012-05-17 NOTE — Progress Notes (Signed)
[facility] [place] HISTORY AND PHYSICAL    History of Present Illness:  Patient is a 79 y.o. year old female who presents for evaluation of lower extremity swelling and pain.  The patient has had intermittent swelling of her lower extremities for one year. She has also developed weakness in her lower extremities with ambulation. This occurs approximately one half block. This is also going on approximately one year. The patient has a heavy smoking history in the past. She currently smokes one half pack of cigarettes per day. She does not really seem interested in quitting. Greater than 3 minutes they were spent regarding smoking cessation counseling. She denies rest pain. However she does describe a sensation under her feet of walking on gravel which sounds like neuropathy. Other medical problems include recent refractory diarrhea and diverticulitis. She currently is still being followed by Dr. Leone Payor with GI for this. She also has a history of COPD, depression anxiety, hyperlipidemia, peptic ulcer disease, severe pneumonia requiring ventilator.  All these are currently stable. Her COPD I would classify as a moderate to severe. Her legs do give way prior to her becoming short of breath but not much before.  She develops dyspnea with minimal exertion. She denies prior history of myocardial infarction or stroke.  All of her symptoms and medical history are essentially unchanged from her last office visit with me 3 months ago.    Past Medical History   Diagnosis  Date   .  Anxiety disorder     .  GERD (gastroesophageal reflux disease)     .  COPD (chronic obstructive pulmonary disease)     .  Depression     .  Hyperlipidemia     .  Peptic ulcer disease     .  Pneumonia     .  Pulmonary embolism     .  Colon adenoma     .  Pseudotumor cerebri     .  Irritable bowel syndrome     .  Lymphocytic colitis  12/15/2011       Past Surgical History   Procedure  Date   .   Cholecystectomy     .  Colon resection     .  Abdominal hysterectomy     .  Brain surgery         10 YEARS AGO    .  Cataract extraction         BOTH EYES   .  Upper gastrointestinal endoscopy     .  Colonoscopy        Social History History   Substance Use Topics   .  Smoking status:  Current Every Day Smoker -- 0.5 packs/day       Types:  Cigarettes   .  Smokeless tobacco:  Never Used         Comment: pt states "I will wait until all of this is over"   .  Alcohol Use:  No     Family History Family History   Problem  Relation  Age of Onset   .  Lung cancer  Father     .  Colon cancer  Neg Hx     .  Cancer  Mother     .  Hyperlipidemia  Mother     .  Hypertension  Mother     .  Heart attack  Mother     .  Hyperlipidemia  Sister  Allergies    Allergies   Allergen  Reactions   .  Codeine     .  Penicillins     .  Sulfa Antibiotics          Current Outpatient Prescriptions   Medication  Sig  Dispense  Refill   .  albuterol (PROVENTIL HFA;VENTOLIN HFA) 108 (90 BASE) MCG/ACT inhaler  Inhale 2 puffs into the lungs every 6 (six) hours as needed.         .  ALPRAZolam (XANAX) 0.5 MG tablet  Take 0.5 mg by mouth daily.         Marland Kitchen  aspirin 81 MG tablet  Take 81 mg by mouth daily.         .  colestipol (COLESTID) 5 G granules  Take 5 g by mouth 2 (two) times daily with a meal. Lunch and supper   500 g   12   .  pantoprazole (PROTONIX) 40 MG tablet  Take 40 mg by mouth daily.         .  rosuvastatin (CRESTOR) 40 MG tablet  Take 40 mg by mouth daily.         .  solifenacin (VESICARE) 5 MG tablet  Take 10 mg by mouth daily.         Marland Kitchen  triamterene-hydrochlorothiazide (DYAZIDE) 37.5-25 MG per capsule  Take 1 capsule by mouth every morning.         Marland Kitchen  esomeprazole (NEXIUM) 40 MG capsule  Take 40 mg by mouth daily before breakfast.         .  loperamide (IMODIUM A-D) 2 MG tablet  Take 1 tablet (2 mg total) by mouth 4 (four) times daily as needed for diarrhea or loose stools.    30 tablet   0     ROS:    General:  No weight loss, +Fever, +chills  HEENT: No recent headaches, no nasal bleeding, no visual changes, no sore throat  Neurologic: No dizziness, blackouts, seizures. No recent symptoms of stroke or mini- stroke. No recent episodes of slurred speech, or temporary blindness.  Cardiac: No recent episodes of chest pain/pressure, no shortness of breath at rest.  + shortness of breath with exertion.  Denies history of atrial fibrillation or irregular heartbeat  Vascular: No history of rest pain in feet.  + history of claudication.  No history of non-healing ulcer, No history of DVT, does have history of pulmonary embolus    Pulmonary: No home oxygen, no productive cough, no hemoptysis,  + asthma or wheezing  Musculoskeletal:  [ ]  Arthritis, [ ]  Low back pain,  [ ]  Joint pain  Hematologic:No history of hypercoagulable state.  No history of easy bleeding.  No history of anemia  Gastrointestinal: No hematochezia or melena,  No gastroesophageal reflux, no trouble swallowing  Urinary: [ ]  chronic Kidney disease, [ ]  on HD - [ ]  MWF or [ ]  TTHS, [ ]  Burning with urination, [ ]  Frequent urination, [ ]  Difficulty urinating;    Skin: No rashes  Psychological: + history of anxiety,  + history of depression   Physical Examination Filed Vitals:   05/17/12 1537  BP: 101/67  Pulse: 74  Resp: 14  Height: 5\' 6"  (1.676 m)  Weight: 126 lb (57.153 kg)  SpO2: 94%   Body mass index is 22.62 kg/(m^2).  General:  Alert and oriented, no acute distress HEENT: Normal Abdomen: Soft, non-tender, non-distended, no mass Skin: No rash Extremity Pulses:  2+  radial, brachial, 1+ femoral, absent popliteal dorsalis pedis, posterior tibial pulses bilaterally Musculoskeletal: No deformity or edema     Neurologic: Upper and lower extremity motor 5/5 and symmetric  DATA: The patient had an arterial duplex scan today as well as ABIs. This showed bilateral superficial femoral  artery occlusive disease with total occlusion of the right superficial femoral artery hemodynamically significant stenosis of the left superficial femoral artery.  ABI on the right was 0.57 left was 0.65 I reviewed and interpreted this study   ASSESSMENT: Bilateral lower extremity pain. This seems to be a larger component than the swelling is for her. Most likely this represents arterial occlusive disease in both lower extremities. She probably has primarily superficial femoral occlusive disease.   PLAN:  I discussed several options with the patient and her family today. I emphasized to her the importance of smoking cessation. I also discussed with her starting a walking program of 30 minutes daily. Her friend with her at the office the however think she will not be able to complete this. We also did discuss the possibility of an arteriogram to further evaluate her arterial tree. The patient was reluctant to proceed with arteriography or intervention at this point. She will followup with me with repeat ABIs in 6 months. I did emphasize to her that she is currently is not at risk of limb loss with the amount of perfusion that she is getting to her feet.  Fabienne Bruns, MD [facility] [place] Office: [phone] Pager: [phone]

## 2012-05-24 DIAGNOSIS — L851 Acquired keratosis [keratoderma] palmaris et plantaris: Secondary | ICD-10-CM | POA: Diagnosis not present

## 2012-05-24 DIAGNOSIS — B351 Tinea unguium: Secondary | ICD-10-CM | POA: Diagnosis not present

## 2012-05-24 DIAGNOSIS — I70209 Unspecified atherosclerosis of native arteries of extremities, unspecified extremity: Secondary | ICD-10-CM | POA: Diagnosis not present

## 2012-06-01 ENCOUNTER — Other Ambulatory Visit: Payer: Self-pay | Admitting: Nurse Practitioner

## 2012-06-05 ENCOUNTER — Other Ambulatory Visit: Payer: Self-pay | Admitting: *Deleted

## 2012-06-05 MED ORDER — ALPRAZOLAM 0.5 MG PO TABS: 0.5000 mg | ORAL_TABLET | Freq: Two times a day (BID) | ORAL | Status: DC

## 2012-06-05 NOTE — Telephone Encounter (Signed)
Called to [facility] 

## 2012-06-05 NOTE — Telephone Encounter (Signed)
Please call in rx for xanax- pt needs to be seen for future refills

## 2012-06-05 NOTE — Telephone Encounter (Signed)
Last filled 05/01/12, last seen 01/08/13. Call [facility] if approved

## 2012-07-05 ENCOUNTER — Other Ambulatory Visit: Payer: Self-pay | Admitting: Nurse Practitioner

## 2012-07-06 ENCOUNTER — Other Ambulatory Visit: Payer: Self-pay | Admitting: *Deleted

## 2012-07-06 MED ORDER — ALPRAZOLAM 0.5 MG PO TABS: 0.5000 mg | ORAL_TABLET | Freq: Two times a day (BID) | ORAL | Status: DC

## 2012-07-06 NOTE — Telephone Encounter (Signed)
Patient last seen in office on 01-09-12. Rx last filled on 06-05-12 for #60. Please advise. If approved please have nurse phone in to [facility]

## 2012-07-06 NOTE — Telephone Encounter (Signed)
Please call in xanax with 2 refills 

## 2012-07-06 NOTE — Telephone Encounter (Signed)
rx called in

## 2012-07-18 ENCOUNTER — Telehealth: Payer: Self-pay | Admitting: Vascular Surgery

## 2012-07-18 NOTE — Telephone Encounter (Signed)
Spoke with pt. She said 845 am appt should be fine. She will check with her friend to be sure she could get a ride, and if not, will call back.

## 2012-07-18 NOTE — Telephone Encounter (Signed)
Message copied by Margaretmary Eddy on Wed Jul 18, 2012  3:20 PM ------      Message from: Phillips Odor      Created: Wed Jul 18, 2012  2:44 PM      Regarding: needs appt. w/ [facility]      Contact: [phone]       Please schedule appt. with Dr. Darrick Penna to discuss scheduling       Aortogram/ last seen 05/17/12; pt. called and is ready to have procedure. Per Dr. Darrick Penna, since hasn't been in office for over 1 month, needs office visit prior to scheduling.  ------

## 2012-07-30 ENCOUNTER — Other Ambulatory Visit: Payer: Self-pay | Admitting: Nurse Practitioner

## 2012-08-01 ENCOUNTER — Encounter: Payer: Self-pay | Admitting: Vascular Surgery

## 2012-08-01 NOTE — Telephone Encounter (Signed)
Last seen 12/13  MMM

## 2012-08-02 ENCOUNTER — Encounter: Payer: Self-pay | Admitting: Vascular Surgery

## 2012-08-02 ENCOUNTER — Ambulatory Visit (INDEPENDENT_AMBULATORY_CARE_PROVIDER_SITE_OTHER): Payer: Medicare Other | Admitting: Vascular Surgery

## 2012-08-02 ENCOUNTER — Encounter (HOSPITAL_COMMUNITY): Payer: Self-pay | Admitting: Pharmacy Technician

## 2012-08-02 VITALS — BP 119/50 | HR 77 | Ht 66.0 in | Wt 120.0 lb

## 2012-08-02 DIAGNOSIS — I70209 Unspecified atherosclerosis of native arteries of extremities, unspecified extremity: Secondary | ICD-10-CM | POA: Diagnosis not present

## 2012-08-02 DIAGNOSIS — B351 Tinea unguium: Secondary | ICD-10-CM | POA: Diagnosis not present

## 2012-08-02 DIAGNOSIS — L851 Acquired keratosis [keratoderma] palmaris et plantaris: Secondary | ICD-10-CM | POA: Diagnosis not present

## 2012-08-02 DIAGNOSIS — I739 Peripheral vascular disease, unspecified: Secondary | ICD-10-CM | POA: Diagnosis not present

## 2012-08-02 NOTE — Progress Notes (Signed)
[facility] [place]  HISTORY AND PHYSICAL  History of Present Illness: Patient is a 79 y.o. year old female who presents for followup evaluation of lower extremity swelling and pain. The patient has had intermittent swelling of her lower extremities for one year. She has also developed weakness in her lower extremities with ambulation. This occurs approximately one half block. This is also going on approximately one year. The patient has a heavy smoking history in the past. She currently smokes one half pack of cigarettes per day. She does not really seem interested in quitting. Greater than 3 minutes they were spent regarding smoking cessation counseling. She denies rest pain. However she does describe a sensation under her feet of walking on gravel which sounds like neuropathy. Other medical problems include recent refractory diarrhea and diverticulitis. She currently is still being followed by Dr. Gessner with GI for this. She also has a history of COPD, depression anxiety, hyperlipidemia, peptic ulcer disease, severe pneumonia requiring ventilator. All these are currently stable. Her COPD I would classify as a moderate to severe. Her legs do give way prior to her becoming short of breath but not much before. She develops dyspnea with minimal exertion. She denies prior history of myocardial infarction or stroke. All of her symptoms and medical history are essentially unchanged from her last office visit with me 2 months ago. She has decided at this point she wishes an intervention for her lower extremities and wishes to consider arteriography.  Past Medical History   Diagnosis  Date   .  Anxiety disorder    .  GERD (gastroesophageal reflux disease)    .  COPD (chronic obstructive pulmonary disease)    .  Depression    .  Hyperlipidemia    .  Peptic ulcer disease    .  Pneumonia    .  Pulmonary embolism    .  Colon adenoma    .  Pseudotumor cerebri    .  Irritable bowel  syndrome    .  Lymphocytic colitis  12/15/2011    Past Surgical History   Procedure  Date   .  Cholecystectomy    .  Colon resection    .  Abdominal hysterectomy    .  Brain surgery      10 YEARS AGO   .  Cataract extraction      BOTH EYES   .  Upper gastrointestinal endoscopy    .  Colonoscopy    Social History  History   Substance Use Topics   .  Smoking status:  Current Every Day Smoker -- 0.5 packs/day     Types:  Cigarettes   .  Smokeless tobacco:  Never Used      Comment: pt states "I will wait until all of this is over"   .  Alcohol Use:  No   Family History  Family History   Problem  Relation  Age of Onset   .  Lung cancer  Father    .  Colon cancer  Neg Hx    .  Cancer  Mother    .  Hyperlipidemia  Mother    .  Hypertension  Mother    .  Heart attack  Mother    .  Hyperlipidemia  Sister    Allergies  Allergies   Allergen  Reactions   .  Codeine    .  Penicillins    .  Sulfa Antibiotics       Current Outpatient Prescriptions   Medication  Sig  Dispense  Refill   .  albuterol (PROVENTIL HFA;VENTOLIN HFA) 108 (90 BASE) MCG/ACT inhaler  Inhale 2 puffs into the lungs every 6 (six) hours as needed.     .  ALPRAZolam (XANAX) 0.5 MG tablet  Take 0.5 mg by mouth daily.     .  aspirin 81 MG tablet  Take 81 mg by mouth daily.     .  colestipol (COLESTID) 5 G granules  Take 5 g by mouth 2 (two) times daily with a meal. Lunch and supper  500 g  12   .  pantoprazole (PROTONIX) 40 MG tablet  Take 40 mg by mouth daily.     .  rosuvastatin (CRESTOR) 40 MG tablet  Take 40 mg by mouth daily.     .  solifenacin (VESICARE) 5 MG tablet  Take 10 mg by mouth daily.     .  triamterene-hydrochlorothiazide (DYAZIDE) 37.5-25 MG per capsule  Take 1 capsule by mouth every morning.     .  esomeprazole (NEXIUM) 40 MG capsule  Take 40 mg by mouth daily before breakfast.     .  loperamide (IMODIUM A-D) 2 MG tablet  Take 1 tablet (2 mg total) by mouth 4 (four) times daily as needed for diarrhea  or loose stools.  30 tablet  0   ROS:  General: + weight loss  HEENT: No recent headaches, no nasal bleeding, no visual changes, no sore throat  Neurologic: No dizziness, blackouts, seizures. No recent symptoms of stroke or mini- stroke. No recent episodes of slurred speech, or temporary blindness.  Cardiac: No recent episodes of chest pain/pressure, no shortness of breath at rest. + shortness of breath with exertion. Denies history of atrial fibrillation or irregular heartbeat  Vascular: No history of rest pain in feet. + history of claudication. No history of non-healing ulcer, No history of DVT, does have history of pulmonary embolus  Pulmonary: No home oxygen, no productive cough, no hemoptysis, + asthma or wheezing  Musculoskeletal: [ ] Arthritis, [ ] Low back pain, [ ] Joint pain  Hematologic:No history of hypercoagulable state. No history of easy bleeding. No history of anemia  Gastrointestinal: No hematochezia or melena, No gastroesophageal reflux, no trouble swallowing,  intermittent episodes of abdominal pain, denies food fear. Denies postprandial abdominal pain. Abdominal pain is chronic in nature. Urinary: [ ] chronic Kidney disease, [ ] on HD - [ ] MWF or [ ] TTHS, [ ] Burning with urination, [ ] Frequent urination, [ ] Difficulty urinating;  Skin: No rashes  Psychological: + history of anxiety, + history of depression  Physical Examination   Filed Vitals:   08/02/12 0906  BP: 119/50  Pulse: 77  Height: 5' 6" (1.676 m)  Weight: 120 lb (54.432 kg)  SpO2: 98%  General: Alert and oriented, no acute distress  HEENT: Normal  Abdomen: Soft, non-tender, non-distended, no mass  Skin: No rash,  inclusion cyst left groin Extremity Pulses: 2+ radial, brachial, 1+ femoral, absent popliteal dorsalis pedis, posterior tibial pulses bilaterally  Musculoskeletal: No deformity or edema  Neurologic: Upper and lower extremity motor 5/5 and symmetric   DATA: Prior ABIs in duplex review This  showed bilateral superficial femoral artery occlusive disease with total occlusion of the right superficial femoral artery hemodynamically significant stenosis of the left superficial femoral artery. ABI on the right was 0.57 left was 0.65   ASSESSMENT: Bilateral lower extremity pain. This seems to be   a larger component than the swelling is for her. Most likely this represents arterial occlusive disease in both lower extremities. She probably has primarily superficial femoral occlusive disease.   PLAN: I discussed several options with the patient and her family today. I emphasized to her the importance of smoking cessation. I also discussed with her starting a walking program of 30 minutes daily. Her friend with her at the office the however think she will not be able to complete this. We also did discuss the possibility of an arteriogram to further evaluate her arterial tree.  I did emphasize to her that she is currently is not at risk of limb loss with the amount of perfusion that she is getting to her feet. I also emphasized to her today the procedure details risks benefits possible complications including but not limited to bleeding infection limb loss worsening claudication symptoms decreased durability with continued smoking. She wishes at this point to pursue arteriography possible intervention. This is scheduled for Friday, 08/10/2012.  Edi Gorniak, MD  [facility] [place]  Office: [phone]  Pager: [phone]  

## 2012-08-03 ENCOUNTER — Other Ambulatory Visit: Payer: Self-pay

## 2012-08-10 ENCOUNTER — Telehealth: Payer: Self-pay | Admitting: Vascular Surgery

## 2012-08-10 ENCOUNTER — Encounter (HOSPITAL_COMMUNITY)
Admission: RE | Disposition: A | Discharge status: Home / Self Care | Payer: Self-pay | Source: Ambulatory Visit | Attending: Vascular Surgery

## 2012-08-10 ENCOUNTER — Other Ambulatory Visit: Payer: Self-pay | Admitting: *Deleted

## 2012-08-10 ENCOUNTER — Ambulatory Visit (HOSPITAL_COMMUNITY)
Admission: RE | Admit: 2012-08-10 | Discharge: 2012-08-10 | Disposition: A | Discharge status: Home / Self Care | Payer: Medicare Other | Source: Ambulatory Visit | Attending: Vascular Surgery | Admitting: Vascular Surgery

## 2012-08-10 DIAGNOSIS — Z7982 Long term (current) use of aspirin: Secondary | ICD-10-CM | POA: Insufficient documentation

## 2012-08-10 DIAGNOSIS — Z880 Allergy status to penicillin: Secondary | ICD-10-CM | POA: Diagnosis not present

## 2012-08-10 DIAGNOSIS — F411 Generalized anxiety disorder: Secondary | ICD-10-CM | POA: Insufficient documentation

## 2012-08-10 DIAGNOSIS — Z885 Allergy status to narcotic agent status: Secondary | ICD-10-CM | POA: Diagnosis not present

## 2012-08-10 DIAGNOSIS — I7092 Chronic total occlusion of artery of the extremities: Secondary | ICD-10-CM | POA: Diagnosis not present

## 2012-08-10 DIAGNOSIS — F329 Major depressive disorder, single episode, unspecified: Secondary | ICD-10-CM | POA: Insufficient documentation

## 2012-08-10 DIAGNOSIS — J449 Chronic obstructive pulmonary disease, unspecified: Secondary | ICD-10-CM | POA: Insufficient documentation

## 2012-08-10 DIAGNOSIS — K219 Gastro-esophageal reflux disease without esophagitis: Secondary | ICD-10-CM | POA: Diagnosis not present

## 2012-08-10 DIAGNOSIS — F17200 Nicotine dependence, unspecified, uncomplicated: Secondary | ICD-10-CM | POA: Insufficient documentation

## 2012-08-10 DIAGNOSIS — Z79899 Other long term (current) drug therapy: Secondary | ICD-10-CM | POA: Insufficient documentation

## 2012-08-10 DIAGNOSIS — F3289 Other specified depressive episodes: Secondary | ICD-10-CM | POA: Insufficient documentation

## 2012-08-10 DIAGNOSIS — K589 Irritable bowel syndrome without diarrhea: Secondary | ICD-10-CM | POA: Diagnosis not present

## 2012-08-10 DIAGNOSIS — Z882 Allergy status to sulfonamides status: Secondary | ICD-10-CM | POA: Insufficient documentation

## 2012-08-10 DIAGNOSIS — Z86711 Personal history of pulmonary embolism: Secondary | ICD-10-CM | POA: Insufficient documentation

## 2012-08-10 DIAGNOSIS — I70219 Atherosclerosis of native arteries of extremities with intermittent claudication, unspecified extremity: Secondary | ICD-10-CM | POA: Insufficient documentation

## 2012-08-10 DIAGNOSIS — I708 Atherosclerosis of other arteries: Secondary | ICD-10-CM | POA: Insufficient documentation

## 2012-08-10 DIAGNOSIS — J4489 Other specified chronic obstructive pulmonary disease: Secondary | ICD-10-CM | POA: Insufficient documentation

## 2012-08-10 DIAGNOSIS — I739 Peripheral vascular disease, unspecified: Secondary | ICD-10-CM

## 2012-08-10 DIAGNOSIS — E785 Hyperlipidemia, unspecified: Secondary | ICD-10-CM | POA: Diagnosis not present

## 2012-08-10 DIAGNOSIS — Z8249 Family history of ischemic heart disease and other diseases of the circulatory system: Secondary | ICD-10-CM | POA: Insufficient documentation

## 2012-08-10 DIAGNOSIS — Z8701 Personal history of pneumonia (recurrent): Secondary | ICD-10-CM | POA: Insufficient documentation

## 2012-08-10 DIAGNOSIS — Z48812 Encounter for surgical aftercare following surgery on the circulatory system: Secondary | ICD-10-CM

## 2012-08-10 HISTORY — PX: ABDOMINAL AORTAGRAM: SHX5454

## 2012-08-10 HISTORY — PX: INSERTION OF ILIAC STENT: SHX6256

## 2012-08-10 LAB — POCT ACTIVATED CLOTTING TIME
Activated Clotting Time: 252 seconds
Activated Clotting Time: 278 seconds

## 2012-08-10 LAB — POCT I-STAT, CHEM 8
Calcium, Ion: 1.14 mmol/L (ref 1.13–1.30)
Chloride: 100 mEq/L (ref 96–112)
HCT: 42 % (ref 36.0–46.0)
Hemoglobin: 14.3 g/dL (ref 12.0–15.0)
TCO2: 29 mmol/L (ref 0–100)

## 2012-08-10 SURGERY — ABDOMINAL AORTAGRAM
Anesthesia: LOCAL

## 2012-08-10 MED ORDER — MORPHINE SULFATE 10 MG/ML IJ SOLN: 2.0000 mg | INTRAMUSCULAR | Status: DC | PRN

## 2012-08-10 MED ORDER — DEXTROSE-NACL 5-0.45 % IV SOLN: INTRAVENOUS | Status: DC

## 2012-08-10 MED ORDER — FENTANYL CITRATE 0.05 MG/ML IJ SOLN
INTRAMUSCULAR | Status: AC
  Filled 2012-08-10: qty 2

## 2012-08-10 MED ORDER — METOPROLOL TARTRATE 1 MG/ML IV SOLN: 2.0000 mg | INTRAVENOUS | Status: DC | PRN

## 2012-08-10 MED ORDER — LIDOCAINE HCL (PF) 1 % IJ SOLN
INTRAMUSCULAR | Status: AC
  Filled 2012-08-10: qty 30

## 2012-08-10 MED ORDER — DOCUSATE SODIUM 100 MG PO CAPS: 100.0000 mg | ORAL_CAPSULE | Freq: Every day | ORAL | Status: DC

## 2012-08-10 MED ORDER — HEPARIN SODIUM (PORCINE) 1000 UNIT/ML IJ SOLN
INTRAMUSCULAR | Status: AC
  Filled 2012-08-10: qty 1

## 2012-08-10 MED ORDER — HYDRALAZINE HCL 20 MG/ML IJ SOLN: 10.0000 mg | INTRAMUSCULAR | Status: DC | PRN

## 2012-08-10 MED ORDER — ALPRAZOLAM 0.25 MG PO TABS
0.5000 mg | ORAL_TABLET | Freq: Once | ORAL | Status: AC
  Administered 2012-08-10: 0.5 mg via ORAL

## 2012-08-10 MED ORDER — ONDANSETRON HCL 4 MG/2ML IJ SOLN: 4.0000 mg | Freq: Four times a day (QID) | INTRAMUSCULAR | Status: DC | PRN

## 2012-08-10 MED ORDER — ACETAMINOPHEN 325 MG PO TABS: 325.0000 mg | ORAL_TABLET | ORAL | Status: DC | PRN

## 2012-08-10 MED ORDER — ALPRAZOLAM 0.5 MG PO TABS
0.5000 mg | ORAL_TABLET | Freq: Once | ORAL | Status: DC | PRN
  Filled 2012-08-10: qty 2

## 2012-08-10 MED ORDER — SODIUM CHLORIDE 0.9 % IV SOLN
INTRAVENOUS | Status: DC
  Administered 2012-08-10: 07:00:00 via INTRAVENOUS

## 2012-08-10 MED ORDER — LABETALOL HCL 5 MG/ML IV SOLN: 10.0000 mg | INTRAVENOUS | Status: DC | PRN

## 2012-08-10 MED ORDER — ACETAMINOPHEN 325 MG RE SUPP: 325.0000 mg | RECTAL | Status: DC | PRN

## 2012-08-10 MED ORDER — HEPARIN (PORCINE) IN NACL 2-0.9 UNIT/ML-% IJ SOLN
INTRAMUSCULAR | Status: AC
  Filled 2012-08-10: qty 1000

## 2012-08-10 MED ORDER — ASPIRIN 325 MG PO TABS: 325.0000 mg | ORAL_TABLET | Freq: Every day | ORAL | Status: DC

## 2012-08-10 MED ORDER — MIDAZOLAM HCL 2 MG/2ML IJ SOLN
INTRAMUSCULAR | Status: AC
  Filled 2012-08-10: qty 2

## 2012-08-10 NOTE — Interval H&P Note (Signed)
History and Physical Interval Note:  08/10/2012 8:01 AM  [name]  has presented today for surgery, with the diagnosis of pvd  The various methods of treatment have been discussed with the patient and family. After consideration of risks, benefits and other options for treatment, the patient has consented to  Procedure(s): ABDOMINAL AORTAGRAM (N/A) as a surgical intervention .  The patient's history has been reviewed, patient examined, no change in status, stable for surgery.  I have reviewed the patient's chart and labs.  Questions were answered to the patient's satisfaction.     [name]

## 2012-08-10 NOTE — Telephone Encounter (Addendum)
Message copied by Fredrich Birks on Fri Aug 10, 2012  2:51 PM ------      Message from: Sherren Kerns      Created: Fri Aug 10, 2012 10:05 AM       Aortogram with bilat runoff      Ultrasound      Left external iliac stent x 1      Right external iliac stent x 2            She needs follow up bilat lower extremity duplex ABI and office visit in 6 months            Charles ------  08/10/2012: Left message regarding 02/2013 follow up on home #, [id number]

## 2012-08-10 NOTE — H&P (View-Only) (Signed)
[facility] [place]  HISTORY AND PHYSICAL  History of Present Illness: Patient is a 79 y.o. year old female who presents for followup evaluation of lower extremity swelling and pain. The patient has had intermittent swelling of her lower extremities for one year. She has also developed weakness in her lower extremities with ambulation. This occurs approximately one half block. This is also going on approximately one year. The patient has a heavy smoking history in the past. She currently smokes one half pack of cigarettes per day. She does not really seem interested in quitting. Greater than 3 minutes they were spent regarding smoking cessation counseling. She denies rest pain. However she does describe a sensation under her feet of walking on gravel which sounds like neuropathy. Other medical problems include recent refractory diarrhea and diverticulitis. She currently is still being followed by Dr. Leone Payor with GI for this. She also has a history of COPD, depression anxiety, hyperlipidemia, peptic ulcer disease, severe pneumonia requiring ventilator. All these are currently stable. Her COPD I would classify as a moderate to severe. Her legs do give way prior to her becoming short of breath but not much before. She develops dyspnea with minimal exertion. She denies prior history of myocardial infarction or stroke. All of her symptoms and medical history are essentially unchanged from her last office visit with me 2 months ago. She has decided at this point she wishes an intervention for her lower extremities and wishes to consider arteriography.  Past Medical History   Diagnosis  Date   .  Anxiety disorder    .  GERD (gastroesophageal reflux disease)    .  COPD (chronic obstructive pulmonary disease)    .  Depression    .  Hyperlipidemia    .  Peptic ulcer disease    .  Pneumonia    .  Pulmonary embolism    .  Colon adenoma    .  Pseudotumor cerebri    .  Irritable bowel  syndrome    .  Lymphocytic colitis  12/15/2011    Past Surgical History   Procedure  Date   .  Cholecystectomy    .  Colon resection    .  Abdominal hysterectomy    .  Brain surgery      10 YEARS AGO   .  Cataract extraction      BOTH EYES   .  Upper gastrointestinal endoscopy    .  Colonoscopy    Social History  History   Substance Use Topics   .  Smoking status:  Current Every Day Smoker -- 0.5 packs/day     Types:  Cigarettes   .  Smokeless tobacco:  Never Used      Comment: pt states "I will wait until all of this is over"   .  Alcohol Use:  No   Family History  Family History   Problem  Relation  Age of Onset   .  Lung cancer  Father    .  Colon cancer  Neg Hx    .  Cancer  Mother    .  Hyperlipidemia  Mother    .  Hypertension  Mother    .  Heart attack  Mother    .  Hyperlipidemia  Sister    Allergies  Allergies   Allergen  Reactions   .  Codeine    .  Penicillins    .  Sulfa Antibiotics  Current Outpatient Prescriptions   Medication  Sig  Dispense  Refill   .  albuterol (PROVENTIL HFA;VENTOLIN HFA) 108 (90 BASE) MCG/ACT inhaler  Inhale 2 puffs into the lungs every 6 (six) hours as needed.     .  ALPRAZolam (XANAX) 0.5 MG tablet  Take 0.5 mg by mouth daily.     Marland Kitchen  aspirin 81 MG tablet  Take 81 mg by mouth daily.     .  colestipol (COLESTID) 5 G granules  Take 5 g by mouth 2 (two) times daily with a meal. Lunch and supper  500 g  12   .  pantoprazole (PROTONIX) 40 MG tablet  Take 40 mg by mouth daily.     .  rosuvastatin (CRESTOR) 40 MG tablet  Take 40 mg by mouth daily.     .  solifenacin (VESICARE) 5 MG tablet  Take 10 mg by mouth daily.     Marland Kitchen  triamterene-hydrochlorothiazide (DYAZIDE) 37.5-25 MG per capsule  Take 1 capsule by mouth every morning.     Marland Kitchen  esomeprazole (NEXIUM) 40 MG capsule  Take 40 mg by mouth daily before breakfast.     .  loperamide (IMODIUM A-D) 2 MG tablet  Take 1 tablet (2 mg total) by mouth 4 (four) times daily as needed for diarrhea  or loose stools.  30 tablet  0   ROS:  General: + weight loss  HEENT: No recent headaches, no nasal bleeding, no visual changes, no sore throat  Neurologic: No dizziness, blackouts, seizures. No recent symptoms of stroke or mini- stroke. No recent episodes of slurred speech, or temporary blindness.  Cardiac: No recent episodes of chest pain/pressure, no shortness of breath at rest. + shortness of breath with exertion. Denies history of atrial fibrillation or irregular heartbeat  Vascular: No history of rest pain in feet. + history of claudication. No history of non-healing ulcer, No history of DVT, does have history of pulmonary embolus  Pulmonary: No home oxygen, no productive cough, no hemoptysis, + asthma or wheezing  Musculoskeletal: [ ]  Arthritis, [ ]  Low back pain, [ ]  Joint pain  Hematologic:No history of hypercoagulable state. No history of easy bleeding. No history of anemia  Gastrointestinal: No hematochezia or melena, No gastroesophageal reflux, no trouble swallowing,  intermittent episodes of abdominal pain, denies food fear. Denies postprandial abdominal pain. Abdominal pain is chronic in nature. Urinary: [ ]  chronic Kidney disease, [ ]  on HD - [ ]  MWF or [ ]  TTHS, [ ]  Burning with urination, [ ]  Frequent urination, [ ]  Difficulty urinating;  Skin: No rashes  Psychological: + history of anxiety, + history of depression  Physical Examination   Filed Vitals:   08/02/12 0906  BP: 119/50  Pulse: 77  Height: 5\' 6"  (1.676 m)  Weight: 120 lb (54.432 kg)  SpO2: 98%  General: Alert and oriented, no acute distress  HEENT: Normal  Abdomen: Soft, non-tender, non-distended, no mass  Skin: No rash,  inclusion cyst left groin Extremity Pulses: 2+ radial, brachial, 1+ femoral, absent popliteal dorsalis pedis, posterior tibial pulses bilaterally  Musculoskeletal: No deformity or edema  Neurologic: Upper and lower extremity motor 5/5 and symmetric   DATA: Prior ABIs in duplex review This  showed bilateral superficial femoral artery occlusive disease with total occlusion of the right superficial femoral artery hemodynamically significant stenosis of the left superficial femoral artery. ABI on the right was 0.57 left was 0.65   ASSESSMENT: Bilateral lower extremity pain. This seems to be  a larger component than the swelling is for her. Most likely this represents arterial occlusive disease in both lower extremities. She probably has primarily superficial femoral occlusive disease.   PLAN: I discussed several options with the patient and her family today. I emphasized to her the importance of smoking cessation. I also discussed with her starting a walking program of 30 minutes daily. Her friend with her at the office the however think she will not be able to complete this. We also did discuss the possibility of an arteriogram to further evaluate her arterial tree.  I did emphasize to her that she is currently is not at risk of limb loss with the amount of perfusion that she is getting to her feet. I also emphasized to her today the procedure details risks benefits possible complications including but not limited to bleeding infection limb loss worsening claudication symptoms decreased durability with continued smoking. She wishes at this point to pursue arteriography possible intervention. This is scheduled for Friday, 08/10/2012.  Fabienne Bruns, MD  [facility] [place]  Office: [phone]  Pager: [phone]

## 2012-08-10 NOTE — Progress Notes (Signed)
UP AND WALKED AND TOL WELL AND RIGHT GROIN STABLE; NO BLEEDING OR HEMATOMA 

## 2012-08-10 NOTE — Progress Notes (Signed)
Voided incontinently

## 2012-08-10 NOTE — Op Note (Signed)
Procedure: Aortogram with bilateral lower extremity runoff, right external iliac stent x2, left external iliac stent  Preoperative diagnosis: Claudication  Postoperative diagnosis: Same  Anesthesia: Local with IV sedation  Operative findings: #1 7 x 30 self-expanding Cordis stent left external iliac, 7 x 40 Self-expanding Cordis stent right external iliac, 6 x 30 Abbott self-expanding right external iliac  #2 right superficial femoral artery occlusion with three-vessel runoff to the right foot #3 irregular left superficial femoral artery but patent and three-vessel runoff to the foot  Operative details: After obtaining informed consent, the patient was taken to the PV lab. The patient was placed in supine position on the Angio table. Ultrasound was used to identify the right common femoral artery. Local anesthesia was infiltrated over the right common femoral artery. The right common femoral artery was cannulated under ultrasound guidance. The artery was fairly mobile so this took a few attempts. Next an 035 first core wire was noted up into the abdominal aorta under fluoroscopic guidance. A 5 French sheath was placed over the guidewire the right common femoral artery. 5 French catheter was then placed over the guidewire and the abdominal aorta and abdominal aortogram obtained. This is the left and right renal arteries are patent. The infrarenal abdominal aortic. Left and right common iliac arteries are patent. The left and right internal iliac arteries are patent. The left and right external iliac arteries are patent but both of these have diffuse long stenoses approaching 80%. Next a pigtail catheter was pulled down to the aortic bifurcation and oblique views of the pelvis were obtained. This again confirms the above findings. Bilateral high grade external iliac artery stenoses. Lower extremity runoff views were then performed.  In the right lower extremity, the common femoral artery is patent. The  right profunda femoris artery is patent. The right superficial femoral artery is occluded at its origin. The popliteal artery reconstitutes via profunda collaterals. There is three-vessel runoff to the right foot however the distal runoff is poorly visualized due to to contrast dilution.  In the left lower extremity the left common femoral artery is patent. The left profunda femoris artery is patent. The left superficial femoral artery is patent but diffusely diseased. There multiple segments of 50-70% stenosis. There is three-vessel runoff to the left foot.  At this point it was decided to intervene on the iliac artery stenoses bilaterally. The 5 French sheath was exchanged for a 7 [place] Ansel sheath. The patient was given 5000 units of intravenous heparin. ACT was confirmed to be greater than 200. 5 [place] crossover catheter was used to selectively catheterize the left common iliac artery and the 035 first core wire threaded across the left common iliac and across the lesion of the left external iliac artery to the left superficial femoral artery. The sheath was then advanced up and over the aortic bifurcation. A 7 x 30 self-expanding Cordis stent was positioned at the area of stenosis. This was then deployed in the usual fashion and post dilated with a 6 balloon. Completion arteriogram showed well opposed stent with no evidence of dissection. The sheath was then pulled back into the right external iliac system. A 7 x 40 self-expanding Cordis then was brought now for field and centered over the right external iliac lesion. Due to the very proximal aspect of this lesion we had to put a stent with partial overlap of the internal iliac artery. This was deployed in the usual fashion and then molded with a 6 balloon. Completion arteriogram  showed good wall apposition with no evidence of dissection. However there was still a 70% stenosis of the distal external iliac artery below this. This was approximately 2 cm  above the inguinal ligament. Therefore an Abbott self-expanding stent was brought in operative field as I thought we would have better control and positioning of this. This was then deployed with slight overlap into the previous stent and extended into the distal external iliac artery. This was then molded again with a 6 mm balloon. Completion arteriogram showed good wall apposition and overlap of the stents with flow into the internal iliac artery and a widely patent right external iliac artery. the 7 [place] sheath was exchanged for a 7 [place] short sheath over a wire. However, this was still oozing some around the sheath so this was exchanged for an 8 [place] short sheath. One final view was obtained of the right leg to try to get better opacification of the distal tibial vessels but they were still contrast dilution. There is three-vessel runoff proximally although this is not well visualized below the ankle.  The patient tolerated the procedure well and there were no complications. The patient was taken to the holding area in stable condition.  Fabienne Bruns, MD [facility] [place] Office: [phone] Pager: [phone]

## 2012-08-16 ENCOUNTER — Telehealth: Payer: Self-pay

## 2012-08-16 NOTE — Telephone Encounter (Signed)
Pt. called to ask about approval per Dr. Darrick Penna to stop ASA x 2 days, prior to tooth extractions. (recommended by her dentist)  Recent procedure, 8/1,  done with stents placed in the Right EIA x 2, and Left EIA x 1, and rec'd instructions not to miss her daily dose of ASA 325 mg.  Discussed w/ Dr. Arbie Cookey, since Dr. Darrick Penna not avail.  Recommended to delay dental procedure at this time, and not to interrupt the ASA x 30 days, post stenting.  Also advised to get further recommendations by Dr. Darrick Penna, when he returns from vacation.  Phone call back to pt.  Advised of recommendation not to stop ASA x 30 days, and that further recommendations to come from Dr. Darrick Penna, the week of 8/18.  Pt. Verb. Understanding.

## 2012-08-28 ENCOUNTER — Other Ambulatory Visit: Payer: Self-pay | Admitting: Nurse Practitioner

## 2012-08-28 ENCOUNTER — Other Ambulatory Visit: Payer: Self-pay | Admitting: Family Medicine

## 2012-08-28 NOTE — Telephone Encounter (Signed)
Sherren Kerns, MD - RE: recommendation on holding ASA ','<<< Less Detail       RE: recommendation on holding ASA  Solmon Ice, MD  MRN: [id number] DOB: 1932/07/30     Pt Home: [phone]     Sent: Sheral Flow August 27, 2012 11:22 PM     To: Conley Simmonds Mischa Pollard, RN                          Message    If extraction is urgent/emergent can hold ASA now. Would be best if she can wait 6 weeks prior to stopping aspirin      Leonette Most

## 2012-08-29 NOTE — Telephone Encounter (Signed)
Needs to be seen for labs 

## 2012-08-29 NOTE — Telephone Encounter (Signed)
LAST OV 12/13. LAST LABS 12/13. 

## 2012-08-29 NOTE — Telephone Encounter (Signed)
LAST OV 12/13. LAST LABS 12/13.

## 2012-08-30 NOTE — Telephone Encounter (Signed)
PT NOTIFIED  

## 2012-09-20 ENCOUNTER — Encounter: Payer: Self-pay | Admitting: Nurse Practitioner

## 2012-09-20 ENCOUNTER — Ambulatory Visit (INDEPENDENT_AMBULATORY_CARE_PROVIDER_SITE_OTHER): Payer: Medicare Other | Admitting: Nurse Practitioner

## 2012-09-20 VITALS — BP 106/59 | HR 77 | Temp 97.5°F | Ht 64.0 in | Wt 118.0 lb

## 2012-09-20 DIAGNOSIS — F411 Generalized anxiety disorder: Secondary | ICD-10-CM

## 2012-09-20 DIAGNOSIS — R3915 Urgency of urination: Secondary | ICD-10-CM

## 2012-09-20 DIAGNOSIS — E785 Hyperlipidemia, unspecified: Secondary | ICD-10-CM

## 2012-09-20 DIAGNOSIS — J449 Chronic obstructive pulmonary disease, unspecified: Secondary | ICD-10-CM

## 2012-09-20 DIAGNOSIS — I10 Essential (primary) hypertension: Secondary | ICD-10-CM

## 2012-09-20 DIAGNOSIS — M899 Disorder of bone, unspecified: Secondary | ICD-10-CM

## 2012-09-20 DIAGNOSIS — M8580 Other specified disorders of bone density and structure, unspecified site: Secondary | ICD-10-CM

## 2012-09-20 DIAGNOSIS — IMO0001 Reserved for inherently not codable concepts without codable children: Secondary | ICD-10-CM

## 2012-09-20 DIAGNOSIS — K219 Gastro-esophageal reflux disease without esophagitis: Secondary | ICD-10-CM

## 2012-09-20 MED ORDER — PANTOPRAZOLE SODIUM 40 MG PO TBEC: 40.0000 mg | DELAYED_RELEASE_TABLET | Freq: Every day | ORAL | Status: DC

## 2012-09-20 MED ORDER — ALPRAZOLAM 0.5 MG PO TABS: 0.5000 mg | ORAL_TABLET | Freq: Two times a day (BID) | ORAL | Status: DC

## 2012-09-20 MED ORDER — ROSUVASTATIN CALCIUM 10 MG PO TABS: 10.0000 mg | ORAL_TABLET | Freq: Every day | ORAL | Status: DC

## 2012-09-20 MED ORDER — OXYBUTYNIN CHLORIDE 5 MG PO TABS: 5.0000 mg | ORAL_TABLET | Freq: Two times a day (BID) | ORAL | Status: DC

## 2012-09-20 MED ORDER — TRIAMTERENE-HCTZ 37.5-25 MG PO CAPS: 1.0000 | ORAL_CAPSULE | Freq: Every day | ORAL | Status: DC

## 2012-09-20 MED ORDER — ALBUTEROL SULFATE HFA 108 (90 BASE) MCG/ACT IN AERS: 2.0000 | INHALATION_SPRAY | Freq: Two times a day (BID) | RESPIRATORY_TRACT | Status: DC | PRN

## 2012-09-20 MED ORDER — RALOXIFENE HCL 60 MG PO TABS: 60.0000 mg | ORAL_TABLET | ORAL | Status: DC

## 2012-09-20 NOTE — Progress Notes (Signed)
Subjective:    Patient ID: [name], female    DOB: 1932-03-22, 79 y.o.   MRN: [id number]  HPI  Patient here today for follow up of multiple medical problems- Patient says she is doing good right now- Had stents put in both legs about 2 weeks ago for circulatory problems- Has really helped her leg cramping- She says her COPD is doing okay right now. No other complaints today. Patient Active Problem List   Diagnosis Date Noted  . Peripheral vascular disease, unspecified 05/17/2012  . Pain in limb 05/17/2012  . Edema 02/16/2012  . IBS (irritable bowel syndrome) 02/09/2012  . Lymphocytic colitis 12/15/2011  . Personal history of adenomatous colonic polyps 12/02/2011  . GERD 10/20/2008   Outpatient Encounter Prescriptions as of 09/20/2012  Medication Sig Dispense Refill  . albuterol (PROVENTIL HFA;VENTOLIN HFA) 108 (90 BASE) MCG/ACT inhaler Inhale 2 puffs into the lungs 2 (two) times daily as needed for wheezing or shortness of breath.       . ALPRAZolam (XANAX) 0.5 MG tablet Take 1 tablet (0.5 mg total) by mouth 2 (two) times daily.  60 tablet  0  . aspirin EC 325 MG tablet Take 325 mg by mouth daily.      . Cholecalciferol (VITAMIN D) 2000 UNITS tablet Take 2,000 Units by mouth daily.      . colestipol (COLESTID) 1 G tablet Take 2 g by mouth See admin instructions. Takes once or twice a day depending on bowel movements      . CRANBERRY PO Take 1 tablet by mouth daily.      . CRESTOR 10 MG tablet TAKE 1 TABLET DAILY  30 tablet  0  . Guaifenesin (MUCINEX MAXIMUM STRENGTH) 1200 MG TB12 Take 1,200 mg by mouth every morning.      Marland Kitchen guaiFENesin (MUCINEX) 600 MG 12 hr tablet Take 600 mg by mouth every evening.       Marland Kitchen oxybutynin (DITROPAN) 5 MG tablet Take 5 mg by mouth 2 (two) times daily.      . pantoprazole (PROTONIX) 40 MG tablet Take 40 mg by mouth daily.      Bertram Gala Glycol-Propyl Glycol (SYSTANE OP) Place 1 drop into both eyes daily.      . raloxifene (EVISTA) 60 MG tablet Take 60  mg by mouth every other day.      . rosuvastatin (CRESTOR) 10 MG tablet Take 10 mg by mouth daily.      . solifenacin (VESICARE) 5 MG tablet Take 10 mg by mouth daily.      Marland Kitchen triamterene-hydrochlorothiazide (DYAZIDE) 37.5-25 MG per capsule Take 1 capsule by mouth daily.       . vitamin E 400 UNIT capsule Take 400 Units by mouth daily.      . [DISCONTINUED] oxybutynin (DITROPAN) 5 MG tablet TAKE  (1)  TABLET TWICE A DAY.  60 tablet  0  . [DISCONTINUED] pantoprazole (PROTONIX) 40 MG tablet TAKE 1 TABLET ONCE A DAY  30 tablet  2  . [DISCONTINUED] raloxifene (EVISTA) 60 MG tablet TAKE 1 TABLET ONCE A DAY  30 tablet  2   No facility-administered encounter medications on file as of 09/20/2012.       Review of Systems  Constitutional: Negative for fever, activity change, fatigue and unexpected weight change.  Eyes: Negative.   Respiratory: Positive for cough, shortness of breath and wheezing (occasional). Negative for chest tightness.   Cardiovascular: Negative for chest pain, palpitations and leg swelling.  Gastrointestinal: Negative.  Endocrine: Negative.   Genitourinary: Negative.   Musculoskeletal: Negative.   Neurological: Negative.   Hematological: Negative.   Psychiatric/Behavioral: Negative.        Objective:   Physical Exam  Constitutional: She is oriented to person, place, and time. She appears well-developed and well-nourished.  HENT:  Nose: Nose normal.  Mouth/Throat: Oropharynx is clear and moist.  multiple dental caries  Eyes: EOM are normal.  Neck: Trachea normal, normal range of motion and full passive range of motion without pain. Neck supple. No JVD present. Carotid bruit is not present. No thyromegaly present.  Cardiovascular: Normal rate, regular rhythm, normal heart sounds and intact distal pulses.  Exam reveals no gallop and no friction rub.   No murmur heard. Pulmonary/Chest: Effort normal. No respiratory distress. She has wheezes (faint wheezes).  Abdominal:  Soft. Bowel sounds are normal. She exhibits no distension and no mass. There is tenderness (mild right lower quad tenderness).  Musculoskeletal: Normal range of motion.  Lymphadenopathy:    She has no cervical adenopathy.  Neurological: She is alert and oriented to person, place, and time. She has normal reflexes.  Skin: Skin is warm and dry.  Psychiatric: She has a normal mood and affect. Her behavior is normal. Judgment and thought content normal.     BP 106/59  Pulse 77  Temp(Src) 97.5 F (36.4 C) (Oral)  Ht 5\' 4"  (1.626 m)  Wt 118 lb (53.524 kg)  BMI 20.24 kg/m2      Assessment & Plan:   1. Hypertension   2. Hyperlipidemia LDL goal < 100   3. Osteopenia   4. GERD (gastroesophageal reflux disease)   5. GAD (generalized anxiety disorder)   6. COPD bronchitis   7. Urinary urgency    Orders Placed This Encounter  Procedures  . CMP14+EGFR  . NMR, lipoprofile   Meds ordered this encounter  Medications  . budesonide-formoterol (SYMBICORT) 160-4.5 MCG/ACT inhaler    Sig: Inhale 2 puffs into the lungs 2 (two) times daily.  . rosuvastatin (CRESTOR) 10 MG tablet    Sig: Take 1 tablet (10 mg total) by mouth daily.    Dispense:  30 tablet    Refill:  5    Order Specific Question:  Supervising Provider    Answer:  Ernestina Penna [[id number]]  . raloxifene (EVISTA) 60 MG tablet    Sig: Take 1 tablet (60 mg total) by mouth every other day.    Dispense:  30 tablet    Refill:  5    Order Specific Question:  Supervising Provider    Answer:  Ernestina Penna [[id number]]  . oxybutynin (DITROPAN) 5 MG tablet    Sig: Take 1 tablet (5 mg total) by mouth 2 (two) times daily.    Dispense:  60 tablet    Refill:  5    Order Specific Question:  Supervising Provider    Answer:  Ernestina Penna [[id number]]  . pantoprazole (PROTONIX) 40 MG tablet    Sig: Take 1 tablet (40 mg total) by mouth daily.    Dispense:  30 tablet    Refill:  5    Order Specific Question:  Supervising Provider    Answer:   Ernestina Penna [[id number]]  . albuterol (PROVENTIL HFA;VENTOLIN HFA) 108 (90 BASE) MCG/ACT inhaler    Sig: Inhale 2 puffs into the lungs 2 (two) times daily as needed for wheezing or shortness of breath.    Dispense:  1 Inhaler    Refill:  5  Order Specific Question:  Supervising Provider    Answer:  Ernestina Penna [[id number]]  . triamterene-hydrochlorothiazide (DYAZIDE) 37.5-25 MG per capsule    Sig: Take 1 each (1 capsule total) by mouth daily.    Dispense:  30 capsule    Refill:  5    Order Specific Question:  Supervising Provider    Answer:  Ernestina Penna [[id number]]  . ALPRAZolam (XANAX) 0.5 MG tablet    Sig: Take 1 tablet (0.5 mg total) by mouth 2 (two) times daily.    Dispense:  60 tablet    Refill:  1    Order Specific Question:  Supervising Provider    Answer:  Ernestina Penna [[id number]]   Continue all eds Labs pending Avoid allergens Health Maintenance reviewed  Mary-Margaret Daphine Deutscher, FNP

## 2012-09-20 NOTE — Patient Instructions (Signed)

## 2012-09-22 LAB — SPECIMEN STATUS REPORT

## 2012-09-22 LAB — CMP14+EGFR
ALT: 7 IU/L (ref 0–32)
AST: 16 IU/L (ref 0–40)
Albumin/Globulin Ratio: 2 (ref 1.1–2.5)
Alkaline Phosphatase: 60 IU/L (ref 39–117)
BUN/Creatinine Ratio: 23 (ref 11–26)
Chloride: 96 mmol/L — ABNORMAL LOW (ref 97–108)
GFR calc Af Amer: 101 mL/min/{1.73_m2} (ref >59)
GFR calc non Af Amer: 88 mL/min/{1.73_m2} (ref >59)
Potassium: 5.8 mmol/L — ABNORMAL HIGH (ref 3.5–5.2)
Sodium: 142 mmol/L (ref 134–144)
Total Bilirubin: 0.3 mg/dL (ref 0.0–1.2)
Total Protein: 7.1 g/dL (ref 6.0–8.5)

## 2012-09-22 LAB — NMR, LIPOPROFILE
LDL Size: 20.9 nm (ref >20.5)
LP-IR Score: 25 (ref <=45)
Small LDL Particle Number: 464 nmol/L (ref <=527)
Triglycerides by NMR: 102 mg/dL (ref <150)

## 2012-10-11 DIAGNOSIS — I70209 Unspecified atherosclerosis of native arteries of extremities, unspecified extremity: Secondary | ICD-10-CM | POA: Diagnosis not present

## 2012-10-11 DIAGNOSIS — L851 Acquired keratosis [keratoderma] palmaris et plantaris: Secondary | ICD-10-CM | POA: Diagnosis not present

## 2012-10-11 DIAGNOSIS — B351 Tinea unguium: Secondary | ICD-10-CM | POA: Diagnosis not present

## 2012-11-07 DIAGNOSIS — Z23 Encounter for immunization: Secondary | ICD-10-CM | POA: Diagnosis not present

## 2012-11-19 ENCOUNTER — Other Ambulatory Visit: Payer: Self-pay | Admitting: Nurse Practitioner

## 2012-11-20 NOTE — Telephone Encounter (Signed)
rx called into pharmacy

## 2012-11-20 NOTE — Telephone Encounter (Signed)
Last seen 09/20/12  MMM   If approved route to nurse to phone into [facility]

## 2012-11-20 NOTE — Telephone Encounter (Signed)
Please call in xanax rx with 1 refill 

## 2012-11-29 ENCOUNTER — Ambulatory Visit: Payer: Medicare Other | Admitting: Vascular Surgery

## 2012-12-20 DIAGNOSIS — I70209 Unspecified atherosclerosis of native arteries of extremities, unspecified extremity: Secondary | ICD-10-CM | POA: Diagnosis not present

## 2012-12-20 DIAGNOSIS — B351 Tinea unguium: Secondary | ICD-10-CM | POA: Diagnosis not present

## 2012-12-20 DIAGNOSIS — L851 Acquired keratosis [keratoderma] palmaris et plantaris: Secondary | ICD-10-CM | POA: Diagnosis not present

## 2013-01-15 ENCOUNTER — Other Ambulatory Visit: Payer: Self-pay | Admitting: Nurse Practitioner

## 2013-01-17 NOTE — Telephone Encounter (Signed)
Called in.

## 2013-01-17 NOTE — Telephone Encounter (Signed)
NTBS- please call in xanax rx

## 2013-01-17 NOTE — Telephone Encounter (Signed)
Last seen 09/20/12  MMM  If approved route to nurse to call into [place]

## 2013-02-11 ENCOUNTER — Other Ambulatory Visit: Payer: Self-pay | Admitting: Nurse Practitioner

## 2013-02-12 NOTE — Telephone Encounter (Signed)
Last seen 09/20/12  MMM  If approved route to nurse to call into [facility]

## 2013-02-12 NOTE — Telephone Encounter (Signed)
Please refiill xanax

## 2013-02-14 ENCOUNTER — Encounter (HOSPITAL_COMMUNITY): Payer: Medicare Other

## 2013-02-14 ENCOUNTER — Ambulatory Visit: Payer: Medicare Other | Admitting: Vascular Surgery

## 2013-02-18 NOTE — Telephone Encounter (Signed)
Called into [facility]

## 2013-02-20 ENCOUNTER — Encounter: Payer: Self-pay | Admitting: Vascular Surgery

## 2013-02-21 ENCOUNTER — Ambulatory Visit (INDEPENDENT_AMBULATORY_CARE_PROVIDER_SITE_OTHER)
Admission: RE | Admit: 2013-02-21 | Discharge: 2013-02-21 | Disposition: A | Discharge status: Home / Self Care | Payer: Medicare Other | Source: Ambulatory Visit

## 2013-02-21 ENCOUNTER — Other Ambulatory Visit: Payer: Self-pay | Admitting: Vascular Surgery

## 2013-02-21 ENCOUNTER — Encounter: Payer: Self-pay | Admitting: Vascular Surgery

## 2013-02-21 ENCOUNTER — Ambulatory Visit (HOSPITAL_COMMUNITY)
Admission: RE | Admit: 2013-02-21 | Discharge: 2013-02-21 | Disposition: A | Discharge status: Home / Self Care | Payer: Medicare Other | Source: Ambulatory Visit | Attending: Vascular Surgery | Admitting: Vascular Surgery

## 2013-02-21 ENCOUNTER — Ambulatory Visit (INDEPENDENT_AMBULATORY_CARE_PROVIDER_SITE_OTHER): Payer: Medicare Other | Admitting: Vascular Surgery

## 2013-02-21 VITALS — BP 101/50 | HR 70 | Ht 64.0 in | Wt 109.0 lb

## 2013-02-21 DIAGNOSIS — F17200 Nicotine dependence, unspecified, uncomplicated: Secondary | ICD-10-CM | POA: Diagnosis not present

## 2013-02-21 DIAGNOSIS — E785 Hyperlipidemia, unspecified: Secondary | ICD-10-CM | POA: Insufficient documentation

## 2013-02-21 DIAGNOSIS — Z48812 Encounter for surgical aftercare following surgery on the circulatory system: Secondary | ICD-10-CM

## 2013-02-21 DIAGNOSIS — I739 Peripheral vascular disease, unspecified: Secondary | ICD-10-CM | POA: Diagnosis not present

## 2013-02-21 DIAGNOSIS — M79609 Pain in unspecified limb: Secondary | ICD-10-CM | POA: Insufficient documentation

## 2013-02-21 DIAGNOSIS — Z9889 Other specified postprocedural states: Secondary | ICD-10-CM | POA: Insufficient documentation

## 2013-02-21 NOTE — Progress Notes (Signed)
[facility] Progress Note  02/21/2013 12:08 PM   Subjective: Patient is here for six month f/u from bilateral external iliac stenting (08/2012). She complains of left foot numbness that also bothers her intermittently at night. She denies any calf pain bilaterally with exercise because her shortness of breath usually interferes with exercise first. She is able to walk half a block before she is short of breath. She denies any rest pain at night.    Filed Vitals:   02/21/13 1133  BP: 101/50  Pulse: 70    Physical Exam: Pulses: palpable femoral pulses bilaterally. Palpable left popliteal pulse and palpable left DP/PT pulses. Right popliteal, DP and PT pulses non-palpable. Extremities: Feet slightly cool to touch. No ulcerations.   CBC    Component Value Date/Time   HGB 14.3 08/10/2012 0717   HCT 42.0 08/10/2012 0717    BMET    Component Value Date/Time   NA 142 09/20/2012 1043   NA 138 08/10/2012 0717   K 5.8* 09/20/2012 1043   CL 96* 09/20/2012 1043   CO2 27 09/20/2012 1043   GLUCOSE 86 09/20/2012 1043   GLUCOSE 89 08/10/2012 0717   BUN 14 09/20/2012 1043   BUN 16 08/10/2012 0717   CREATININE 0.60 09/20/2012 1043   CALCIUM 10.1 09/20/2012 1043   GFRNONAA 88 09/20/2012 1043   GFRAA 101 09/20/2012 1043    INR No results found for this basename: inr    ABIs 02/21/13: Right- 0.65, Left -0.89   Assessment:  80 y.o. female is s/p bilateral external iliac stenting 08/2012  Plan: -Symptoms much improved from bilateral iliac stenting in 08/2012. Pt is not having any claudication symptoms due to pulmonary issues.  -Discussed further intervention with SFA stenting or  observation.  -Patient decided to not seek treatment at this time.  -Follow-up in 6 months for ABIs and duplex studies -Encouraged smoking cessation.    Virgina Jock, Student PA [facility] [phone] 02/21/2013 12:08 PM   History exam details as above. Greater than 3 minutes  they spent regarding smoking cessation counseling. The patient is currently happy with her current walking distance. Her ABIs are reasonable enough that she is not at risk of limb loss. She'll followup in 6 months time.  Ruta Hinds, MD [facility] [place] Office: [phone] Pager: [phone]

## 2013-02-22 NOTE — Addendum Note (Signed)
Addended by: Dorthula Rue L on: 02/22/2013 04:02 PM   Modules accepted: Orders

## 2013-03-13 ENCOUNTER — Other Ambulatory Visit: Payer: Self-pay | Admitting: Nurse Practitioner

## 2013-03-14 ENCOUNTER — Other Ambulatory Visit: Payer: Self-pay | Admitting: Family Medicine

## 2013-03-14 DIAGNOSIS — Z1231 Encounter for screening mammogram for malignant neoplasm of breast: Secondary | ICD-10-CM

## 2013-03-14 NOTE — Telephone Encounter (Signed)
Please call in xanax with 0 refills Patient NTBS for follow up and lab work

## 2013-03-14 NOTE — Telephone Encounter (Signed)
Called in.

## 2013-03-14 NOTE — Telephone Encounter (Signed)
Last seen 09/20/12  MMM If approved route to nurse to call into [facility]

## 2013-03-21 DIAGNOSIS — I70209 Unspecified atherosclerosis of native arteries of extremities, unspecified extremity: Secondary | ICD-10-CM | POA: Diagnosis not present

## 2013-03-21 DIAGNOSIS — B351 Tinea unguium: Secondary | ICD-10-CM | POA: Diagnosis not present

## 2013-03-21 DIAGNOSIS — L851 Acquired keratosis [keratoderma] palmaris et plantaris: Secondary | ICD-10-CM | POA: Diagnosis not present

## 2013-03-27 ENCOUNTER — Ambulatory Visit (HOSPITAL_COMMUNITY)
Admission: RE | Admit: 2013-03-27 | Discharge: 2013-03-27 | Disposition: A | Discharge status: Home / Self Care | Payer: Medicare Other | Source: Ambulatory Visit | Attending: Family Medicine | Admitting: Family Medicine

## 2013-03-27 DIAGNOSIS — Z1231 Encounter for screening mammogram for malignant neoplasm of breast: Secondary | ICD-10-CM | POA: Diagnosis not present

## 2013-04-17 ENCOUNTER — Other Ambulatory Visit: Payer: Self-pay | Admitting: Nurse Practitioner

## 2013-04-18 NOTE — Telephone Encounter (Signed)
Patient last seen in office on 09-20-12. Rx for xanax last filled on 03-14-13. Please advise. If approved please route to Pool B so nurse can phone in to pharmacy

## 2013-04-18 NOTE — Telephone Encounter (Signed)
Patient NTBS for follow up and lab work Please call in xanax with 1 refills

## 2013-04-19 NOTE — Telephone Encounter (Signed)
Called in.

## 2013-05-03 ENCOUNTER — Telehealth: Payer: Self-pay | Admitting: Family Medicine

## 2013-05-03 DIAGNOSIS — I10 Essential (primary) hypertension: Secondary | ICD-10-CM

## 2013-05-03 DIAGNOSIS — E785 Hyperlipidemia, unspecified: Secondary | ICD-10-CM

## 2013-05-03 DIAGNOSIS — K219 Gastro-esophageal reflux disease without esophagitis: Secondary | ICD-10-CM

## 2013-05-03 MED ORDER — PANTOPRAZOLE SODIUM 40 MG PO TBEC: 40.0000 mg | DELAYED_RELEASE_TABLET | Freq: Every day | ORAL | Status: DC

## 2013-05-03 MED ORDER — TRIAMTERENE-HCTZ 37.5-25 MG PO CAPS: 1.0000 | ORAL_CAPSULE | Freq: Every day | ORAL | Status: DC

## 2013-05-03 MED ORDER — OXYBUTYNIN CHLORIDE 5 MG PO TABS: 5.0000 mg | ORAL_TABLET | Freq: Two times a day (BID) | ORAL | Status: DC

## 2013-05-03 MED ORDER — ROSUVASTATIN CALCIUM 10 MG PO TABS: 10.0000 mg | ORAL_TABLET | Freq: Every day | ORAL | Status: DC

## 2013-05-03 NOTE — Telephone Encounter (Signed)
Needs to be seen for med refills. Appt scheduled for first available which is 06/10/13. One month given until she can be seen.

## 2013-05-13 ENCOUNTER — Other Ambulatory Visit: Payer: Self-pay | Admitting: Nurse Practitioner

## 2013-05-14 NOTE — Telephone Encounter (Signed)
Last office visit 09-20-12. Please advise

## 2013-05-15 NOTE — Telephone Encounter (Signed)
Patient NTBS for follow up and lab work  

## 2013-05-16 ENCOUNTER — Ambulatory Visit: Payer: Medicare Other | Admitting: Vascular Surgery

## 2013-05-30 DIAGNOSIS — L851 Acquired keratosis [keratoderma] palmaris et plantaris: Secondary | ICD-10-CM | POA: Diagnosis not present

## 2013-05-30 DIAGNOSIS — I70209 Unspecified atherosclerosis of native arteries of extremities, unspecified extremity: Secondary | ICD-10-CM | POA: Diagnosis not present

## 2013-05-30 DIAGNOSIS — B351 Tinea unguium: Secondary | ICD-10-CM | POA: Diagnosis not present

## 2013-06-04 DIAGNOSIS — IMO0002 Reserved for concepts with insufficient information to code with codable children: Secondary | ICD-10-CM | POA: Diagnosis not present

## 2013-06-04 DIAGNOSIS — R609 Edema, unspecified: Secondary | ICD-10-CM | POA: Diagnosis not present

## 2013-06-10 ENCOUNTER — Telehealth: Payer: Self-pay | Admitting: Family Medicine

## 2013-06-10 ENCOUNTER — Ambulatory Visit: Payer: No Typology Code available for payment source | Admitting: Nurse Practitioner

## 2013-06-10 DIAGNOSIS — I10 Essential (primary) hypertension: Secondary | ICD-10-CM

## 2013-06-10 MED ORDER — TRIAMTERENE-HCTZ 37.5-25 MG PO CAPS: 1.0000 | ORAL_CAPSULE | Freq: Every day | ORAL | Status: DC

## 2013-06-10 MED ORDER — ALPRAZOLAM 0.5 MG PO TABS: ORAL_TABLET | ORAL | Status: DC

## 2013-06-10 NOTE — Telephone Encounter (Signed)
Called in.

## 2013-06-10 NOTE — Telephone Encounter (Signed)
Please call in xanax 0.5mg  1 po BID #60 with 1 refills

## 2013-06-10 NOTE — Telephone Encounter (Signed)
Patient has appt with you on June 16 can we refill meds until then

## 2013-06-13 DIAGNOSIS — B353 Tinea pedis: Secondary | ICD-10-CM | POA: Diagnosis not present

## 2013-06-13 DIAGNOSIS — L03119 Cellulitis of unspecified part of limb: Secondary | ICD-10-CM | POA: Diagnosis not present

## 2013-06-13 DIAGNOSIS — L02619 Cutaneous abscess of unspecified foot: Secondary | ICD-10-CM | POA: Diagnosis not present

## 2013-06-19 ENCOUNTER — Emergency Department (HOSPITAL_COMMUNITY): Payer: Medicare Other

## 2013-06-19 ENCOUNTER — Encounter (HOSPITAL_COMMUNITY): Payer: Self-pay | Admitting: Emergency Medicine

## 2013-06-19 ENCOUNTER — Ambulatory Visit: Payer: No Typology Code available for payment source | Admitting: Family Medicine

## 2013-06-19 ENCOUNTER — Inpatient Hospital Stay (HOSPITAL_COMMUNITY)
Admission: EM | Admit: 2013-06-19 | Discharge: 2013-06-25 | DRG: 190 | Disposition: A | Discharge status: Home / Self Care | Payer: Medicare Other | Attending: Internal Medicine | Admitting: Internal Medicine

## 2013-06-19 DIAGNOSIS — Z885 Allergy status to narcotic agent status: Secondary | ICD-10-CM

## 2013-06-19 DIAGNOSIS — W010XXA Fall on same level from slipping, tripping and stumbling without subsequent striking against object, initial encounter: Secondary | ICD-10-CM | POA: Diagnosis present

## 2013-06-19 DIAGNOSIS — D869 Sarcoidosis, unspecified: Secondary | ICD-10-CM | POA: Diagnosis present

## 2013-06-19 DIAGNOSIS — R0602 Shortness of breath: Secondary | ICD-10-CM | POA: Diagnosis not present

## 2013-06-19 DIAGNOSIS — K5289 Other specified noninfective gastroenteritis and colitis: Secondary | ICD-10-CM | POA: Diagnosis present

## 2013-06-19 DIAGNOSIS — Z880 Allergy status to penicillin: Secondary | ICD-10-CM

## 2013-06-19 DIAGNOSIS — J479 Bronchiectasis, uncomplicated: Secondary | ICD-10-CM | POA: Diagnosis not present

## 2013-06-19 DIAGNOSIS — R0609 Other forms of dyspnea: Secondary | ICD-10-CM

## 2013-06-19 DIAGNOSIS — W19XXXA Unspecified fall, initial encounter: Secondary | ICD-10-CM

## 2013-06-19 DIAGNOSIS — Z681 Body mass index (BMI) 19 or less, adult: Secondary | ICD-10-CM | POA: Diagnosis not present

## 2013-06-19 DIAGNOSIS — F411 Generalized anxiety disorder: Secondary | ICD-10-CM | POA: Diagnosis present

## 2013-06-19 DIAGNOSIS — F3289 Other specified depressive episodes: Secondary | ICD-10-CM | POA: Diagnosis present

## 2013-06-19 DIAGNOSIS — Z801 Family history of malignant neoplasm of trachea, bronchus and lung: Secondary | ICD-10-CM

## 2013-06-19 DIAGNOSIS — J8410 Pulmonary fibrosis, unspecified: Secondary | ICD-10-CM | POA: Diagnosis present

## 2013-06-19 DIAGNOSIS — K219 Gastro-esophageal reflux disease without esophagitis: Secondary | ICD-10-CM

## 2013-06-19 DIAGNOSIS — Z9849 Cataract extraction status, unspecified eye: Secondary | ICD-10-CM

## 2013-06-19 DIAGNOSIS — E43 Unspecified severe protein-calorie malnutrition: Secondary | ICD-10-CM | POA: Diagnosis not present

## 2013-06-19 DIAGNOSIS — D72829 Elevated white blood cell count, unspecified: Secondary | ICD-10-CM | POA: Diagnosis present

## 2013-06-19 DIAGNOSIS — I10 Essential (primary) hypertension: Secondary | ICD-10-CM | POA: Diagnosis present

## 2013-06-19 DIAGNOSIS — R599 Enlarged lymph nodes, unspecified: Secondary | ICD-10-CM | POA: Diagnosis not present

## 2013-06-19 DIAGNOSIS — J9600 Acute respiratory failure, unspecified whether with hypoxia or hypercapnia: Secondary | ICD-10-CM | POA: Diagnosis present

## 2013-06-19 DIAGNOSIS — I739 Peripheral vascular disease, unspecified: Secondary | ICD-10-CM

## 2013-06-19 DIAGNOSIS — Y92009 Unspecified place in unspecified non-institutional (private) residence as the place of occurrence of the external cause: Secondary | ICD-10-CM

## 2013-06-19 DIAGNOSIS — J441 Chronic obstructive pulmonary disease with (acute) exacerbation: Secondary | ICD-10-CM | POA: Diagnosis not present

## 2013-06-19 DIAGNOSIS — Z9181 History of falling: Secondary | ICD-10-CM | POA: Diagnosis not present

## 2013-06-19 DIAGNOSIS — R0600 Dyspnea, unspecified: Secondary | ICD-10-CM

## 2013-06-19 DIAGNOSIS — Z882 Allergy status to sulfonamides status: Secondary | ICD-10-CM

## 2013-06-19 DIAGNOSIS — K589 Irritable bowel syndrome without diarrhea: Secondary | ICD-10-CM

## 2013-06-19 DIAGNOSIS — R000 Tachycardia, unspecified: Secondary | ICD-10-CM

## 2013-06-19 DIAGNOSIS — R636 Underweight: Secondary | ICD-10-CM | POA: Diagnosis present

## 2013-06-19 DIAGNOSIS — K52832 Lymphocytic colitis: Secondary | ICD-10-CM

## 2013-06-19 DIAGNOSIS — R6889 Other general symptoms and signs: Secondary | ICD-10-CM | POA: Diagnosis not present

## 2013-06-19 DIAGNOSIS — R609 Edema, unspecified: Secondary | ICD-10-CM

## 2013-06-19 DIAGNOSIS — Z8249 Family history of ischemic heart disease and other diseases of the circulatory system: Secondary | ICD-10-CM | POA: Diagnosis not present

## 2013-06-19 DIAGNOSIS — R590 Localized enlarged lymph nodes: Secondary | ICD-10-CM

## 2013-06-19 DIAGNOSIS — Z860100 Personal history of colon polyps, unspecified: Secondary | ICD-10-CM

## 2013-06-19 DIAGNOSIS — Z86711 Personal history of pulmonary embolism: Secondary | ICD-10-CM

## 2013-06-19 DIAGNOSIS — R0989 Other specified symptoms and signs involving the circulatory and respiratory systems: Secondary | ICD-10-CM

## 2013-06-19 DIAGNOSIS — G911 Obstructive hydrocephalus: Secondary | ICD-10-CM | POA: Diagnosis present

## 2013-06-19 DIAGNOSIS — R634 Abnormal weight loss: Secondary | ICD-10-CM | POA: Diagnosis present

## 2013-06-19 DIAGNOSIS — F329 Major depressive disorder, single episode, unspecified: Secondary | ICD-10-CM | POA: Diagnosis present

## 2013-06-19 DIAGNOSIS — F17200 Nicotine dependence, unspecified, uncomplicated: Secondary | ICD-10-CM | POA: Diagnosis present

## 2013-06-19 DIAGNOSIS — E785 Hyperlipidemia, unspecified: Secondary | ICD-10-CM | POA: Diagnosis present

## 2013-06-19 DIAGNOSIS — Z7982 Long term (current) use of aspirin: Secondary | ICD-10-CM

## 2013-06-19 DIAGNOSIS — M79609 Pain in unspecified limb: Secondary | ICD-10-CM

## 2013-06-19 DIAGNOSIS — J9601 Acute respiratory failure with hypoxia: Secondary | ICD-10-CM

## 2013-06-19 DIAGNOSIS — S0993XA Unspecified injury of face, initial encounter: Secondary | ICD-10-CM | POA: Diagnosis not present

## 2013-06-19 LAB — CBC WITH DIFFERENTIAL/PLATELET
Basophils Absolute: 0 10*3/uL (ref 0.0–0.1)
Basophils Relative: 0 % (ref 0–1)
EOS ABS: 1 10*3/uL — AB (ref 0.0–0.7)
Eosinophils Relative: 4 % (ref 0–5)
HCT: 39.3 % (ref 36.0–46.0)
HEMOGLOBIN: 13.5 g/dL (ref 12.0–15.0)
LYMPHS ABS: 1.6 10*3/uL (ref 0.7–4.0)
LYMPHS PCT: 6 % — AB (ref 12–46)
MCH: 26.8 pg (ref 26.0–34.0)
MCHC: 34.4 g/dL (ref 30.0–36.0)
MCV: 78 fL (ref 78.0–100.0)
Monocytes Absolute: 1.2 10*3/uL — ABNORMAL HIGH (ref 0.1–1.0)
Monocytes Relative: 5 % (ref 3–12)
NEUTROS ABS: 21 10*3/uL — AB (ref 1.7–7.7)
NEUTROS PCT: 85 % — AB (ref 43–77)
PLATELETS: 243 10*3/uL (ref 150–400)
RBC: 5.04 MIL/uL (ref 3.87–5.11)
RDW: 15.6 % — ABNORMAL HIGH (ref 11.5–15.5)
WBC: 24.8 10*3/uL — AB (ref 4.0–10.5)

## 2013-06-19 LAB — BASIC METABOLIC PANEL
BUN: 26 mg/dL — AB (ref 6–23)
CO2: 26 mEq/L (ref 19–32)
Calcium: 10.6 mg/dL — ABNORMAL HIGH (ref 8.4–10.5)
Chloride: 94 mEq/L — ABNORMAL LOW (ref 96–112)
Creatinine, Ser: 0.7 mg/dL (ref 0.50–1.10)
GFR calc Af Amer: >90 mL/min (ref >90)
GFR calc non Af Amer: 80 mL/min — ABNORMAL LOW (ref >90)
GLUCOSE: 83 mg/dL (ref 70–99)
Potassium: 4.5 mEq/L (ref 3.7–5.3)
Sodium: 136 mEq/L — ABNORMAL LOW (ref 137–147)

## 2013-06-19 LAB — CBC
HEMATOCRIT: 41 % (ref 36.0–46.0)
Hemoglobin: 14.2 g/dL (ref 12.0–15.0)
MCH: 26.9 pg (ref 26.0–34.0)
MCHC: 34.6 g/dL (ref 30.0–36.0)
MCV: 77.8 fL — ABNORMAL LOW (ref 78.0–100.0)
Platelets: 228 10*3/uL (ref 150–400)
RBC: 5.27 MIL/uL — ABNORMAL HIGH (ref 3.87–5.11)
RDW: 15.5 % (ref 11.5–15.5)
WBC: 23.6 10*3/uL — AB (ref 4.0–10.5)

## 2013-06-19 LAB — I-STAT TROPONIN, ED: Troponin i, poc: 0.02 ng/mL (ref 0.00–0.08)

## 2013-06-19 LAB — I-STAT CG4 LACTIC ACID, ED: LACTIC ACID, VENOUS: 1.17 mmol/L (ref 0.5–2.2)

## 2013-06-19 LAB — PRO B NATRIURETIC PEPTIDE: Pro B Natriuretic peptide (BNP): 441.5 pg/mL (ref 0–450)

## 2013-06-19 LAB — TSH: TSH: 0.85 u[IU]/mL (ref 0.350–4.500)

## 2013-06-19 MED ORDER — VITAMIN D 50 MCG (2000 UT) PO TABS: 2000.0000 [IU] | ORAL_TABLET | Freq: Every day | ORAL | Status: DC

## 2013-06-19 MED ORDER — ALPRAZOLAM 0.5 MG PO TABS
0.5000 mg | ORAL_TABLET | Freq: Two times a day (BID) | ORAL | Status: DC
  Administered 2013-06-20 – 2013-06-25 (×10): 0.5 mg via ORAL
  Filled 2013-06-19 (×10): qty 1

## 2013-06-19 MED ORDER — SODIUM CHLORIDE 0.9 % IV SOLN
INTRAVENOUS | Status: DC
  Administered 2013-06-19 – 2013-06-22 (×2): via INTRAVENOUS

## 2013-06-19 MED ORDER — HEPARIN SODIUM (PORCINE) 5000 UNIT/ML IJ SOLN
5000.0000 [IU] | Freq: Three times a day (TID) | INTRAMUSCULAR | Status: DC
  Administered 2013-06-19 – 2013-06-25 (×17): 5000 [IU] via SUBCUTANEOUS
  Filled 2013-06-19 (×20): qty 1

## 2013-06-19 MED ORDER — TRIAMTERENE-HCTZ 37.5-25 MG PO CAPS
1.0000 | ORAL_CAPSULE | Freq: Every day | ORAL | Status: DC
Start: 2013-06-19 — End: 2013-06-22
  Administered 2013-06-20: 1 via ORAL
  Filled 2013-06-19 (×4): qty 1

## 2013-06-19 MED ORDER — SODIUM CHLORIDE 0.9 % IJ SOLN
3.0000 mL | Freq: Two times a day (BID) | INTRAMUSCULAR | Status: DC
  Administered 2013-06-19 – 2013-06-25 (×9): 3 mL via INTRAVENOUS

## 2013-06-19 MED ORDER — RALOXIFENE HCL 60 MG PO TABS
60.0000 mg | ORAL_TABLET | ORAL | Status: DC
  Administered 2013-06-20 – 2013-06-24 (×3): 60 mg via ORAL
  Filled 2013-06-19 (×3): qty 1

## 2013-06-19 MED ORDER — METHYLPREDNISOLONE SODIUM SUCC 125 MG IJ SOLR
125.0000 mg | Freq: Two times a day (BID) | INTRAMUSCULAR | Status: DC
  Administered 2013-06-20: 125 mg via INTRAVENOUS
  Filled 2013-06-19 (×3): qty 2

## 2013-06-19 MED ORDER — COLESTIPOL HCL 1 G PO TABS: 2.0000 g | ORAL_TABLET | ORAL | Status: DC

## 2013-06-19 MED ORDER — METHYLPREDNISOLONE SODIUM SUCC 125 MG IJ SOLR
125.0000 mg | Freq: Once | INTRAMUSCULAR | Status: AC
  Administered 2013-06-19: 125 mg via INTRAVENOUS
  Filled 2013-06-19: qty 2

## 2013-06-19 MED ORDER — IPRATROPIUM-ALBUTEROL 0.5-2.5 (3) MG/3ML IN SOLN: 3.0000 mL | RESPIRATORY_TRACT | Status: DC | PRN

## 2013-06-19 MED ORDER — IPRATROPIUM-ALBUTEROL 0.5-2.5 (3) MG/3ML IN SOLN
3.0000 mL | RESPIRATORY_TRACT | Status: DC
  Administered 2013-06-20: 3 mL via RESPIRATORY_TRACT
  Filled 2013-06-19: qty 3

## 2013-06-19 MED ORDER — BUDESONIDE-FORMOTEROL FUMARATE 160-4.5 MCG/ACT IN AERO
2.0000 | INHALATION_SPRAY | Freq: Two times a day (BID) | RESPIRATORY_TRACT | Status: DC
  Administered 2013-06-20 – 2013-06-24 (×9): 2 via RESPIRATORY_TRACT
  Filled 2013-06-19 (×2): qty 6

## 2013-06-19 MED ORDER — OXYBUTYNIN CHLORIDE 5 MG PO TABS
5.0000 mg | ORAL_TABLET | Freq: Two times a day (BID) | ORAL | Status: DC
  Administered 2013-06-20 – 2013-06-25 (×10): 5 mg via ORAL
  Filled 2013-06-19 (×13): qty 1

## 2013-06-19 MED ORDER — LEVOFLOXACIN IN D5W 750 MG/150ML IV SOLN
750.0000 mg | INTRAVENOUS | Status: DC
  Administered 2013-06-19 – 2013-06-21 (×3): 750 mg via INTRAVENOUS
  Filled 2013-06-19 (×4): qty 150

## 2013-06-19 MED ORDER — ATORVASTATIN CALCIUM 20 MG PO TABS
20.0000 mg | ORAL_TABLET | Freq: Every day | ORAL | Status: DC
  Filled 2013-06-19 (×2): qty 1

## 2013-06-19 MED ORDER — VITAMIN D3 25 MCG (1000 UNIT) PO TABS
2000.0000 [IU] | ORAL_TABLET | Freq: Every day | ORAL | Status: DC
  Administered 2013-06-20 – 2013-06-25 (×6): 2000 [IU] via ORAL
  Filled 2013-06-19 (×6): qty 2

## 2013-06-19 MED ORDER — ASPIRIN EC 325 MG PO TBEC
325.0000 mg | DELAYED_RELEASE_TABLET | Freq: Every day | ORAL | Status: DC
  Administered 2013-06-20 – 2013-06-25 (×6): 325 mg via ORAL
  Filled 2013-06-19 (×8): qty 1

## 2013-06-19 MED ORDER — PANTOPRAZOLE SODIUM 40 MG PO TBEC
40.0000 mg | DELAYED_RELEASE_TABLET | Freq: Every day | ORAL | Status: DC
  Administered 2013-06-20 – 2013-06-25 (×6): 40 mg via ORAL
  Filled 2013-06-19 (×7): qty 1

## 2013-06-19 MED ORDER — COLESTIPOL HCL 1 G PO TABS
2.0000 g | ORAL_TABLET | Freq: Two times a day (BID) | ORAL | Status: DC
  Administered 2013-06-20 – 2013-06-25 (×11): 2 g via ORAL
  Filled 2013-06-19 (×14): qty 2

## 2013-06-19 NOTE — ED Notes (Signed)
Pt given ice chips

## 2013-06-19 NOTE — H&P (Addendum)
Hospitalist Admission History and Physical  Patient name: [name] Medical record number: [id number] Date of birth: Dec 01, 1932 Age: 80 y.o. Gender: female  Primary Care Provider: Redge Gainer, MD  Chief Complaint: Acute respiratory failure with hypoxia  History of Present Illness:This is a 80 y.o. year old female with notable history of COPD-not on O2, hyperlipidemia, lymphocytic colitis, remote history of PE, colonic adenoma presenting with acute respiratory failure with hypoxia. Patient says she's had generalized weakness over the past week she states she has slipped/fell out of her recliner 2-3 times this week. Denies any true syncopal episodes with this. Denies any head trauma. Feels like this may have been related to weakness. States she's had worsening productive cough and mild dyspnea for the past 2-3 days. Denies any true fevers or chills. Has had some questionable mild orthopnea. Trace swelling in the lower extremities. Denies any recent infections. Has been wheezing at home. Presented to the ER today with symptoms. Afebrile on presentation. Heart rate in the 110s. Systolic pressures in the 100s. Initially satting in the mid to upper 80s on room air. Now satting in the mid 90s on 4 L nasal cannula. White blood cell count 23.6, hemoglobin 14.2, creatinine 0.7, BUN 26, calcium 10.6. Lactate and trop WNL. Pro BNP of 441. Maxillofacial CT performed in the ER because of facial pain with no reported fracture. Chest CT shows extensive interstitial lung disease primarily in the upper lobes with progressive bronchiectasis in bilateral bases as well as new hilar and mediastinal adenopathy? Of sarcoidosis.   Patient Active Problem List   Diagnosis Date Noted  . Acute respiratory failure with hypoxia 06/19/2013  . Peripheral vascular disease, unspecified 05/17/2012  . Pain in limb 05/17/2012  . Edema 02/16/2012  . IBS (irritable bowel syndrome) 02/09/2012  . Lymphocytic colitis 12/15/2011   . Personal history of adenomatous colonic polyps 12/02/2011  . GERD 10/20/2008   Past Medical History: Past Medical History  Diagnosis Date  . Anxiety disorder   . GERD (gastroesophageal reflux disease)   . COPD (chronic obstructive pulmonary disease)   . Depression   . Hyperlipidemia   . Peptic ulcer disease   . Pneumonia   . Pulmonary embolism   . Colon adenoma   . Pseudotumor cerebri   . Irritable bowel syndrome   . Lymphocytic colitis 12/15/2011  . Fall at home May 17, 2012    Pt. fell in tub today    Past Surgical History: Past Surgical History  Procedure Laterality Date  . Cholecystectomy    . Colon resection    . Abdominal hysterectomy    . Brain surgery      10 YEARS AGO   . Cataract extraction      BOTH EYES  . Upper gastrointestinal endoscopy    . Colonoscopy      Social History: History   Social History  . Marital Status: Widowed    Spouse Name: N/A    Number of Children: N/A  . Years of Education: N/A   Occupational History  . retired    Social History Main Topics  . Smoking status: Current Every Day Smoker -- 0.50 packs/day    Types: Cigarettes  . Smokeless tobacco: Never Used     Comment: pt states "I will wait until all of this is over"  . Alcohol Use: No  . Drug Use: No  . Sexual Activity: None   Other Topics Concern  . None   Social History Narrative  . None  Family History: Family History  Problem Relation Age of Onset  . Lung cancer Father   . Colon cancer Neg Hx   . Cancer Mother   . Hyperlipidemia Mother   . Hypertension Mother   . Heart attack Mother   . Hyperlipidemia Sister     Allergies: Allergies  Allergen Reactions  . Codeine Anaphylaxis  . Penicillins Anaphylaxis  . Sulfa Antibiotics Anaphylaxis    Current Facility-Administered Medications  Medication Dose Route Frequency Provider Last Rate Last Dose  . 0.9 %  sodium chloride infusion   Intravenous Continuous Shanda Howells, MD      . ALPRAZolam  Duanne Moron) tablet 0.5 mg  0.5 mg Oral BID Shanda Howells, MD      . aspirin EC tablet 325 mg  325 mg Oral Daily Shanda Howells, MD      . Derrill Memo ON 06/20/2013] atorvastatin (LIPITOR) tablet 20 mg  20 mg Oral q1800 Shanda Howells, MD      . budesonide-formoterol [facility]) 160-4.5 MCG/ACT inhaler 2 puff  2 puff Inhalation BID Shanda Howells, MD      . colestipol (COLESTID) tablet 2 g  2 g Oral See admin instructions Shanda Howells, MD      . heparin injection 5,000 Units  5,000 Units Subcutaneous 3 times per day Shanda Howells, MD      . levofloxacin (LEVAQUIN) IVPB 750 mg  750 mg Intravenous Q24H Shanda Howells, MD      . methylPREDNISolone sodium succinate (SOLU-MEDROL) 125 mg/2 mL injection 125 mg  125 mg Intravenous Q12H Shanda Howells, MD      . oxybutynin (DITROPAN) tablet 5 mg  5 mg Oral BID Shanda Howells, MD      . pantoprazole (PROTONIX) EC tablet 40 mg  40 mg Oral Daily Shanda Howells, MD      . raloxifene (EVISTA) tablet 60 mg  60 mg Oral Jodean Lima, MD      . sodium chloride 0.9 % injection 3 mL  3 mL Intravenous Q12H Shanda Howells, MD      . triamterene-hydrochlorothiazide (DYAZIDE) 37.5-25 MG per capsule 1 capsule  1 capsule Oral Daily Shanda Howells, MD      . Vitamin D 2,000 Units  2,000 Units Oral Daily Shanda Howells, MD       Current Outpatient Prescriptions  Medication Sig Dispense Refill  . albuterol (PROVENTIL HFA;VENTOLIN HFA) 108 (90 BASE) MCG/ACT inhaler Inhale 2 puffs into the lungs 2 (two) times daily as needed for wheezing or shortness of breath.  1 Inhaler  5  . ALPRAZolam (XANAX) 0.5 MG tablet Take 0.5 mg by mouth 2 (two) times daily.      Marland Kitchen aspirin EC 325 MG tablet Take 325 mg by mouth daily.      . budesonide-formoterol (SYMBICORT) 160-4.5 MCG/ACT inhaler Inhale 2 puffs into the lungs 2 (two) times daily.      . Cholecalciferol (VITAMIN D) 2000 UNITS tablet Take 2,000 Units by mouth daily.      . colestipol (COLESTID) 1 G tablet Take 2 g by mouth See admin  instructions. Takes once or twice a day depending on bowel movements      . CRANBERRY PO Take 1 tablet by mouth daily.      . Guaifenesin (MUCINEX MAXIMUM STRENGTH) 1200 MG TB12 Take 1,200 mg by mouth every morning.      Marland Kitchen guaiFENesin (MUCINEX) 600 MG 12 hr tablet Take 600 mg by mouth every evening.       . minocycline (MINOCIN,DYNACIN)  100 MG capsule Take 100 mg by mouth 2 (two) times daily.      Marland Kitchen oxybutynin (DITROPAN) 5 MG tablet Take 1 tablet (5 mg total) by mouth 2 (two) times daily.  60 tablet  0  . pantoprazole (PROTONIX) 40 MG tablet Take 1 tablet (40 mg total) by mouth daily.  30 tablet  0  . Polyethyl Glycol-Propyl Glycol (SYSTANE OP) Place 1 drop into both eyes daily.      . raloxifene (EVISTA) 60 MG tablet Take 1 tablet (60 mg total) by mouth every other day.  30 tablet  5  . rosuvastatin (CRESTOR) 10 MG tablet Take 1 tablet (10 mg total) by mouth daily.  30 tablet  0  . vitamin E 400 UNIT capsule Take 400 Units by mouth daily.      Marland Kitchen triamterene-hydrochlorothiazide (DYAZIDE) 37.5-25 MG per capsule Take 1 each (1 capsule total) by mouth daily.  30 capsule  0   Review Of Systems: 12 point ROS negative except as noted above in HPI.  Physical Exam: Filed Vitals:   06/19/13 1849  BP: 109/85  Pulse:   Temp:   Resp: 25    General: cooperative and moderate distress HEENT: PERRLA, extra ocular movement intact and dry oral mucosa Heart: S1, S2 normal, no murmur, rub or gallop, regular rate and rhythm Lungs: mildly decreased resp effort, no auscultatable abnormalities  Abdomen: abdomen is soft without significant tenderness, masses, organomegaly or guarding Extremities: trace edema  Skin:no rashes Neurology: normal without focal findings  Labs and Imaging: Lab Results  Component Value Date/Time   NA 136* 06/19/2013  4:07 PM   NA 142 09/20/2012 10:43 AM   K 4.5 06/19/2013  4:07 PM   CL 94* 06/19/2013  4:07 PM   CO2 26 06/19/2013  4:07 PM   BUN 26* 06/19/2013  4:07 PM   BUN 14  09/20/2012 10:43 AM   CREATININE 0.70 06/19/2013  4:07 PM   GLUCOSE 83 06/19/2013  4:07 PM   GLUCOSE 86 09/20/2012 10:43 AM   Lab Results  Component Value Date   WBC 23.6* 06/19/2013   HGB 14.2 06/19/2013   HCT 41.0 06/19/2013   MCV 77.8* 06/19/2013   PLT 228 06/19/2013   Urinalysis No results found for this basename: colorurine, appearanceur, labspec, phurine, glucoseu, hgbur, bilirubinur, ketonesur, proteinur, urobilinogen, nitrite, leukocytesur       Ct Chest Wo Contrast  06/19/2013   CLINICAL DATA:  Shortness of breath. Abnormal radiograph demonstrating extensive infiltrates.  EXAM: CT CHEST WITHOUT CONTRAST  TECHNIQUE: Multidetector CT imaging of the chest was performed following the standard protocol without IV contrast.  COMPARISON:  CT scan dated 11/10/2008  FINDINGS: The patient has developed hilar and mediastinal adenopathy since the prior exam. Subcarinal lymph node measures 2.6 cm AP dimension on image 33. Precarinal lymph nodes measure 11 mm and 15 mm on images 23 and 26 respectively. There is also new adenopathy in the aortopulmonary window.  The patient has developed severe interstitial lung disease superimposed on pre-existing emphysema since the prior 11/10/2008. The patient has also developed bronchiectasis in the left lower lobe with areas of mucous plugging and atelectasis at the left base. There is also progressive bronchiectasis at the right lung base.  Heart size is normal. No effusions. No acute osseous abnormality. Images through the upper abdomen demonstrate the 3 cm hepatic cyst in the anterior aspect of the dome of the left lobe of the liver, increased from 1.8 cm on 11/10/2008.  IMPRESSION: 1.  New extensive interstitial lung disease primarily in the upper lobes with progressive bronchiectasis at the right base and new extensive bronchiectasis at the left base. 2. New hilar and mediastinal adenopathy. 3. The possibility of sarcoidosis should be considered. The patient has had  previous resection of of a portion of the posterior aspect of the left seventh rib. Has the patient had a prior lung biopsy?   Electronically Signed   By: Rozetta Nunnery M.D.   On: 06/19/2013 17:46   Dg Chest Port 1 View  06/19/2013   CLINICAL DATA:  Shortness of breath  EXAM: PORTABLE CHEST - 1 VIEW  COMPARISON:  12/16/2005  FINDINGS: Cardiac shadow is within normal limits. Diffuse interstitial and alveolar changes are noted throughout both lungs. This may all be chronic in nature although this is a significant interval change when compared with the prior exam of 8 years previous. Acute infiltrate cannot be totally excluded. Slight increase consolidation is noted in the left retrocardiac region. CT may be helpful for further evaluation.  IMPRESSION: Diffuse changes through both lungs which may be related to chronic fibrosis although the possibility of superimposed infection cannot be totally excluded. CT may be helpful for further evaluation.   Electronically Signed   By: Inez Catalina M.D.   On: 06/19/2013 16:21   Ct Maxillofacial Wo Cm  06/19/2013   CLINICAL DATA:  Fall.  Right facial injury.  EXAM: CT MAXILLOFACIAL WITHOUT CONTRAST  TECHNIQUE: Multidetector CT imaging of the maxillofacial structures was performed. Multiplanar CT image reconstructions were also generated. A small metallic BB was placed on the right temple in order to reliably differentiate right from left.  COMPARISON:  CT head 11/18/2004  FINDINGS: Atrophy and ventriculomegaly is unchanged.  Negative for facial fracture. No fracture of the orbit or mandible. No air-fluid level in the paranasal sinuses. Mild mucosal thickening in the paranasal sinuses. Cervical spondylosis.  IMPRESSION: Negative for facial fracture.   Electronically Signed   By: Franchot Gallo M.D.   On: 06/19/2013 17:37      Assessment and Plan: SHAVONNE AMBROISE is a 80 y.o. year old female presenting with acute resp failure with hypoxia   Acute respiratory failure  with hypoxia: Likely multifactorial contributions of COPD exacerbation as well as? Interstitial lung disease. We'll place on IV Solu-Medrol as well as IV Levaquin. Supplemental oxygen. Also check 2-D echo. Given bronchiectasis on CT in the setting of acute respiratory failure with hypoxia, will consult pulmonology for further evaluation (asked ER physician to do this). Panculture. Start workup with sputum cx, ESR, ANA, RF.   Leukocytosis: Markedly elevated white blood cell count at presentation. Differential on white blood cell count is pending. Add on  peripheral smear. Panculture. We'll continue to trend, though IV steroids may falsely elevate.  Hypertension: Soft blood pressure. Hold antihypertensives.   FEN/GI: N.p.o. for now. Advance diet as tolerated. PPI. Prophylaxis: Subcutaneous heparin Disposition: Pending further evaluation Code Status: Full code       Shanda Howells MD  Pager: [phone]

## 2013-06-19 NOTE — ED Notes (Signed)
Presents with SOB began yesterday, reports she was unable to get around at tall. Stopped taking lasix a week ago due to making her urinate too often. Labored respirations, 88% on RA, placed on 4 L o2, sats 95%/ HX of COPD/Emphysema. SOB worse with exertion. Bilateral lower extremity edema, fine crackles. Denies pain

## 2013-06-19 NOTE — ED Provider Notes (Signed)
CSN: [id number]     Arrival date & time 06/19/13  1533 History   First MD Initiated Contact with Patient 06/19/13 1542     Chief Complaint  Patient presents with  . Shortness of Breath     (Consider location/radiation/quality/duration/timing/severity/associated sxs/prior Treatment) HPI Patient presents with progressive dyspnea, fatigue. Symptoms began yesterday.  Since onset symptoms of worsening, with diminished exercise capacity, persistent dyspnea.  The patient denies focal pain, but states that she is sore all over. Notably, the patient was found on the ground yesterday by a neighbor after approximately 3 hours following a fall. The patient recalls bending over to remove an object, falling to the ground, with no loss of consciousness, no subsequent confusion, dislocation, visual changes, neck pain. The patient was on the ground for 3 hours. Patient states that she was on the ground because of profound weakness everywhere. Patient adds that she stopped taking Lasix approximately 5 days ago.  The patient was taking Lasix do to fluid retention, possibly 2 to hydrocephalus. Patient has a history of a shunt in the past, this has been removed secondary to infection.   Past Medical History  Diagnosis Date  . Anxiety disorder   . GERD (gastroesophageal reflux disease)   . COPD (chronic obstructive pulmonary disease)   . Depression   . Hyperlipidemia   . Peptic ulcer disease   . Pneumonia   . Pulmonary embolism   . Colon adenoma   . Pseudotumor cerebri   . Irritable bowel syndrome   . Lymphocytic colitis 12/15/2011  . Fall at home May 17, 2012    Pt. fell in tub today   Past Surgical History  Procedure Laterality Date  . Cholecystectomy    . Colon resection    . Abdominal hysterectomy    . Brain surgery      10 YEARS AGO   . Cataract extraction      BOTH EYES  . Upper gastrointestinal endoscopy    . Colonoscopy     Family History  Problem Relation Age of Onset  . Lung  cancer Father   . Colon cancer Neg Hx   . Cancer Mother   . Hyperlipidemia Mother   . Hypertension Mother   . Heart attack Mother   . Hyperlipidemia Sister    History  Substance Use Topics  . Smoking status: Current Every Day Smoker -- 0.50 packs/day    Types: Cigarettes  . Smokeless tobacco: Never Used     Comment: pt states "I will wait until all of this is over"  . Alcohol Use: No   OB History   Grav Para Term Preterm Abortions TAB SAB Ect Mult Living                 Review of Systems  Constitutional:       Per HPI, otherwise negative  HENT:       Per HPI, otherwise negative  Respiratory:       Per HPI, otherwise negative  Cardiovascular:       Per HPI, otherwise negative  Gastrointestinal: Negative for vomiting.  Endocrine:       Negative aside from HPI  Genitourinary:       Neg aside from HPI   Musculoskeletal:       Per HPI, otherwise negative  Skin: Negative.   Neurological: Negative for syncope.      Allergies  Codeine; Penicillins; and Sulfa antibiotics  Home Medications   Prior to Admission medications   Medication  Sig Start Date End Date Taking? Authorizing Provider  albuterol (PROVENTIL HFA;VENTOLIN HFA) 108 (90 BASE) MCG/ACT inhaler Inhale 2 puffs into the lungs 2 (two) times daily as needed for wheezing or shortness of breath. 09/20/12   Mary-Margaret Hassell Done, FNP  ALPRAZolam Duanne Moron) 0.5 MG tablet TAKE  (1)  TABLET TWICE A DAY. 06/10/13   Mary-Margaret Hassell Done, FNP  aspirin EC 325 MG tablet Take 325 mg by mouth daily.    Historical Provider, MD  budesonide-formoterol (SYMBICORT) 160-4.5 MCG/ACT inhaler Inhale 2 puffs into the lungs 2 (two) times daily.    Historical Provider, MD  Cholecalciferol (VITAMIN D) 2000 UNITS tablet Take 2,000 Units by mouth daily.    Historical Provider, MD  colestipol (COLESTID) 1 G tablet Take 2 g by mouth See admin instructions. Takes once or twice a day depending on bowel movements    Historical Provider, MD  CRANBERRY PO  Take 1 tablet by mouth daily.    Historical Provider, MD  Guaifenesin (MUCINEX MAXIMUM STRENGTH) 1200 MG TB12 Take 1,200 mg by mouth every morning.    Historical Provider, MD  guaiFENesin (MUCINEX) 600 MG 12 hr tablet Take 600 mg by mouth every evening.     Historical Provider, MD  oxybutynin (DITROPAN) 5 MG tablet Take 1 tablet (5 mg total) by mouth 2 (two) times daily. 05/03/13   Mary-Margaret Hassell Done, FNP  pantoprazole (PROTONIX) 40 MG tablet Take 1 tablet (40 mg total) by mouth daily. 05/03/13   Mary-Margaret Hassell Done, FNP  Polyethyl Glycol-Propyl Glycol (SYSTANE OP) Place 1 drop into both eyes daily.    Historical Provider, MD  raloxifene (EVISTA) 60 MG tablet Take 1 tablet (60 mg total) by mouth every other day. 09/20/12   Mary-Margaret Hassell Done, FNP  rosuvastatin (CRESTOR) 10 MG tablet Take 1 tablet (10 mg total) by mouth daily. 05/03/13   Mary-Margaret Hassell Done, FNP  triamterene-hydrochlorothiazide (DYAZIDE) 37.5-25 MG per capsule TAKE (1) CAPSULE DAILY    Mary-Margaret Hassell Done, FNP  triamterene-hydrochlorothiazide (DYAZIDE) 37.5-25 MG per capsule Take 1 each (1 capsule total) by mouth daily. 06/10/13   Mary-Margaret Hassell Done, FNP  vitamin E 400 UNIT capsule Take 400 Units by mouth daily.    Historical Provider, MD   BP 112/59  Pulse 116  Temp(Src) 97.7 F (36.5 C) (Oral)  Resp 22  SpO2 95% Physical Exam  Nursing note and vitals reviewed. Constitutional: She is oriented to person, place, and time. She appears well-developed and well-nourished. She appears cachectic. She appears ill.  HENT:  Head: Normocephalic and atraumatic.  Eyes: Conjunctivae and EOM are normal.  Cardiovascular: Regular rhythm and intact distal pulses.  Tachycardia present.   Pulmonary/Chest: No stridor. Tachypnea noted. No respiratory distress. She has decreased breath sounds. She has wheezes.  Abdominal: She exhibits no distension.  Musculoskeletal: She exhibits edema. She exhibits no tenderness.  Neurological: She is alert  and oriented to person, place, and time. No cranial nerve deficit.  Skin: Skin is warm and dry.  Psychiatric: She has a normal mood and affect.    ED Course  Procedures (including critical care time) Labs Review Labs Reviewed  CBC - Abnormal; Notable for the following:    WBC 23.6 (*)    RBC 5.27 (*)    MCV 77.8 (*)    All other components within normal limits  BASIC METABOLIC PANEL - Abnormal; Notable for the following:    Sodium 136 (*)    Chloride 94 (*)    BUN 26 (*)    Calcium 10.6 (*)  GFR calc non Af Amer 80 (*)    All other components within normal limits  PRO B NATRIURETIC PEPTIDE  I-STAT TROPOININ, ED  I-STAT CG4 LACTIC ACID, ED    Imaging Review Ct Chest Wo Contrast  06/19/2013   CLINICAL DATA:  Shortness of breath. Abnormal radiograph demonstrating extensive infiltrates.  EXAM: CT CHEST WITHOUT CONTRAST  TECHNIQUE: Multidetector CT imaging of the chest was performed following the standard protocol without IV contrast.  COMPARISON:  CT scan dated 11/10/2008  FINDINGS: The patient has developed hilar and mediastinal adenopathy since the prior exam. Subcarinal lymph node measures 2.6 cm AP dimension on image 33. Precarinal lymph nodes measure 11 mm and 15 mm on images 23 and 26 respectively. There is also new adenopathy in the aortopulmonary window.  The patient has developed severe interstitial lung disease superimposed on pre-existing emphysema since the prior 11/10/2008. The patient has also developed bronchiectasis in the left lower lobe with areas of mucous plugging and atelectasis at the left base. There is also progressive bronchiectasis at the right lung base.  Heart size is normal. No effusions. No acute osseous abnormality. Images through the upper abdomen demonstrate the 3 cm hepatic cyst in the anterior aspect of the dome of the left lobe of the liver, increased from 1.8 cm on 11/10/2008.  IMPRESSION: 1. New extensive interstitial lung disease primarily in the upper  lobes with progressive bronchiectasis at the right base and new extensive bronchiectasis at the left base. 2. New hilar and mediastinal adenopathy. 3. The possibility of sarcoidosis should be considered. The patient has had previous resection of of a portion of the posterior aspect of the left seventh rib. Has the patient had a prior lung biopsy?   Electronically Signed   By: Rozetta Nunnery M.D.   On: 06/19/2013 17:46   Dg Chest Port 1 View  06/19/2013   CLINICAL DATA:  Shortness of breath  EXAM: PORTABLE CHEST - 1 VIEW  COMPARISON:  12/16/2005  FINDINGS: Cardiac shadow is within normal limits. Diffuse interstitial and alveolar changes are noted throughout both lungs. This may all be chronic in nature although this is a significant interval change when compared with the prior exam of 8 years previous. Acute infiltrate cannot be totally excluded. Slight increase consolidation is noted in the left retrocardiac region. CT may be helpful for further evaluation.  IMPRESSION: Diffuse changes through both lungs which may be related to chronic fibrosis although the possibility of superimposed infection cannot be totally excluded. CT may be helpful for further evaluation.   Electronically Signed   By: Inez Catalina M.D.   On: 06/19/2013 16:21   Ct Maxillofacial Wo Cm  06/19/2013   CLINICAL DATA:  Fall.  Right facial injury.  EXAM: CT MAXILLOFACIAL WITHOUT CONTRAST  TECHNIQUE: Multidetector CT imaging of the maxillofacial structures was performed. Multiplanar CT image reconstructions were also generated. A small metallic BB was placed on the right temple in order to reliably differentiate right from left.  COMPARISON:  CT head 11/18/2004  FINDINGS: Atrophy and ventriculomegaly is unchanged.  Negative for facial fracture. No fracture of the orbit or mandible. No air-fluid level in the paranasal sinuses. Mild mucosal thickening in the paranasal sinuses. Cervical spondylosis.  IMPRESSION: Negative for facial fracture.    Electronically Signed   By: Franchot Gallo M.D.   On: 06/19/2013 17:37     EKG is sinus tachycardia, rate 111, artifacts, T-wave abnormalities, abnormal Cardiac monitor shows a rate in the 110 range, abnormal Pulse  oximetry is 97% 4 L nasal cannula, abnormal   After the initial evaluation I reviewed the patient's chart.  Update: On exam the patient continues to look dyspneic, uncomfortable. I had a lengthy conversation with her about all results.  On exam the patient remains in similar condition.   MDM   Patient presents with dyspnea, fatigue.  The patient does have a history of COPD, but after initial x-ray was abnormal, CT scan was performed, and demonstrated interstitial fibrosis, possible sarcoidosis per Patient is afebrile, but has leukocytosis, tachycardia, and with no interstitial lung disease, she required admission for further evaluation and management.  Steroids initiated in the emergency department.  Patient had no hypoxia, that corrected with 4 L nasal cannula.    Carmin Muskrat, MD 06/19/13 [phone]

## 2013-06-19 NOTE — ED Notes (Signed)
Called report to [place], [place].

## 2013-06-19 NOTE — ED Notes (Signed)
Patient transported to CT 

## 2013-06-19 NOTE — ED Notes (Signed)
Dr Vanita Panda at bedside to update pt

## 2013-06-20 ENCOUNTER — Encounter (HOSPITAL_COMMUNITY): Payer: Self-pay

## 2013-06-20 DIAGNOSIS — R599 Enlarged lymph nodes, unspecified: Secondary | ICD-10-CM

## 2013-06-20 DIAGNOSIS — J9600 Acute respiratory failure, unspecified whether with hypoxia or hypercapnia: Secondary | ICD-10-CM | POA: Diagnosis not present

## 2013-06-20 DIAGNOSIS — J441 Chronic obstructive pulmonary disease with (acute) exacerbation: Secondary | ICD-10-CM | POA: Diagnosis not present

## 2013-06-20 DIAGNOSIS — R609 Edema, unspecified: Secondary | ICD-10-CM

## 2013-06-20 DIAGNOSIS — R0609 Other forms of dyspnea: Secondary | ICD-10-CM | POA: Diagnosis not present

## 2013-06-20 DIAGNOSIS — K219 Gastro-esophageal reflux disease without esophagitis: Secondary | ICD-10-CM

## 2013-06-20 DIAGNOSIS — J479 Bronchiectasis, uncomplicated: Secondary | ICD-10-CM | POA: Diagnosis not present

## 2013-06-20 LAB — CBC WITH DIFFERENTIAL/PLATELET
Basophils Absolute: 0 10*3/uL (ref 0.0–0.1)
Basophils Relative: 0 % (ref 0–1)
Eosinophils Absolute: 0.1 10*3/uL (ref 0.0–0.7)
Eosinophils Relative: 1 % (ref 0–5)
HEMATOCRIT: 35.5 % — AB (ref 36.0–46.0)
HEMOGLOBIN: 11.9 g/dL — AB (ref 12.0–15.0)
LYMPHS ABS: 0.9 10*3/uL (ref 0.7–4.0)
LYMPHS PCT: 6 % — AB (ref 12–46)
MCH: 26.4 pg (ref 26.0–34.0)
MCHC: 33.5 g/dL (ref 30.0–36.0)
MCV: 78.7 fL (ref 78.0–100.0)
MONOS PCT: 2 % — AB (ref 3–12)
Monocytes Absolute: 0.3 10*3/uL (ref 0.1–1.0)
NEUTROS ABS: 14.1 10*3/uL — AB (ref 1.7–7.7)
Neutrophils Relative %: 91 % — ABNORMAL HIGH (ref 43–77)
Platelets: 228 10*3/uL (ref 150–400)
RBC: 4.51 MIL/uL (ref 3.87–5.11)
RDW: 15.8 % — ABNORMAL HIGH (ref 11.5–15.5)
WBC: 15.5 10*3/uL — AB (ref 4.0–10.5)

## 2013-06-20 LAB — MRSA PCR SCREENING: MRSA BY PCR: NEGATIVE

## 2013-06-20 LAB — EXPECTORATED SPUTUM ASSESSMENT W REFEX TO RESP CULTURE

## 2013-06-20 LAB — COMPREHENSIVE METABOLIC PANEL
ALBUMIN: 2.3 g/dL — AB (ref 3.5–5.2)
ALT: 18 U/L (ref 0–35)
AST: 19 U/L (ref 0–37)
Alkaline Phosphatase: 98 U/L (ref 39–117)
BUN: 25 mg/dL — AB (ref 6–23)
CO2: 22 mEq/L (ref 19–32)
Calcium: 10 mg/dL (ref 8.4–10.5)
Chloride: 94 mEq/L — ABNORMAL LOW (ref 96–112)
Creatinine, Ser: 0.62 mg/dL (ref 0.50–1.10)
GFR calc non Af Amer: 84 mL/min — ABNORMAL LOW (ref >90)
Glucose, Bld: 107 mg/dL — ABNORMAL HIGH (ref 70–99)
Potassium: 3.9 mEq/L (ref 3.7–5.3)
SODIUM: 136 meq/L — AB (ref 137–147)
TOTAL PROTEIN: 5.6 g/dL — AB (ref 6.0–8.3)
Total Bilirubin: 0.4 mg/dL (ref 0.3–1.2)

## 2013-06-20 LAB — RHEUMATOID FACTOR: Rhuematoid fact SerPl-aCnc: 23 IU/mL — ABNORMAL HIGH (ref <=14)

## 2013-06-20 LAB — ANGIOTENSIN CONVERTING ENZYME: ANGIOTENSIN-CONVERTING ENZYME: 48 U/L (ref 8–52)

## 2013-06-20 LAB — SEDIMENTATION RATE: SED RATE: 30 mm/h — AB (ref 0–22)

## 2013-06-20 MED ORDER — ENSURE COMPLETE PO LIQD
237.0000 mL | Freq: Two times a day (BID) | ORAL | Status: DC
  Administered 2013-06-20 – 2013-06-21 (×3): 237 mL via ORAL

## 2013-06-20 MED ORDER — BIOTENE DRY MOUTH MT LIQD
15.0000 mL | Freq: Two times a day (BID) | OROMUCOSAL | Status: DC
  Administered 2013-06-20 – 2013-06-25 (×11): 15 mL via OROMUCOSAL

## 2013-06-20 MED ORDER — METHYLPREDNISOLONE SODIUM SUCC 40 MG IJ SOLR
40.0000 mg | Freq: Two times a day (BID) | INTRAMUSCULAR | Status: DC
  Administered 2013-06-20 – 2013-06-21 (×3): 40 mg via INTRAVENOUS
  Filled 2013-06-20 (×4): qty 1

## 2013-06-20 MED ORDER — PNEUMOCOCCAL VAC POLYVALENT 25 MCG/0.5ML IJ INJ
0.5000 mL | INJECTION | INTRAMUSCULAR | Status: AC
  Administered 2013-06-21: 0.5 mL via INTRAMUSCULAR
  Filled 2013-06-20: qty 0.5

## 2013-06-20 MED ORDER — METHYLPREDNISOLONE SODIUM SUCC 125 MG IJ SOLR
60.0000 mg | Freq: Four times a day (QID) | INTRAMUSCULAR | Status: DC
  Filled 2013-06-20 (×4): qty 0.96

## 2013-06-20 MED ORDER — IPRATROPIUM-ALBUTEROL 0.5-2.5 (3) MG/3ML IN SOLN: 3.0000 mL | RESPIRATORY_TRACT | Status: DC | PRN

## 2013-06-20 MED ORDER — IPRATROPIUM-ALBUTEROL 0.5-2.5 (3) MG/3ML IN SOLN
3.0000 mL | RESPIRATORY_TRACT | Status: DC
  Administered 2013-06-20 (×5): 3 mL via RESPIRATORY_TRACT
  Filled 2013-06-20 (×5): qty 3

## 2013-06-20 MED ORDER — IPRATROPIUM-ALBUTEROL 0.5-2.5 (3) MG/3ML IN SOLN
3.0000 mL | Freq: Four times a day (QID) | RESPIRATORY_TRACT | Status: DC
  Administered 2013-06-21 – 2013-06-22 (×7): 3 mL via RESPIRATORY_TRACT
  Filled 2013-06-20 (×7): qty 3

## 2013-06-20 NOTE — Progress Notes (Addendum)
INITIAL NUTRITION ASSESSMENT  DOCUMENTATION CODES Per approved criteria  -Severe malnutrition in the context of chronic illness -Underweight   INTERVENTION:  Ensure Complete PO BID, each supplement provides 350 kcal and 13 grams of protein  Recommend liberalize diet to regular, MD please order.  NUTRITION DIAGNOSIS: Malnutrition related to inadequate oral intake with increased energy requirement as evidenced by severe depletion of subcutaneous fat and muscle mass.   Goal: Intake to meet >90% of estimated nutrition needs.  Monitor:  PO intake, labs, weight trend.  Reason for Assessment: MST  80 y.o. female  Admitting Dx: Acute respiratory failure with hypoxia  ASSESSMENT: 80 y.o. year old female with notable history of COPD-not on O2, hyperlipidemia, lymphocytic colitis, remote history of PE, colonic adenoma presenting with acute respiratory failure with hypoxia.   Patient reports recent weight loss over the past year. Per discussion with RN, patient's neighbor reports that patient does not eat because she doesn't want to have a bowel movement. Patient with visible severe loss of muscle mass. She agreed to try Ensure Complete to maximize oral intake.  Nutrition Focused Physical Exam:  Subcutaneous Fat:  Orbital Region: severe depletion Upper Arm Region: WNL Thoracic and Lumbar Region: severe depletion  Muscle:  Temple Region: moderate depletion Clavicle Bone Region: severe depletion Clavicle and Acromion Bone Region: severe depletion Scapular Bone Region: severe depletion Dorsal Hand: severe depletion Patellar Region: mild depletion Anterior Thigh Region: mild depletion Posterior Calf Region: mild depletion  Edema: none  Pt meets criteria for severe MALNUTRITION in the context of chronic illness as evidenced by severe depletion of subcutaneous fat and muscle mass.   Height: Ht Readings from Last 1 Encounters:  06/19/13 5\' 5"  (1.651 m)    Weight: Wt  Readings from Last 1 Encounters:  06/20/13 109 lb 9.1 oz (49.7 kg)    Ideal Body Weight: 56.8 kg  % Ideal Body Weight: 88%  Wt Readings from Last 10 Encounters:  06/20/13 109 lb 9.1 oz (49.7 kg)  02/21/13 109 lb (49.442 kg)  09/20/12 118 lb (53.524 kg)  08/10/12 110 lb (49.896 kg)  08/10/12 110 lb (49.896 kg)  08/02/12 120 lb (54.432 kg)  05/17/12 126 lb (57.153 kg)  03/09/12 130 lb (58.968 kg)  02/16/12 131 lb 12.8 oz (59.784 kg)  02/09/12 129 lb 4 oz (58.627 kg)    Usual Body Weight: 126 lb one year ago  % Usual Body Weight: 87%  BMI:  Body mass index is 18.23 kg/(m^2). Underweight  Estimated Nutritional Needs: Kcal: 1500 Protein: 70-80 gm Fluid: 1.5 L  Skin: stage 2 to coccyx  Diet Order: Cardiac  EDUCATION NEEDS: -Education needs addressed   Intake/Output Summary (Last 24 hours) at 06/20/13 1020 Last data filed at 06/20/13 0800  Gross per 24 hour  Intake    600 ml  Output      0 ml  Net    600 ml    Last BM: None documented  Labs:   Recent Labs Lab 06/19/13 1607 06/20/13 0323  NA 136* 136*  K 4.5 3.9  CL 94* 94*  CO2 26 22  BUN 26* 25*  CREATININE 0.70 0.62  CALCIUM 10.6* 10.0  GLUCOSE 83 107*    CBG (last 3)  No results found for this basename: GLUCAP,  in the last 72 hours  Scheduled Meds: . ALPRAZolam  0.5 mg Oral BID  . antiseptic oral rinse  15 mL Mouth Rinse BID  . aspirin EC  325 mg Oral Daily  .  atorvastatin  20 mg Oral q1800  . budesonide-formoterol  2 puff Inhalation BID  . cholecalciferol  2,000 Units Oral Daily  . colestipol  2 g Oral BID  . heparin  5,000 Units Subcutaneous 3 times per day  . ipratropium-albuterol  3 mL Nebulization Q4H  . levofloxacin (LEVAQUIN) IV  750 mg Intravenous Q24H  . methylPREDNISolone (SOLU-MEDROL) injection  60 mg Intravenous Q6H  . oxybutynin  5 mg Oral BID  . pantoprazole  40 mg Oral Daily  . [START ON 06/21/2013] pneumococcal 23 valent vaccine  0.5 mL Intramuscular Tomorrow-1000  .  raloxifene  60 mg Oral QODAY  . sodium chloride  3 mL Intravenous Q12H  . triamterene-hydrochlorothiazide  1 capsule Oral Daily    Continuous Infusions: . sodium chloride 50 mL/hr at 06/20/13 0800    Past Medical History  Diagnosis Date  . Anxiety disorder   . GERD (gastroesophageal reflux disease)   . COPD (chronic obstructive pulmonary disease)   . Depression   . Hyperlipidemia   . Peptic ulcer disease   . Pneumonia   . Pulmonary embolism   . Colon adenoma   . Pseudotumor cerebri   . Irritable bowel syndrome   . Lymphocytic colitis 12/15/2011  . Fall at home May 17, 2012    Pt. fell in tub today    Past Surgical History  Procedure Laterality Date  . Cholecystectomy    . Colon resection    . Abdominal hysterectomy    . Brain surgery      10 YEARS AGO   . Cataract extraction      BOTH EYES  . Upper gastrointestinal endoscopy    . Colonoscopy      Molli Barrows, RD, LDN, [place] Pager [phone] After Hours Pager [phone]

## 2013-06-20 NOTE — Progress Notes (Addendum)
**Note De-Identified  Obfuscation** RT note: flutter valve used to mobilize and clear secretions for AFB.  Small amount collected and sent to lab.

## 2013-06-20 NOTE — Consult Note (Addendum)
Name: [name] MRN: [id number] DOB: 24-Jun-1932    ADMISSION DATE:  06/19/2013 CONSULTATION DATE:  6/11  PCP: Dr.  Laurance Flatten at [place] MD :  [place]:  [facility]  CHIEF COMPLAINT/ reason for consult: bronchiectasis   BRIEF PATIENT DESCRIPTION:  80 year old female w/ reported h/o COPD, HL, remote PE, colonic adenoma and lymphocytic colitis, admitted on 6/10 w/ cc: 1 week h/o progressive weakness, c/b falls at home, worsening cough but unable to produce sputum. Then progressive SOB over the 3 days prior to presentation w/ increased wheezing. On presentation in the ER her sats were in 80s on room air, wbc CT 23.6. CT of chest showed: New extensive interstitial lung disease primarily in the upper lobes with progressive bronchiectasis at the right base and new extensive bronchiectasis at the left base. And New hilar and mediastinal adenopathy- compared to CT from 2010.   SIGNIFICANT EVENTS: 6/10 - Admit with falls, SOB  STUDIES:  6/10 - new extensive ILD, upper lobe predominant, new hilar & mediastinal adenopathy, previous rib resection  LINES / TUBES:  CULTURES: MRSA PCXR 6/10: neg   ANTIBIOTICS: Levaquin 6/10 >>  HISTORY OF PRESENT ILLNESS:    80 year old female, smoker, w/ reported h/o COPD, HL, remote PE, colonic adenoma and lymphocytic colitis, admitted on 6/10 w/ cc: 1 week h/o progressive weakness, c/b falls at home, worsening cough but unable to produce sputum. Then progressive SOB over the 3 days prior to presentation w/ increased wheezing. She denied subjective fevers / chills.  On presentation in the ER her sats were in 80s on room air, wbc CT 23.6. A CXR was obtained showing diffuse interstitial changes and recommended a CT of chest, which was obtained and showed: New extensive interstitial lung disease primarily in the upper lobes with progressive bronchiectasis at the right base and new extensive bronchiectasis at the left  base. And New hilar and mediastinal adenopathy. PCCM was asked to evaluate for these CT of chest changes.   Patient reports frequent falls at home.  Most recently, she went to pick up something from the floor and fell striking her head.  At baseline, she does not leave the home very often.  She reports 10-15 lb weight loss over last 6 months.    PAST MEDICAL HISTORY :  Past Medical History  Diagnosis Date  . Anxiety disorder   . GERD (gastroesophageal reflux disease)   . COPD (chronic obstructive pulmonary disease)   . Depression   . Hyperlipidemia   . Peptic ulcer disease   . Pneumonia   . Pulmonary embolism   . Colon adenoma   . Pseudotumor cerebri   . Irritable bowel syndrome   . Lymphocytic colitis 12/15/2011  . Fall at home May 17, 2012    Pt. fell in tub today   Past Surgical History  Procedure Laterality Date  . Cholecystectomy    . Colon resection    . Abdominal hysterectomy    . Brain surgery      10 YEARS AGO   . Cataract extraction      BOTH EYES  . Upper gastrointestinal endoscopy    . Colonoscopy     Prior to Admission medications   Medication Sig Start Date End Date Taking? Authorizing Provider  albuterol (PROVENTIL HFA;VENTOLIN HFA) 108 (90 BASE) MCG/ACT inhaler Inhale 2 puffs into the lungs 2 (two) times daily as needed for wheezing or shortness of breath. 09/20/12  Yes Mary-Margaret  Hassell Done, FNP  ALPRAZolam Duanne Moron) 0.5 MG tablet Take 0.5 mg by mouth 2 (two) times daily.   Yes Historical Provider, MD  aspirin EC 325 MG tablet Take 325 mg by mouth daily.   Yes Historical Provider, MD  budesonide-formoterol (SYMBICORT) 160-4.5 MCG/ACT inhaler Inhale 2 puffs into the lungs 2 (two) times daily.   Yes Historical Provider, MD  Cholecalciferol (VITAMIN D) 2000 UNITS tablet Take 2,000 Units by mouth daily.   Yes Historical Provider, MD  colestipol (COLESTID) 1 G tablet Take 2 g by mouth See admin instructions. Takes once or twice a day depending on bowel movements   Yes  Historical Provider, MD  CRANBERRY PO Take 1 tablet by mouth daily.   Yes Historical Provider, MD  Guaifenesin (MUCINEX MAXIMUM STRENGTH) 1200 MG TB12 Take 1,200 mg by mouth every morning.   Yes Historical Provider, MD  guaiFENesin (MUCINEX) 600 MG 12 hr tablet Take 600 mg by mouth every evening.    Yes Historical Provider, MD  minocycline (MINOCIN,DYNACIN) 100 MG capsule Take 100 mg by mouth 2 (two) times daily.   Yes Historical Provider, MD  oxybutynin (DITROPAN) 5 MG tablet Take 1 tablet (5 mg total) by mouth 2 (two) times daily. 05/03/13  Yes Mary-Margaret Hassell Done, FNP  pantoprazole (PROTONIX) 40 MG tablet Take 1 tablet (40 mg total) by mouth daily. 05/03/13  Yes Mary-Margaret Hassell Done, FNP  Polyethyl Glycol-Propyl Glycol (SYSTANE OP) Place 1 drop into both eyes daily.   Yes Historical Provider, MD  raloxifene (EVISTA) 60 MG tablet Take 1 tablet (60 mg total) by mouth every other day. 09/20/12  Yes Mary-Margaret Hassell Done, FNP  rosuvastatin (CRESTOR) 10 MG tablet Take 1 tablet (10 mg total) by mouth daily. 05/03/13  Yes Mary-Margaret Hassell Done, FNP  vitamin E 400 UNIT capsule Take 400 Units by mouth daily.   Yes Historical Provider, MD  triamterene-hydrochlorothiazide (DYAZIDE) 37.5-25 MG per capsule Take 1 each (1 capsule total) by mouth daily. 06/10/13   Mary-Margaret Hassell Done, FNP   Allergies  Allergen Reactions  . Codeine Anaphylaxis  . Penicillins Anaphylaxis  . Sulfa Antibiotics Anaphylaxis    FAMILY HISTORY:  Family History  Problem Relation Age of Onset  . Lung cancer Father   . Colon cancer Neg Hx   . Cancer Mother   . Hyperlipidemia Mother   . Hypertension Mother   . Heart attack Mother   . Hyperlipidemia Sister    SOCIAL HISTORY:  reports that she has been smoking Cigarettes.  She has been smoking about 0.50 packs per day. She has never used smokeless tobacco. She reports that she does not drink alcohol or use illicit drugs.  REVIEW OF SYSTEMS:   Constitutional: Negative for fever,  chills, weight loss, malaise/fatigue and diaphoresis.  HENT: Negative for hearing loss, ear pain, nosebleeds, congestion, sore throat, neck pain, tinnitus and ear discharge.   Eyes: Negative for blurred vision, double vision, photophobia, pain, discharge and redness.  Respiratory: Negative for hemoptysis, sputum production,  wheezing and stridor.  Pt reports shortness of breath, cough without ability to cough up sputum.   Cardiovascular: Negative for chest pain, palpitations, orthopnea, claudication, leg swelling and PND.  Gastrointestinal: Negative for heartburn, nausea, vomiting, abdominal pain, diarrhea, constipation, blood in stool and melena.  Genitourinary: Negative for dysuria, urgency, frequency, hematuria and flank pain.  Musculoskeletal: Negative for myalgias, back pain, joint pain.  Pt reports falls.  Skin: Negative for itching and rash.  Neurological: Negative for dizziness, tingling, tremors, sensory change, speech change, focal weakness, seizures,  loss of consciousness, weakness and headaches.  Endo/Heme/Allergies: Negative for environmental allergies and polydipsia. Does not bruise/bleed easily.  SUBJECTIVE:   VITAL SIGNS: Temp:  [97.4 F (36.3 C)-98.5 F (36.9 C)] 98.5 F (36.9 C) (06/11 0700) Pulse Rate:  [99-116] 110 (06/11 0700) Resp:  [19-33] 26 (06/11 0700) BP: (92-113)/(48-85) 92/48 mmHg (06/11 0800) SpO2:  [91 %-97 %] 96 % (06/11 0939) Weight:  [49.6 kg (109 lb 5.6 oz)-49.7 kg (109 lb 9.1 oz)] 49.7 kg (109 lb 9.1 oz) (06/11 0331)  PHYSICAL EXAMINATION: General:  Thin adult female in NAD Neuro:  AAOx4, speech clear, MAE HEENT:  Mm pink/moist, no JVD Neck:  No LAN Cardiovascular:  s1s2 rrr, no m/r/g Lungs:  resp's even/non-labored, moist cough, lungs with crackles L base Abdomen:  Flat, soft, non-tender, no HSPM Musculoskeletal:  No acute deformities Skin:  Warm/dry, 1+ pitting edema   Recent Labs Lab 06/19/13 1607 06/20/13 0323  NA 136* 136*  K 4.5 3.9    CL 94* 94*  CO2 26 22  BUN 26* 25*  CREATININE 0.70 0.62  GLUCOSE 83 107*    Recent Labs Lab 06/19/13 1607 06/19/13 1945 06/20/13 0323  HGB 14.2 13.5 11.9*  HCT 41.0 39.3 35.5*  WBC 23.6* 24.8* 15.5*  PLT 228 243 228   Ct Chest Wo Contrast  06/19/2013   CLINICAL DATA:  Shortness of breath. Abnormal radiograph demonstrating extensive infiltrates.  EXAM: CT CHEST WITHOUT CONTRAST  TECHNIQUE: Multidetector CT imaging of the chest was performed following the standard protocol without IV contrast.  COMPARISON:  CT scan dated 11/10/2008  FINDINGS: The patient has developed hilar and mediastinal adenopathy since the prior exam. Subcarinal lymph node measures 2.6 cm AP dimension on image 33. Precarinal lymph nodes measure 11 mm and 15 mm on images 23 and 26 respectively. There is also new adenopathy in the aortopulmonary window.  The patient has developed severe interstitial lung disease superimposed on pre-existing emphysema since the prior 11/10/2008. The patient has also developed bronchiectasis in the left lower lobe with areas of mucous plugging and atelectasis at the left base. There is also progressive bronchiectasis at the right lung base.  Heart size is normal. No effusions. No acute osseous abnormality. Images through the upper abdomen demonstrate the 3 cm hepatic cyst in the anterior aspect of the dome of the left lobe of the liver, increased from 1.8 cm on 11/10/2008.  IMPRESSION: 1. New extensive interstitial lung disease primarily in the upper lobes with progressive bronchiectasis at the right base and new extensive bronchiectasis at the left base. 2. New hilar and mediastinal adenopathy. 3. The possibility of sarcoidosis should be considered. The patient has had previous resection of of a portion of the posterior aspect of the left seventh rib. Has the patient had a prior lung biopsy?   Electronically Signed   By: Rozetta Nunnery M.D.   On: 06/19/2013 17:46   Dg Chest Port 1  View  06/19/2013   CLINICAL DATA:  Shortness of breath  EXAM: PORTABLE CHEST - 1 VIEW  COMPARISON:  12/16/2005  FINDINGS: Cardiac shadow is within normal limits. Diffuse interstitial and alveolar changes are noted throughout both lungs. This may all be chronic in nature although this is a significant interval change when compared with the prior exam of 8 years previous. Acute infiltrate cannot be totally excluded. Slight increase consolidation is noted in the left retrocardiac region. CT may be helpful for further evaluation.  IMPRESSION: Diffuse changes through both lungs which may be  related to chronic fibrosis although the possibility of superimposed infection cannot be totally excluded. CT may be helpful for further evaluation.   Electronically Signed   By: Inez Catalina M.D.   On: 06/19/2013 16:21   Ct Maxillofacial Wo Cm  06/19/2013   CLINICAL DATA:  Fall.  Right facial injury.  EXAM: CT MAXILLOFACIAL WITHOUT CONTRAST  TECHNIQUE: Multidetector CT imaging of the maxillofacial structures was performed. Multiplanar CT image reconstructions were also generated. A small metallic BB was placed on the right temple in order to reliably differentiate right from left.  COMPARISON:  CT head 11/18/2004  FINDINGS: Atrophy and ventriculomegaly is unchanged.  Negative for facial fracture. No fracture of the orbit or mandible. No air-fluid level in the paranasal sinuses. Mild mucosal thickening in the paranasal sinuses. Cervical spondylosis.  IMPRESSION: Negative for facial fracture.   Electronically Signed   By: Franchot Gallo M.D.   On: 06/19/2013 17:37    ASSESSMENT / PLAN:  Bronchiectasis - new changes on CT, r/o MAI, sarcoid could be unifying diagnosis but 80 y.o woman not typical profile Mediastinal lymphadenopathy Emphysema / COPD Hypoxemia  Tobacco Abuse  Plan: -oxygen to keep sats 88-95% -assess for O2 needs prior to d/c, anticipate she will need -smoking cessation -continue current pulmonary  regimen:  Duoneb, symbicort -adjust solu-medrol, can taper to prednisone in am 6/12 since no  bspasm  -Levaquin x 10-14 days  -PPI -assess sputum for MAI (Not TB, does not need isolation precautions) -arrange for pulmonary follow up closer to d/c -pulmonary hygiene: flutter valve, cough etc -Will likely need 65mnth FU CT scan -Check ACE level for completion, can consider bronchoscopy as out pt for etiology (blind transbronchial biopsy has about 70% yield for sarcoid, preferably would need EBus or mediastonoscopy for lymph node biopsy - best to hold off on these considerations for now)  Noe Gens, NP-C [facility] Pgr: [phone] or [phone]   Discussed above receommendations with pt & daughter [name] (who is a nurse)  Kara Mead MD. FCCP. [facility] Pager [phone] If no response call [phone]     06/20/2013, 10:11 AM

## 2013-06-20 NOTE — Progress Notes (Signed)
Patient ID: [name]  female  YOV:[id number]    DOB: [birth year]    DOA: 06/19/2013  PCP: Redge Gainer, MD  Assessment/Plan: Principal Problem:   Acute respiratory failure with hypoxia with COPD exacerbation - Continue IV Solu-Medrol, IV Levaquin, supplemental oxygen, scheduled nebs, Symbicort - CT of the chest showed new extensive interstitial lung disease in the upper lobes, progressive bronchiectasis at the right base and new extensive bronchiectasis at the left base, new hilar and mediastinal adenopathy, sarcoidosis? - Will order ACE level, d/w Dr. Elsworth Soho from pulmonology for recommendations  Active Problems:   GERD - Continue PPI    Peripheral vascular disease, unspecified - Continue aspirin, Lipitor  Hyperlipidemia - Continue Lipitor  DVT Prophylaxis:  Code Status:Full code  Family Communication:Patient alert and oriented x4, discussed in detail with the patient, she has not followed with any pulmonologist  Disposition:  Consultants:  Pulmonology  Procedures:  None  Antibiotics:  *IV levofloxacin 6/10 >>    Subjective: Patient seen and examined, on 3 L O2 via nasal cannula, not on any O2 at home, no chest pain  Objective: Weight change:   Intake/Output Summary (Last 24 hours) at 06/20/13 0900 Last data filed at 06/20/13 0800  Gross per 24 hour  Intake    600 ml  Output      0 ml  Net    600 ml   Blood pressure 92/48, pulse 110, temperature 98.5 F (36.9 C), temperature source Oral, resp. rate 26, height 5\' 5"  (1.651 m), weight 49.7 kg (109 lb 9.1 oz), SpO2 94.00%.  Physical Exam: General: Alert and awake, oriented x3, not in any acute distress. CVS: S1-S2 clear, no murmur rubs or gallops Chest: Bilateral wheezing  Abdomen: soft nontender, nondistended, normal bowel sounds  Extremities: no cyanosis, clubbing or edema noted bilaterally Neuro: Cranial nerves II-XII intact, no focal neurological deficits  Lab Results: Basic Metabolic  Panel:  Recent Labs Lab 06/19/13 1607 06/20/13 0323  NA 136* 136*  K 4.5 3.9  CL 94* 94*  CO2 26 22  GLUCOSE 83 107*  BUN 26* 25*  CREATININE 0.70 0.62  CALCIUM 10.6* 10.0   Liver Function Tests:  Recent Labs Lab 06/20/13 0323  AST 19  ALT 18  ALKPHOS 98  BILITOT 0.4  PROT 5.6*  ALBUMIN 2.3*   No results found for this basename: LIPASE, AMYLASE,  in the last 168 hours No results found for this basename: AMMONIA,  in the last 168 hours CBC:  Recent Labs Lab 06/19/13 1945 06/20/13 0323  WBC 24.8* 15.5*  NEUTROABS 21.0* 14.1*  HGB 13.5 11.9*  HCT 39.3 35.5*  MCV 78.0 78.7  PLT 243 228   Cardiac Enzymes: No results found for this basename: CKTOTAL, CKMB, CKMBINDEX, TROPONINI,  in the last 168 hours BNP: No components found with this basename: POCBNP,  CBG: No results found for this basename: GLUCAP,  in the last 168 hours   Micro Results: Recent Results (from the past 240 hour(s))  MRSA PCR SCREENING     Status: None   Collection Time    06/19/13 11:22 PM      Result Value Ref Range Status   MRSA by PCR NEGATIVE  NEGATIVE Final   Comment:            The GeneXpert MRSA Assay (FDA     approved for NASAL specimens     only), is one component of a     comprehensive MRSA colonization  surveillance program. It is not     intended to diagnose MRSA     infection nor to guide or     monitor treatment for     MRSA infections.    Studies/Results: Ct Chest Wo Contrast  06/19/2013   CLINICAL DATA:  Shortness of breath. Abnormal radiograph demonstrating extensive infiltrates.  EXAM: CT CHEST WITHOUT CONTRAST  TECHNIQUE: Multidetector CT imaging of the chest was performed following the standard protocol without IV contrast.  COMPARISON:  CT scan dated 11/10/2008  FINDINGS: The patient has developed hilar and mediastinal adenopathy since the prior exam. Subcarinal lymph node measures 2.6 cm AP dimension on image 33. Precarinal lymph nodes measure 11 mm and 15 mm  on images 23 and 26 respectively. There is also new adenopathy in the aortopulmonary window.  The patient has developed severe interstitial lung disease superimposed on pre-existing emphysema since the prior 11/10/2008. The patient has also developed bronchiectasis in the left lower lobe with areas of mucous plugging and atelectasis at the left base. There is also progressive bronchiectasis at the right lung base.  Heart size is normal. No effusions. No acute osseous abnormality. Images through the upper abdomen demonstrate the 3 cm hepatic cyst in the anterior aspect of the dome of the left lobe of the liver, increased from 1.8 cm on 11/10/2008.  IMPRESSION: 1. New extensive interstitial lung disease primarily in the upper lobes with progressive bronchiectasis at the right base and new extensive bronchiectasis at the left base. 2. New hilar and mediastinal adenopathy. 3. The possibility of sarcoidosis should be considered. The patient has had previous resection of of a portion of the posterior aspect of the left seventh rib. Has the patient had a prior lung biopsy?   Electronically Signed   By: Rozetta Nunnery M.D.   On: 06/19/2013 17:46   Dg Chest Port 1 View  06/19/2013   CLINICAL DATA:  Shortness of breath  EXAM: PORTABLE CHEST - 1 VIEW  COMPARISON:  12/16/2005  FINDINGS: Cardiac shadow is within normal limits. Diffuse interstitial and alveolar changes are noted throughout both lungs. This may all be chronic in nature although this is a significant interval change when compared with the prior exam of 8 years previous. Acute infiltrate cannot be totally excluded. Slight increase consolidation is noted in the left retrocardiac region. CT may be helpful for further evaluation.  IMPRESSION: Diffuse changes through both lungs which may be related to chronic fibrosis although the possibility of superimposed infection cannot be totally excluded. CT may be helpful for further evaluation.   Electronically Signed   By:  Inez Catalina M.D.   On: 06/19/2013 16:21   Ct Maxillofacial Wo Cm  06/19/2013   CLINICAL DATA:  Fall.  Right facial injury.  EXAM: CT MAXILLOFACIAL WITHOUT CONTRAST  TECHNIQUE: Multidetector CT imaging of the maxillofacial structures was performed. Multiplanar CT image reconstructions were also generated. A small metallic BB was placed on the right temple in order to reliably differentiate right from left.  COMPARISON:  CT head 11/18/2004  FINDINGS: Atrophy and ventriculomegaly is unchanged.  Negative for facial fracture. No fracture of the orbit or mandible. No air-fluid level in the paranasal sinuses. Mild mucosal thickening in the paranasal sinuses. Cervical spondylosis.  IMPRESSION: Negative for facial fracture.   Electronically Signed   By: Franchot Gallo M.D.   On: 06/19/2013 17:37    Medications: Scheduled Meds: . ALPRAZolam  0.5 mg Oral BID  . antiseptic oral rinse  15 mL Mouth  Rinse BID  . aspirin EC  325 mg Oral Daily  . atorvastatin  20 mg Oral q1800  . budesonide-formoterol  2 puff Inhalation BID  . cholecalciferol  2,000 Units Oral Daily  . colestipol  2 g Oral BID  . heparin  5,000 Units Subcutaneous 3 times per day  . ipratropium-albuterol  3 mL Nebulization Q4H  . levofloxacin (LEVAQUIN) IV  750 mg Intravenous Q24H  . methylPREDNISolone (SOLU-MEDROL) injection  125 mg Intravenous Q12H  . oxybutynin  5 mg Oral BID  . pantoprazole  40 mg Oral Daily  . [START ON 06/21/2013] pneumococcal 23 valent vaccine  0.5 mL Intramuscular Tomorrow-1000  . raloxifene  60 mg Oral QODAY  . sodium chloride  3 mL Intravenous Q12H  . triamterene-hydrochlorothiazide  1 capsule Oral Daily      LOS: 1 day   Winta Barcelo M.D. [facility] 06/20/2013, 9:00 AM Pager: [phone]  If 7PM-7AM, please contact night-coverage [email or web address] Password [email or web address]  **Disclaimer: This note was dictated with voice recognition software. Similar sounding words can inadvertently be transcribed and this note  may contain transcription errors which may not have been corrected upon publication of note.**

## 2013-06-20 NOTE — Progress Notes (Signed)
Utilization review completed. Vardaan Depascale, RN, BSN. 

## 2013-06-21 DIAGNOSIS — E43 Unspecified severe protein-calorie malnutrition: Secondary | ICD-10-CM | POA: Insufficient documentation

## 2013-06-21 DIAGNOSIS — J441 Chronic obstructive pulmonary disease with (acute) exacerbation: Secondary | ICD-10-CM | POA: Diagnosis not present

## 2013-06-21 DIAGNOSIS — R0609 Other forms of dyspnea: Secondary | ICD-10-CM | POA: Diagnosis not present

## 2013-06-21 DIAGNOSIS — J479 Bronchiectasis, uncomplicated: Secondary | ICD-10-CM | POA: Diagnosis not present

## 2013-06-21 LAB — COMPREHENSIVE METABOLIC PANEL
ALK PHOS: 83 U/L (ref 39–117)
ALT: 18 U/L (ref 0–35)
AST: 18 U/L (ref 0–37)
Albumin: 2.2 g/dL — ABNORMAL LOW (ref 3.5–5.2)
BILIRUBIN TOTAL: 0.2 mg/dL — AB (ref 0.3–1.2)
BUN: 22 mg/dL (ref 6–23)
CHLORIDE: 97 meq/L (ref 96–112)
CO2: 29 meq/L (ref 19–32)
Calcium: 9.9 mg/dL (ref 8.4–10.5)
Creatinine, Ser: 0.73 mg/dL (ref 0.50–1.10)
GFR calc non Af Amer: 79 mL/min — ABNORMAL LOW (ref >90)
GLUCOSE: 182 mg/dL — AB (ref 70–99)
POTASSIUM: 3.5 meq/L — AB (ref 3.7–5.3)
SODIUM: 138 meq/L (ref 137–147)
TOTAL PROTEIN: 5.2 g/dL — AB (ref 6.0–8.3)

## 2013-06-21 LAB — CBC WITH DIFFERENTIAL/PLATELET
Basophils Absolute: 0 10*3/uL (ref 0.0–0.1)
Basophils Relative: 0 % (ref 0–1)
EOS ABS: 0 10*3/uL (ref 0.0–0.7)
Eosinophils Relative: 0 % (ref 0–5)
HEMATOCRIT: 30.6 % — AB (ref 36.0–46.0)
Hemoglobin: 10.5 g/dL — ABNORMAL LOW (ref 12.0–15.0)
LYMPHS PCT: 6 % — AB (ref 12–46)
Lymphs Abs: 1.8 10*3/uL (ref 0.7–4.0)
MCH: 26.3 pg (ref 26.0–34.0)
MCHC: 34.3 g/dL (ref 30.0–36.0)
MCV: 76.5 fL — ABNORMAL LOW (ref 78.0–100.0)
Monocytes Absolute: 1.5 10*3/uL — ABNORMAL HIGH (ref 0.1–1.0)
Monocytes Relative: 5 % (ref 3–12)
NEUTROS ABS: 26.2 10*3/uL — AB (ref 1.7–7.7)
Neutrophils Relative %: 89 % — ABNORMAL HIGH (ref 43–77)
PLATELETS: 254 10*3/uL (ref 150–400)
RBC: 4 MIL/uL (ref 3.87–5.11)
RDW: 15.5 % (ref 11.5–15.5)
WBC: 29.5 10*3/uL — ABNORMAL HIGH (ref 4.0–10.5)

## 2013-06-21 LAB — ANTI-NUCLEAR AB-TITER (ANA TITER): ANA Titer 1: 1:40 {titer} — ABNORMAL HIGH

## 2013-06-21 LAB — ANA: ANA: POSITIVE — AB

## 2013-06-21 MED ORDER — PREDNISONE 20 MG PO TABS
40.0000 mg | ORAL_TABLET | Freq: Every day | ORAL | Status: DC
  Administered 2013-06-22: 40 mg via ORAL
  Filled 2013-06-21 (×2): qty 2

## 2013-06-21 MED ORDER — POTASSIUM CHLORIDE CRYS ER 20 MEQ PO TBCR
40.0000 meq | EXTENDED_RELEASE_TABLET | Freq: Once | ORAL | Status: AC
  Administered 2013-06-21: 40 meq via ORAL
  Filled 2013-06-21: qty 2

## 2013-06-21 NOTE — Progress Notes (Signed)
[place] TEAM 1 - Stepdown/ICU TEAM Progress Note  [name] MGN:[id number] DOB: 08-27-1932 DOA: 06/19/2013 PCP: Redge Gainer, MD  Admit HPI / Brief Narrative: 80 y.o. year old female with notable history of COPD-not on O2, hyperlipidemia, lymphocytic colitis, remote history of PE, and colonic adenoma who presented with acute respiratory failure with hypoxia. Patient noted generalized weakness for a week, with worsening productive cough and mild dyspnea for 2-3 days.   Presented to the ER.  Afebrile on presentation. Heart rate in the 110s. Systolic pressures in the 100s. Initially satting in the mid to upper 80s on room air. White blood cell count 23.6, hemoglobin 14.2, creatinine 0.7, BUN 26, calcium 10.6. Lactate and trop WNL. Chest CT showed extensive interstitial lung disease primarily in the upper lobes with progressive bronchiectasis in bilateral bases as well as new hilar and mediastinal adenopathy ?of sarcoidosis.  HPI/Subjective: Pt feels she is a little more SOB today.  Denies cp.  Feels very tired.  No n/v, or abdom pain.    Assessment/Plan:  Acute respiratory failure with hypoxia with Emphysema exacerbation No further wheezing - taper steroids - cont nebs - assess for likely need for home O2  Tobacco abuse  Counseled on absolute need to stop smoking completely   Bronchiectasis / New extensive ILD upper lobe predominant, new hilar & mediastinal adenopathy - assess sputum for MAI - need 35mnth FU CT scan - consider bronchoscopy as out pt for etiology per Pulm   GERD Well controlled  Peripheral vascular disease, unspecified  Hyperlipidemia  Lymphocytic colitis   Severe malnutrition in the context of chronic illness / Underweight   Code Status: FULL Family Communication: no family present at time of exam Disposition Plan: SDU  Consultants: Pulmonary  Procedures: none  Antibiotics: levaquin 6/10 >>  DVT prophylaxis: SQ heparin   Objective: Blood  pressure 99/48, pulse 89, temperature 98 F (36.7 C), temperature source Oral, resp. rate 25, height 5\' 5"  (1.651 m), weight 50.9 kg (112 lb 3.4 oz), SpO2 94.00%.  Intake/Output Summary (Last 24 hours) at 06/21/13 1353 Last data filed at 06/21/13 0600  Gross per 24 hour  Intake    110 ml  Output      0 ml  Net    110 ml   Exam: General: No acute respiratory distress when at rest in bed  Lungs: fine crackles scattered th/o - no wheeze  Cardiovascular: Regular rate and rhythm without murmur gallop or rub normal S1 and S2 Abdomen: Nontender, nondistended, soft, bowel sounds positive, no rebound, no ascites, no appreciable mass Extremities: No significant cyanosis, clubbing, or edema bilateral lower extremities  Data Reviewed: Basic Metabolic Panel:  Recent Labs Lab 06/19/13 1607 06/20/13 0323 06/21/13 0250  NA 136* 136* 138  K 4.5 3.9 3.5*  CL 94* 94* 97  CO2 26 22 29   GLUCOSE 83 107* 182*  BUN 26* 25* 22  CREATININE 0.70 0.62 0.73  CALCIUM 10.6* 10.0 9.9   Liver Function Tests:  Recent Labs Lab 06/20/13 0323 06/21/13 0250  AST 19 18  ALT 18 18  ALKPHOS 98 83  BILITOT 0.4 0.2*  PROT 5.6* 5.2*  ALBUMIN 2.3* 2.2*   CBC:  Recent Labs Lab 06/19/13 1607 06/19/13 1945 06/20/13 0323 06/21/13 0250  WBC 23.6* 24.8* 15.5* 29.5*  NEUTROABS  --  21.0* 14.1* 26.2*  HGB 14.2 13.5 11.9* 10.5*  HCT 41.0 39.3 35.5* 30.6*  MCV 77.8* 78.0 78.7 76.5*  PLT 228 243 228 254  Recent Results (from the past 240 hour(s))  MRSA PCR SCREENING     Status: None   Collection Time    06/19/13 11:22 PM      Result Value Ref Range Status   MRSA by PCR NEGATIVE  NEGATIVE Final   Comment:            The GeneXpert MRSA Assay (FDA     approved for NASAL specimens     only), is one component of a     comprehensive MRSA colonization     surveillance program. It is not     intended to diagnose MRSA     infection nor to guide or     monitor treatment for     MRSA infections.    CULTURE, EXPECTORATED SPUTUM-ASSESSMENT     Status: None   Collection Time    06/20/13 12:23 PM      Result Value Ref Range Status   Specimen Description SPUTUM   Final   Special Requests NONE   Final   Sputum evaluation     Final   Value: THIS SPECIMEN IS ACCEPTABLE. RESPIRATORY CULTURE REPORT TO FOLLOW.   Report Status 06/20/2013 FINAL   Final  CULTURE, RESPIRATORY (NON-EXPECTORATED)     Status: None   Collection Time    06/20/13 12:23 PM      Result Value Ref Range Status   Specimen Description SPUTUM   Final   Special Requests NONE   Final   Gram Stain     Final   Value: NO WBC SEEN     RARE SQUAMOUS EPITHELIAL CELLS PRESENT     RARE GRAM POSITIVE COCCI     IN PAIRS RARE GRAM POSITIVE RODS     Performed at [facility]   Culture     Final   Value: NORMAL OROPHARYNGEAL FLORA     Performed at [facility]   Report Status PENDING   Incomplete     Studies:  Recent x-ray studies have been reviewed in detail by the Attending Physician  Scheduled Meds:  Scheduled Meds: . ALPRAZolam  0.5 mg Oral BID  . antiseptic oral rinse  15 mL Mouth Rinse BID  . aspirin EC  325 mg Oral Daily  . atorvastatin  20 mg Oral q1800  . budesonide-formoterol  2 puff Inhalation BID  . cholecalciferol  2,000 Units Oral Daily  . colestipol  2 g Oral BID  . feeding supplement (ENSURE COMPLETE)  237 mL Oral BID BM  . heparin  5,000 Units Subcutaneous 3 times per day  . ipratropium-albuterol  3 mL Nebulization Q6H  . levofloxacin (LEVAQUIN) IV  750 mg Intravenous Q24H  . methylPREDNISolone (SOLU-MEDROL) injection  40 mg Intravenous Q12H  . oxybutynin  5 mg Oral BID  . pantoprazole  40 mg Oral Daily  . pneumococcal 23 valent vaccine  0.5 mL Intramuscular Tomorrow-1000  . raloxifene  60 mg Oral QODAY  . sodium chloride  3 mL Intravenous Q12H  . triamterene-hydrochlorothiazide  1 capsule Oral Daily    Time spent on care of this patient: 35 mins   Jaeveon Ashland T , MD    [facility] Office  [phone] Pager - Text Page per Shea Evans as per below:  On-Call/Text Page:      [email or web address]      password [email or web address]  If 7PM-7AM, please contact night-coverage [email or web address] Password [email or web address] 06/21/2013, 1:53 PM   LOS: 2 days

## 2013-06-21 NOTE — Progress Notes (Signed)
Name: [name] MRN: [id number] DOB: 20-Jun-1932    ADMISSION DATE:  06/19/2013 CONSULTATION DATE:  6/11  PCP: Dr.  Laurance Flatten at [facility] MD :  [place]:  [facility]  CHIEF COMPLAINT/ reason for consult: bronchiectasis   BRIEF PATIENT DESCRIPTION:  80 year old female w/ reported h/o COPD, HL, remote PE, colonic adenoma and lymphocytic colitis, admitted on 6/10 w/ cc: 1 week h/o progressive weakness, c/b falls at home, worsening cough but unable to produce sputum. Then progressive SOB over the 3 days prior to presentation w/ increased wheezing. On presentation in the ER her sats were in 80s on room air, wbc CT 23.6. CT of chest showed: New extensive interstitial lung disease primarily in the upper lobes with progressive bronchiectasis at the right base and new extensive bronchiectasis at the left base. And New hilar and mediastinal adenopathy- compared to CT from 2010.   SIGNIFICANT EVENTS: 6/10 - Admit with falls, SOB  STUDIES:  6/10 - new extensive ILD, upper lobe predominant, new hilar & mediastinal adenopathy, previous rib resection  LINES / TUBES:  CULTURES: MRSA PCXR 6/10: neg   ANTIBIOTICS: Levaquin 6/10 >>   SUBJECTIVE: afebrile Improving dyspnea  VITAL SIGNS: Temp:  [97.3 F (36.3 C)-98.1 F (36.7 C)] 97.6 F (36.4 C) (06/12 0820) Pulse Rate:  [89-111] 89 (06/12 0913) Resp:  [25] 25 (06/12 0913) BP: (74-102)/(44-58) 99/48 mmHg (06/12 0413) SpO2:  [90 %-96 %] 94 % (06/12 0913) Weight:  [50.9 kg (112 lb 3.4 oz)] 50.9 kg (112 lb 3.4 oz) (06/12 0413)  PHYSICAL EXAMINATION: General:  Thin adult female in NAD Neuro:  AAOx4, speech clear, MAE HEENT:  Mm pink/moist, no JVD Neck:  No LAN Cardiovascular:  s1s2 rrr, no m/r/g Lungs:  resp's even/non-labored, moist cough, lungs with crackles L base Abdomen:  Flat, soft, non-tender, no HSPM Musculoskeletal:  No acute deformities Skin:  Warm/dry, 1+ pitting edema   Recent  Labs Lab 06/19/13 1607 06/20/13 0323 06/21/13 0250  NA 136* 136* 138  K 4.5 3.9 3.5*  CL 94* 94* 97  CO2 '26 22 29  ' BUN 26* 25* 22  CREATININE 0.70 0.62 0.73  GLUCOSE 83 107* 182*    Recent Labs Lab 06/19/13 1945 06/20/13 0323 06/21/13 0250  HGB 13.5 11.9* 10.5*  HCT 39.3 35.5* 30.6*  WBC 24.8* 15.5* 29.5*  PLT 243 228 254   Ct Chest Wo Contrast  06/19/2013   CLINICAL DATA:  Shortness of breath. Abnormal radiograph demonstrating extensive infiltrates.  EXAM: CT CHEST WITHOUT CONTRAST  TECHNIQUE: Multidetector CT imaging of the chest was performed following the standard protocol without IV contrast.  COMPARISON:  CT scan dated 11/10/2008  FINDINGS: The patient has developed hilar and mediastinal adenopathy since the prior exam. Subcarinal lymph node measures 2.6 cm AP dimension on image 33. Precarinal lymph nodes measure 11 mm and 15 mm on images 23 and 26 respectively. There is also new adenopathy in the aortopulmonary window.  The patient has developed severe interstitial lung disease superimposed on pre-existing emphysema since the prior 11/10/2008. The patient has also developed bronchiectasis in the left lower lobe with areas of mucous plugging and atelectasis at the left base. There is also progressive bronchiectasis at the right lung base.  Heart size is normal. No effusions. No acute osseous abnormality. Images through the upper abdomen demonstrate the 3 cm hepatic cyst in the anterior aspect of the dome of the left lobe of the liver, increased from 1.8  cm on 11/10/2008.  IMPRESSION: 1. New extensive interstitial lung disease primarily in the upper lobes with progressive bronchiectasis at the right base and new extensive bronchiectasis at the left base. 2. New hilar and mediastinal adenopathy. 3. The possibility of sarcoidosis should be considered. The patient has had previous resection of of a portion of the posterior aspect of the left seventh rib. Has the patient had a prior lung  biopsy?   Electronically Signed   By: Rozetta Nunnery M.D.   On: 06/19/2013 17:46   Dg Chest Port 1 View  06/19/2013   CLINICAL DATA:  Shortness of breath  EXAM: PORTABLE CHEST - 1 VIEW  COMPARISON:  12/16/2005  FINDINGS: Cardiac shadow is within normal limits. Diffuse interstitial and alveolar changes are noted throughout both lungs. This may all be chronic in nature although this is a significant interval change when compared with the prior exam of 8 years previous. Acute infiltrate cannot be totally excluded. Slight increase consolidation is noted in the left retrocardiac region. CT may be helpful for further evaluation.  IMPRESSION: Diffuse changes through both lungs which may be related to chronic fibrosis although the possibility of superimposed infection cannot be totally excluded. CT may be helpful for further evaluation.   Electronically Signed   By: Inez Catalina M.D.   On: 06/19/2013 16:21   Ct Maxillofacial Wo Cm  06/19/2013   CLINICAL DATA:  Fall.  Right facial injury.  EXAM: CT MAXILLOFACIAL WITHOUT CONTRAST  TECHNIQUE: Multidetector CT imaging of the maxillofacial structures was performed. Multiplanar CT image reconstructions were also generated. A small metallic BB was placed on the right temple in order to reliably differentiate right from left.  COMPARISON:  CT head 11/18/2004  FINDINGS: Atrophy and ventriculomegaly is unchanged.  Negative for facial fracture. No fracture of the orbit or mandible. No air-fluid level in the paranasal sinuses. Mild mucosal thickening in the paranasal sinuses. Cervical spondylosis.  IMPRESSION: Negative for facial fracture.   Electronically Signed   By: Franchot Gallo M.D.   On: 06/19/2013 17:37    ASSESSMENT / PLAN:  Bronchiectasis - new changes on CT, r/o MAI, sarcoid could be unifying diagnosis but 80 y.o woman not typical profile Mediastinal lymphadenopathy -ACE 48, RA mild pos, ESR 30, ANA pending Emphysema / COPD Hypoxemia  Tobacco  Abuse  Plan: -oxygen to keep sats 88-95% -assess for O2 needs prior to d/c, anticipate she will need -smoking cessation -continue current pulmonary regimen:  Duoneb, symbicort -dc solu-medrol, can taper to prednisone  since no  bspasm  -Levaquin x 10-14 days  -PPI -assess sputum for MAI (Not TB, does not need isolation precautions) -pulmonary hygiene: flutter valve, cough etc -Will likely need 41mth FU CT scan - can consider bronchoscopy as out pt for etiology (blind transbronchial biopsy has about 70% yield for sarcoid, preferably would need EBus or mediastonoscopy for lymph node biopsy - best to hold off on these considerations for now)  Discussed above recommendations with pt & daughter KJuliann Pulse(who is a nMarine scientist PCCM to sign off - FU appt made  RKara MeadMD. FShade Flood [facility] Pager [phone] no response call [phone]     06/21/2013, 10:09 AM

## 2013-06-22 DIAGNOSIS — R0609 Other forms of dyspnea: Secondary | ICD-10-CM | POA: Diagnosis not present

## 2013-06-22 LAB — EXPECTORATED SPUTUM ASSESSMENT W GRAM STAIN, RFLX TO RESP C

## 2013-06-22 LAB — CULTURE, RESPIRATORY W GRAM STAIN
Culture: NORMAL
Gram Stain: NONE SEEN

## 2013-06-22 LAB — CULTURE, RESPIRATORY

## 2013-06-22 LAB — EXPECTORATED SPUTUM ASSESSMENT W REFEX TO RESP CULTURE

## 2013-06-22 MED ORDER — LEVOFLOXACIN 750 MG PO TABS
750.0000 mg | ORAL_TABLET | Freq: Every day | ORAL | Status: DC
  Administered 2013-06-22 – 2013-06-23 (×2): 750 mg via ORAL
  Filled 2013-06-22 (×2): qty 1

## 2013-06-22 MED ORDER — POTASSIUM CHLORIDE CRYS ER 20 MEQ PO TBCR
40.0000 meq | EXTENDED_RELEASE_TABLET | Freq: Two times a day (BID) | ORAL | Status: AC
  Administered 2013-06-22 (×2): 40 meq via ORAL
  Filled 2013-06-22 (×2): qty 2

## 2013-06-22 MED ORDER — PREDNISONE 20 MG PO TABS
20.0000 mg | ORAL_TABLET | Freq: Every day | ORAL | Status: DC
  Administered 2013-06-23 – 2013-06-24 (×2): 20 mg via ORAL
  Filled 2013-06-22 (×3): qty 1

## 2013-06-22 NOTE — Progress Notes (Signed)
[place] TEAM 1 - Stepdown/ICU TEAM Progress Note  [name] OTL:[id number] DOB: 1932/07/11 DOA: 06/19/2013 PCP: Redge Gainer, MD  Admit HPI / Brief Narrative: 80 year old female with notable history of COPD not on O2, hyperlipidemia, lymphocytic colitis, remote history of PE, and colonic adenoma who presented with acute respiratory failure with hypoxia. Patient noted generalized weakness for a week, with worsening productive cough and mild dyspnea for 2-3 days.   Presented to the ER.  Afebrile on presentation. Heart rate in the 110s. Systolic pressures in the 100s. Initially satting in the mid to upper 80s on room air. White blood cell count 23.6, hemoglobin 14.2, creatinine 0.7, BUN 26, calcium 10.6. Lactate and trop WNL. Chest CT showed extensive interstitial lung disease primarily in the upper lobes with progressive bronchiectasis in bilateral bases as well as new hilar and mediastinal adenopathy ?of sarcoidosis.  Since admission, Pulm has evaluated the pt.  She has slowly improved w/ medical tx.  The decision has been made to have her f/u w/ Pulm as an outpt to consider further eval, likely with a bronchoscopy.  She will also need to be evaluated for the possible need for home O2 prior to her d/c.   HPI/Subjective: Pt is feeling better today.  Remains quite weak.  SOB has improved.  No cp, n/v, or abdom pain.    Assessment/Plan:  Acute respiratory failure with hypoxia with Emphysema exacerbation No further wheezing - tapering steroids - cont nebs - assess for likely need for home O2 - sputum for MAI and routine culture pending - to have 44mo f/u CT via Pulm office - planned 14 days of levaquin tx   Tobacco abuse  Counseled on absolute need to stop smoking completely   Bronchiectasis / New extensive ILD upper lobe predominant, new hilar & mediastinal adenopathy - assess sputum for MAI - need 51mnth FU CT scan - consider bronchoscopy as out pt for etiology per Pulm   GERD Well  controlled  Peripheral vascular disease, unspecified Sp recent stents to B LE per Dr. Oneida Alar - asymptomatic - exam unrevealing   Hyperlipidemia Cont Crestor at time of d/c   Lymphocytic colitis  Quiescent   Severe malnutrition in the context of chronic illness / Underweight   Code Status: FULL Family Communication: no family present at time of exam Disposition Plan: SDU  Consultants: Pulmonary  Procedures: none  Antibiotics: levaquin 6/10 >>  DVT prophylaxis: SQ heparin   Objective: Blood pressure 95/45, pulse 87, temperature 97.5 F (36.4 C), temperature source Oral, resp. rate 20, height 5\' 5"  (1.651 m), weight 50.9 kg (112 lb 3.4 oz), SpO2 95.00%.  Intake/Output Summary (Last 24 hours) at 06/22/13 1459 Last data filed at 06/22/13 0900  Gross per 24 hour  Intake    420 ml  Output      0 ml  Net    420 ml   Exam: General: no acute respiratory distress at rest in bed  Lungs: fine crackles scattered th/o - no wheeze  Cardiovascular: regular rate and rhythm without murmur gallop or rub Abdomen: nontender, nondistended, soft, bowel sounds positive, no rebound, no ascites, no appreciable mass Extremities: no significant cyanosis, clubbing, or edema bilateral lower extremities  Data Reviewed: Basic Metabolic Panel:  Recent Labs Lab 06/19/13 1607 06/20/13 0323 06/21/13 0250  NA 136* 136* 138  K 4.5 3.9 3.5*  CL 94* 94* 97  CO2 26 22 29   GLUCOSE 83 107* 182*  BUN 26* 25* 22  CREATININE  0.70 0.62 0.73  CALCIUM 10.6* 10.0 9.9   Liver Function Tests:  Recent Labs Lab 06/20/13 0323 06/21/13 0250  AST 19 18  ALT 18 18  ALKPHOS 98 83  BILITOT 0.4 0.2*  PROT 5.6* 5.2*  ALBUMIN 2.3* 2.2*   CBC:  Recent Labs Lab 06/19/13 1607 06/19/13 1945 06/20/13 0323 06/21/13 0250  WBC 23.6* 24.8* 15.5* 29.5*  NEUTROABS  --  21.0* 14.1* 26.2*  HGB 14.2 13.5 11.9* 10.5*  HCT 41.0 39.3 35.5* 30.6*  MCV 77.8* 78.0 78.7 76.5*  PLT 228 243 228 254    Recent  Results (from the past 240 hour(s))  MRSA PCR SCREENING     Status: None   Collection Time    06/19/13 11:22 PM      Result Value Ref Range Status   MRSA by PCR NEGATIVE  NEGATIVE Final   Comment:            The GeneXpert MRSA Assay (FDA     approved for NASAL specimens     only), is one component of a     comprehensive MRSA colonization     surveillance program. It is not     intended to diagnose MRSA     infection nor to guide or     monitor treatment for     MRSA infections.  CULTURE, EXPECTORATED SPUTUM-ASSESSMENT     Status: None   Collection Time    06/20/13 12:23 PM      Result Value Ref Range Status   Specimen Description SPUTUM   Final   Special Requests NONE   Final   Sputum evaluation     Final   Value: THIS SPECIMEN IS ACCEPTABLE. RESPIRATORY CULTURE REPORT TO FOLLOW.   Report Status 06/20/2013 FINAL   Final  CULTURE, RESPIRATORY (NON-EXPECTORATED)     Status: None   Collection Time    06/20/13 12:23 PM      Result Value Ref Range Status   Specimen Description SPUTUM   Final   Special Requests NONE   Final   Gram Stain     Final   Value: NO WBC SEEN     RARE SQUAMOUS EPITHELIAL CELLS PRESENT     RARE GRAM POSITIVE COCCI     IN PAIRS RARE GRAM POSITIVE RODS     Performed at [facility]   Culture     Final   Value: NORMAL OROPHARYNGEAL FLORA     Performed at [facility]   Report Status PENDING   Incomplete     Studies:  Recent x-ray studies have been reviewed in detail by the Attending Physician  Scheduled Meds:  Scheduled Meds: . ALPRAZolam  0.5 mg Oral BID  . antiseptic oral rinse  15 mL Mouth Rinse BID  . aspirin EC  325 mg Oral Daily  . budesonide-formoterol  2 puff Inhalation BID  . cholecalciferol  2,000 Units Oral Daily  . colestipol  2 g Oral BID  . feeding supplement (ENSURE COMPLETE)  237 mL Oral BID BM  . heparin  5,000 Units Subcutaneous 3 times per day  . ipratropium-albuterol  3 mL Nebulization Q6H  . levofloxacin  (LEVAQUIN) IV  750 mg Intravenous Q24H  . oxybutynin  5 mg Oral BID  . pantoprazole  40 mg Oral Daily  . predniSONE  40 mg Oral Q breakfast  . raloxifene  60 mg Oral QODAY  . sodium chloride  3 mL Intravenous Q12H  . triamterene-hydrochlorothiazide  1 capsule  Oral Daily    Time spent on care of this patient: 35 mins   [facility] T , MD   [facility] Office  [phone] Pager - Text Page per Shea Evans as per below:  On-Call/Text Page:      [email or web address]      password [email or web address]  If 7PM-7AM, please contact night-coverage [email or web address] Password [email or web address] 06/22/2013, 2:59 PM   LOS: 3 days

## 2013-06-22 NOTE — Progress Notes (Signed)
Report called to [facility], pt transferring to [phone] via w/c with belongings.

## 2013-06-22 NOTE — Progress Notes (Signed)
NURSING PROGRESS NOTE  [name] [id number] Transfer Data: 06/22/2013 5:49 PM Attending Provider: Cherene Altes, MD GYI:RSWNI, DONALD, MD Code Status: FULL [name] is a 80 y.o. female patient transferred from [place]  -No acute distress noted.  -No complaints of shortness of breath.  -No complaints of chest pain.    Blood pressure 105/65, pulse 86, temperature 97.4 F (36.3 C), temperature source Oral, resp. rate 16, height 5' 5.5" (1.664 m), weight 50.168 kg (110 lb 9.6 oz), SpO2 96.00%.    Allergies:  Codeine; Penicillins; and Sulfa antibiotics  Past Medical History:   has a past medical history of Anxiety disorder; GERD (gastroesophageal reflux disease); COPD (chronic obstructive pulmonary disease); Depression; Hyperlipidemia; Peptic ulcer disease; Pneumonia (2007); Pulmonary embolism (2004); Colon adenoma; Pseudotumor cerebri; Irritable bowel syndrome; Lymphocytic colitis (12/15/2011); and Fall at home (May 17, 2012).  Past Surgical History:   has past surgical history that includes Cholecystectomy; Colon resection; Abdominal hysterectomy; Brain surgery; Cataract extraction; Upper gastrointestinal endoscopy; Colonoscopy; and Lower extremity stents.  Social History:   reports that she has been smoking Cigarettes.  She has a 30 pack-year smoking history. She has never used smokeless tobacco. She reports that she does not drink alcohol or use illicit drugs.  Skin: Stage II noted to sacrum area.   Patient/Family orientated to room. Information packet given to patient/family. Admission inpatient armband information verified with patient/family to include name and date of birth and placed on patient arm. Side rails up x 2, fall assessment and education completed with patient/family. Patient/family able to verbalize understanding of risk associated with falls and verbalized understanding to call for assistance before getting out of bed. Call light within reach. Patient/family  able to voice and demonstrate understanding of unit orientation instructions.    Will continue to evaluate and treat per MD orders.

## 2013-06-22 NOTE — Evaluation (Addendum)
Physical Therapy Evaluation Patient Details Name: [name] MRN: [id number] DOB: 09-Jan-1933 Today's Date: 06/22/2013   History of Present Illness  Admitted with general weakness, dyspnea and cough over last few days before admission.  Admitting dx of respiratory failure with hypoxia.  Clinical Impression  Pt admitted with/for respiratory failur and hypoxia.  Pt currently limited functionally due to the problems listed. ( See problems list.)   Pt will benefit from PT to maximize function and safety in order to get ready for next venue listed below.     Follow Up Recommendations SNF    Equipment Recommendations  Other (comment) (TBD)    Recommendations for Other Services       Precautions / Restrictions Precautions Precautions: Fall      Mobility  Bed Mobility Overal bed mobility: Needs Assistance Bed Mobility: Supine to Sit     Supine to sit: Min assist     General bed mobility comments: cues for best hand placement, minimal assist  Transfers Overall transfer level: Needs assistance   Transfers: Sit to/from Stand Sit to Stand: Min assist         General transfer comment: stability assist; cues for hand placement  Ambulation/Gait Ambulation/Gait assistance: Min assist Ambulation Distance (Feet): 110 Feet   Gait Pattern/deviations: Step-through pattern;Trunk flexed Gait velocity: slow   General Gait Details: mildly unsteady. Tendency to wander  Stairs            Wheelchair Mobility    Modified Rankin (Stroke Patients Only)       Balance Overall balance assessment: Needs assistance Sitting-balance support: No upper extremity supported Sitting balance-Leahy Scale: Fair Sitting balance - Comments: can sit EOB without UE assist.  quickly fatigues   Standing balance support: Single extremity supported Standing balance-Leahy Scale: Poor                               Pertinent Vitals/Pain Sats on 3L [place] during ambulation variable  from high 80's to low 90's with dyspnea and EHR in the 100's    Home Living Family/patient expects to be discharged to:: Private residence Living Arrangements: Alone Available Help at Discharge: Available PRN/intermittently Type of Home: House Home Access: Stairs to enter Entrance Stairs-Rails: Psychiatric nurse of Steps: several Home Layout: Two level;Able to live on main level with bedroom/bathroom Home Equipment: None Additional Comments: pt "cruises" the furniture and wall to maintain stability    Prior Function Level of Independence: Independent;Independent with assistive device(s)               Hand Dominance        Extremity/Trunk Assessment               Lower Extremity Assessment: Generalized weakness         Communication   Communication: No difficulties  Cognition Arousal/Alertness: Awake/alert Behavior During Therapy: WFL for tasks assessed/performed Overall Cognitive Status: Within Functional Limits for tasks assessed                      General Comments      Exercises        Assessment/Plan    PT Assessment Patient needs continued PT services  PT Diagnosis Difficulty walking;Generalized weakness (decr activity tolerance)   PT Problem List Decreased strength;Decreased activity tolerance;Decreased balance;Decreased mobility;Decreased knowledge of use of DME;Cardiopulmonary status limiting activity  PT Treatment Interventions DME instruction;Gait training;Stair training;Functional mobility training;Therapeutic activities;Balance training;Patient/family  education   PT Goals (Current goals can be found in the Care Plan section) Acute Rehab PT Goals Patient Stated Goal: back home alone PT Goal Formulation: With patient Time For Goal Achievement: 07/06/13 Potential to Achieve Goals: Good    Frequency Min 3X/week   Barriers to discharge Decreased caregiver support      Co-evaluation               End of  Session Equipment Utilized During Treatment: Oxygen Activity Tolerance: Patient tolerated treatment well;Patient limited by fatigue Patient left: in chair;with call bell/phone within reach;with family/visitor present Nurse Communication: Mobility status         Time: 1100-1133 PT Time Calculation (min): 33 min   Charges:   PT Evaluation $Initial PT Evaluation Tier I: 1 Procedure PT Treatments $Gait Training: 8-22 mins $Therapeutic Activity: 8-22 mins   PT G Codes:          Janitza Revuelta, Tessie Fass 06/22/2013, 12:40 PM

## 2013-06-23 DIAGNOSIS — E43 Unspecified severe protein-calorie malnutrition: Secondary | ICD-10-CM

## 2013-06-23 DIAGNOSIS — J479 Bronchiectasis, uncomplicated: Secondary | ICD-10-CM | POA: Diagnosis not present

## 2013-06-23 DIAGNOSIS — J441 Chronic obstructive pulmonary disease with (acute) exacerbation: Secondary | ICD-10-CM | POA: Diagnosis not present

## 2013-06-23 DIAGNOSIS — J9600 Acute respiratory failure, unspecified whether with hypoxia or hypercapnia: Secondary | ICD-10-CM

## 2013-06-23 LAB — BASIC METABOLIC PANEL
BUN: 15 mg/dL (ref 6–23)
CALCIUM: 9.6 mg/dL (ref 8.4–10.5)
CO2: 29 meq/L (ref 19–32)
Chloride: 103 mEq/L (ref 96–112)
Creatinine, Ser: 0.65 mg/dL (ref 0.50–1.10)
GFR calc Af Amer: >90 mL/min (ref >90)
GFR calc non Af Amer: 82 mL/min — ABNORMAL LOW (ref >90)
GLUCOSE: 89 mg/dL (ref 70–99)
Potassium: 5.4 mEq/L — ABNORMAL HIGH (ref 3.7–5.3)
Sodium: 140 mEq/L (ref 137–147)

## 2013-06-23 MED ORDER — LEVOFLOXACIN 750 MG PO TABS
750.0000 mg | ORAL_TABLET | ORAL | Status: DC
  Administered 2013-06-25: 750 mg via ORAL
  Filled 2013-06-23: qty 1

## 2013-06-23 MED ORDER — IPRATROPIUM-ALBUTEROL 0.5-2.5 (3) MG/3ML IN SOLN
3.0000 mL | Freq: Two times a day (BID) | RESPIRATORY_TRACT | Status: DC
  Administered 2013-06-23 – 2013-06-24 (×4): 3 mL via RESPIRATORY_TRACT
  Filled 2013-06-23 (×5): qty 3

## 2013-06-23 NOTE — Progress Notes (Signed)
PATIENT DETAILS Name: [name] Age: 80 y.o. Sex: female Date of Birth: [birth year] Admit Date: 06/19/2013 Admitting Physician Shanda Howells, MD [id number], Elenore Rota, MD  Subjective: Breathing better, DOES NOT WANT TO GO TO SNF!  Assessment/Plan: Principal Problem: Acute respiratory failure with hypoxia -secondary to Emphysema exacerbation  -Improved, no further wheezing - tapering steroids - cont nebs - assess for likely need for home O2 - sputum for MAI and routine culture pending - to have 74mo f/u CT via Pulm office - planned 14 days of levaquin tx -started 6/10 -suspect will likely need O2 on discharge-will ask RN to perform O2 saturation screen  Active Problems: COPD Exacerbation -significantly improved, initially started on IV Solumedrol,IV Levaquin and nebulized bronchodilators. Now has been transitioned to oral levaquin and tapering prednisone.  -Lungs with no wheezing.  Bronchiectasis / New extensive ILD  -upper lobe predominant, new hilar & mediastinal adenopathy -  sputum for MAI pending - need 23mnth FU CT scan -seen by PCCM, plans are for further work up with bronchoscopy as out pt per Pulm   Tobacco abuse  -Counseled on absolute need to stop smoking completely   GERD  -Well controlled   Peripheral vascular disease, unspecified  -S/p recent stents to B LE per Dr. Oneida Alar - asymptomatic - exam unrevealing  -c/w ASA  Hyperlipidemia  -Cont Crestor at time of d/c   Protein-calorie malnutrition, severe -c/w supplements  Deconditioning -secondary to acute illness -seen by PT eval-recommeded SNF-refusing-have spoken daughter-Kathy over the phone, if continues to refuse, may need to discharge with HHPT. Daughter aware  Disposition: Remain inpatient  DVT Prophylaxis: Prophylactic  Heparin   Code Status: Full code   Family Communication Daughter-Kathy-over the phone  Procedures:  None  CONSULTS:  pulmonary/intensive care  Time spent 40  minutes-which includes 50% of the time with face-to-face with patient/ family and coordinating care related to the above assessment and plan.    MEDICATIONS: Scheduled Meds: . ALPRAZolam  0.5 mg Oral BID  . antiseptic oral rinse  15 mL Mouth Rinse BID  . aspirin EC  325 mg Oral Daily  . budesonide-formoterol  2 puff Inhalation BID  . cholecalciferol  2,000 Units Oral Daily  . colestipol  2 g Oral BID  . feeding supplement (ENSURE COMPLETE)  237 mL Oral BID BM  . heparin  5,000 Units Subcutaneous 3 times per day  . ipratropium-albuterol  3 mL Nebulization BID  . [START ON 06/25/2013] levofloxacin  750 mg Oral Q48H  . oxybutynin  5 mg Oral BID  . pantoprazole  40 mg Oral Daily  . predniSONE  20 mg Oral Q breakfast  . raloxifene  60 mg Oral QODAY  . sodium chloride  3 mL Intravenous Q12H   Continuous Infusions:  PRN Meds:.ipratropium-albuterol  Antibiotics: Anti-infectives   Start     Dose/Rate Route Frequency Ordered Stop   06/25/13 1100  levofloxacin (LEVAQUIN) tablet 750 mg     750 mg Oral Every 48 hours 06/23/13 1202     06/22/13 1630  levofloxacin (LEVAQUIN) tablet 750 mg  Status:  Discontinued     750 mg Oral Daily 06/22/13 1532 06/23/13 1202   06/19/13 1900  levofloxacin (LEVAQUIN) IVPB 750 mg  Status:  Discontinued     750 mg 100 mL/hr over 90 Minutes Intravenous Every 24 hours 06/19/13 1852 06/22/13 1532       PHYSICAL EXAM: Vital signs in last 24 hours: Filed Vitals:   06/22/13  2144 06/23/13 0500 06/23/13 0613 06/23/13 1019  BP:   92/56   Pulse:   82   Temp:   97.5 F (36.4 C)   TempSrc:   Oral   Resp:   16   Height:      Weight:  50.848 kg (112 lb 1.6 oz)    SpO2: 98%  94% 93%    Weight change:  Filed Weights   06/21/13 0413 06/22/13 1700 06/23/13 0500  Weight: 50.9 kg (112 lb 3.4 oz) 50.168 kg (110 lb 9.6 oz) 50.848 kg (112 lb 1.6 oz)   Body mass index is 18.36 kg/(m^2).   Gen Exam: Awake and alert with clear speech.   Neck: Supple, No JVD.     Chest: B/L Clear.   CVS: S1 S2 Regular, no murmurs.  Abdomen: soft, BS +, non tender, non distended.  Extremities: no edema, lower extremities warm to touch. Neurologic: Non Focal.   Skin: No Rash.   Wounds: N/A.   Intake/Output from previous day:  Intake/Output Summary (Last 24 hours) at 06/23/13 1221 Last data filed at 06/23/13 0900  Gross per 24 hour  Intake    240 ml  Output      0 ml  Net    240 ml     LAB RESULTS: CBC  Recent Labs Lab 06/19/13 1607 06/19/13 1945 06/20/13 0323 06/21/13 0250  WBC 23.6* 24.8* 15.5* 29.5*  HGB 14.2 13.5 11.9* 10.5*  HCT 41.0 39.3 35.5* 30.6*  PLT 228 243 228 254  MCV 77.8* 78.0 78.7 76.5*  MCH 26.9 26.8 26.4 26.3  MCHC 34.6 34.4 33.5 34.3  RDW 15.5 15.6* 15.8* 15.5  LYMPHSABS  --  1.6 0.9 1.8  MONOABS  --  1.2* 0.3 1.5*  EOSABS  --  1.0* 0.1 0.0  BASOSABS  --  0.0 0.0 0.0    Chemistries   Recent Labs Lab 06/19/13 1607 06/20/13 0323 06/21/13 0250 06/23/13 0556  NA 136* 136* 138 140  K 4.5 3.9 3.5* 5.4*  CL 94* 94* 97 103  CO2 26 22 29 29   GLUCOSE 83 107* 182* 89  BUN 26* 25* 22 15  CREATININE 0.70 0.62 0.73 0.65  CALCIUM 10.6* 10.0 9.9 9.6    CBG: No results found for this basename: GLUCAP,  in the last 168 hours  GFR Estimated Creatinine Clearance: 45.7 ml/min (by C-G formula based on Cr of 0.65).  Coagulation profile No results found for this basename: INR, PROTIME,  in the last 168 hours  Cardiac Enzymes No results found for this basename: CK, CKMB, TROPONINI, MYOGLOBIN,  in the last 168 hours  No components found with this basename: POCBNP,  No results found for this basename: DDIMER,  in the last 72 hours No results found for this basename: HGBA1C,  in the last 72 hours No results found for this basename: CHOL, HDL, LDLCALC, TRIG, CHOLHDL, LDLDIRECT,  in the last 72 hours No results found for this basename: TSH, T4TOTAL, FREET3, T3FREE, THYROIDAB,  in the last 72 hours No results found for this  basename: VITAMINB12, FOLATE, FERRITIN, TIBC, IRON, RETICCTPCT,  in the last 72 hours No results found for this basename: LIPASE, AMYLASE,  in the last 72 hours  Urine Studies No results found for this basename: UACOL, UAPR, USPG, UPH, UTP, UGL, UKET, UBIL, UHGB, UNIT, UROB, ULEU, UEPI, UWBC, URBC, UBAC, CAST, CRYS, UCOM, BILUA,  in the last 72 hours  MICROBIOLOGY: Recent Results (from the past 240 hour(s))  MRSA PCR SCREENING  Status: None   Collection Time    06/19/13 11:22 PM      Result Value Ref Range Status   MRSA by PCR NEGATIVE  NEGATIVE Final   Comment:            The GeneXpert MRSA Assay (FDA     approved for NASAL specimens     only), is one component of a     comprehensive MRSA colonization     surveillance program. It is not     intended to diagnose MRSA     infection nor to guide or     monitor treatment for     MRSA infections.  CULTURE, EXPECTORATED SPUTUM-ASSESSMENT     Status: None   Collection Time    06/20/13 12:23 PM      Result Value Ref Range Status   Specimen Description SPUTUM   Final   Special Requests NONE   Final   Sputum evaluation     Final   Value: THIS SPECIMEN IS ACCEPTABLE. RESPIRATORY CULTURE REPORT TO FOLLOW.   Report Status 06/20/2013 FINAL   Final  CULTURE, RESPIRATORY (NON-EXPECTORATED)     Status: None   Collection Time    06/20/13 12:23 PM      Result Value Ref Range Status   Specimen Description SPUTUM   Final   Special Requests NONE   Final   Gram Stain     Final   Value: NO WBC SEEN     RARE SQUAMOUS EPITHELIAL CELLS PRESENT     RARE GRAM POSITIVE COCCI     IN PAIRS RARE GRAM POSITIVE RODS     Performed at [facility]   Culture     Final   Value: NORMAL OROPHARYNGEAL FLORA     Performed at [facility]   Report Status 06/22/2013 FINAL   Final  CULTURE, EXPECTORATED SPUTUM-ASSESSMENT     Status: None   Collection Time    06/22/13  3:26 PM      Result Value Ref Range Status   Specimen Description SPUTUM    Final   Special Requests NONE   Final   Sputum evaluation     Final   Value: THIS SPECIMEN IS ACCEPTABLE. RESPIRATORY CULTURE REPORT TO FOLLOW.   Report Status 06/22/2013 FINAL   Final  CULTURE, RESPIRATORY (NON-EXPECTORATED)     Status: None   Collection Time    06/22/13  3:26 PM      Result Value Ref Range Status   Specimen Description SPUTUM   Final   Special Requests NONE   Final   Gram Stain     Final   Value: RARE WBC PRESENT, PREDOMINANTLY PMN     RARE SQUAMOUS EPITHELIAL CELLS PRESENT     RARE GRAM POSITIVE RODS     RARE Y     Performed at [facility]     Final   Value: Culture reincubated for better growth     Performed at [facility]   Report Status PENDING   Incomplete    RADIOLOGY STUDIES/RESULTS: Ct Chest Wo Contrast  06/19/2013   CLINICAL DATA:  Shortness of breath. Abnormal radiograph demonstrating extensive infiltrates.  EXAM: CT CHEST WITHOUT CONTRAST  TECHNIQUE: Multidetector CT imaging of the chest was performed following the standard protocol without IV contrast.  COMPARISON:  CT scan dated 11/10/2008  FINDINGS: The patient has developed hilar and mediastinal adenopathy since the prior exam. Subcarinal lymph node measures 2.6 cm  AP dimension on image 33. Precarinal lymph nodes measure 11 mm and 15 mm on images 23 and 26 respectively. There is also new adenopathy in the aortopulmonary window.  The patient has developed severe interstitial lung disease superimposed on pre-existing emphysema since the prior 11/10/2008. The patient has also developed bronchiectasis in the left lower lobe with areas of mucous plugging and atelectasis at the left base. There is also progressive bronchiectasis at the right lung base.  Heart size is normal. No effusions. No acute osseous abnormality. Images through the upper abdomen demonstrate the 3 cm hepatic cyst in the anterior aspect of the dome of the left lobe of the liver, increased from 1.8 cm on 11/10/2008.   IMPRESSION: 1. New extensive interstitial lung disease primarily in the upper lobes with progressive bronchiectasis at the right base and new extensive bronchiectasis at the left base. 2. New hilar and mediastinal adenopathy. 3. The possibility of sarcoidosis should be considered. The patient has had previous resection of of a portion of the posterior aspect of the left seventh rib. Has the patient had a prior lung biopsy?   Electronically Signed   By: Rozetta Nunnery M.D.   On: 06/19/2013 17:46   Dg Chest Port 1 View  06/19/2013   CLINICAL DATA:  Shortness of breath  EXAM: PORTABLE CHEST - 1 VIEW  COMPARISON:  12/16/2005  FINDINGS: Cardiac shadow is within normal limits. Diffuse interstitial and alveolar changes are noted throughout both lungs. This may all be chronic in nature although this is a significant interval change when compared with the prior exam of 8 years previous. Acute infiltrate cannot be totally excluded. Slight increase consolidation is noted in the left retrocardiac region. CT may be helpful for further evaluation.  IMPRESSION: Diffuse changes through both lungs which may be related to chronic fibrosis although the possibility of superimposed infection cannot be totally excluded. CT may be helpful for further evaluation.   Electronically Signed   By: Inez Catalina M.D.   On: 06/19/2013 16:21   Ct Maxillofacial Wo Cm  06/19/2013   CLINICAL DATA:  Fall.  Right facial injury.  EXAM: CT MAXILLOFACIAL WITHOUT CONTRAST  TECHNIQUE: Multidetector CT imaging of the maxillofacial structures was performed. Multiplanar CT image reconstructions were also generated. A small metallic BB was placed on the right temple in order to reliably differentiate right from left.  COMPARISON:  CT head 11/18/2004  FINDINGS: Atrophy and ventriculomegaly is unchanged.  Negative for facial fracture. No fracture of the orbit or mandible. No air-fluid level in the paranasal sinuses. Mild mucosal thickening in the paranasal  sinuses. Cervical spondylosis.  IMPRESSION: Negative for facial fracture.   Electronically Signed   By: Franchot Gallo M.D.   On: 06/19/2013 17:37    Oren Binet, MD  [facility] Pager:[phone]  If 7PM-7AM, please contact night-coverage [email or web address] Password [email or web address] 06/23/2013, 12:21 PM   LOS: 4 days   **Disclaimer: This note may have been dictated with voice recognition software. Similar sounding words can inadvertently be transcribed and this note may contain transcription errors which may not have been corrected upon publication of note.**

## 2013-06-23 NOTE — Progress Notes (Signed)
Physical Therapy Treatment Patient Details Name: [name] MRN: [id number] DOB: 02-26-1932 Today's Date: 06/23/2013    History of Present Illness Admitted with general weakness, dyspnea and cough over last few days before admission.  Admitting dx of respiratory failure with hypoxia.    PT Comments    Making improvements with gait distance.  Continues to require repeated cues for safety.  Agree with need for SNF at discharge.  Follow Up Recommendations  SNF     Equipment Recommendations  Other (comment) (TBD)    Recommendations for Other Services       Precautions / Restrictions Precautions Precautions: Fall Precaution Comments: Easily distracted and "trips" on walker Restrictions Weight Bearing Restrictions: No    Mobility  Bed Mobility Overal bed mobility: Needs Assistance Bed Mobility: Supine to Sit     Supine to sit: Min assist     General bed mobility comments: Assist to raise trunk to sitting position.  Increased time for transition.  Transfers Overall transfer level: Needs assistance Equipment used: Rolling walker (2 wheeled) Transfers: Sit to/from Stand Sit to Stand: Min assist         General transfer comment: Repeated cues for hand placement.  Assist for balance/safety.  Patient stood for pericare following BM in bed.  Step-by-step cues to safely turn and sit in chair.  Initially placed walker to the side before getting to chair.  Ambulation/Gait Ambulation/Gait assistance: Min assist Ambulation Distance (Feet): 130 Feet Assistive device: Rolling walker (2 wheeled) Gait Pattern/deviations: Step-through pattern;Decreased stride length;Trunk flexed Gait velocity: slow Gait velocity interpretation: Below normal speed for age/gender General Gait Details: Unsteady with gait.  Repeated cues to keep feet inside RW.  Tends to drift to right inside RW, and hits RW with Rt foot.  Repeated cues to correct this.   Stairs            Wheelchair  Mobility    Modified Rankin (Stroke Patients Only)       Balance           Standing balance support: Bilateral upper extremity supported Standing balance-Leahy Scale: Poor                      Cognition Arousal/Alertness: Awake/alert Behavior During Therapy: WFL for tasks assessed/performed Overall Cognitive Status: Within Functional Limits for tasks assessed (Decreased safety awareness/attention to task)                      Exercises      General Comments        Pertinent Vitals/Pain     Home Living                      Prior Function            PT Goals (current goals can now be found in the care plan section) Progress towards PT goals: Progressing toward goals    Frequency  Min 3X/week    PT Plan Current plan remains appropriate    Co-evaluation             End of Session Equipment Utilized During Treatment: Gait belt Activity Tolerance: Patient tolerated treatment well;Patient limited by fatigue Patient left: in chair;with call bell/phone within reach;with nursing/sitter in room     Time: 1156-1220 PT Time Calculation (min): 24 min  Charges:  $Gait Training: 23-37 mins  G Codes:      Despina Pole 06/23/2013, 12:35 PM Carita Pian. Sanjuana Kava, [place] Pager [phone]

## 2013-06-24 DIAGNOSIS — E43 Unspecified severe protein-calorie malnutrition: Secondary | ICD-10-CM | POA: Diagnosis not present

## 2013-06-24 DIAGNOSIS — J479 Bronchiectasis, uncomplicated: Secondary | ICD-10-CM | POA: Diagnosis not present

## 2013-06-24 DIAGNOSIS — J9600 Acute respiratory failure, unspecified whether with hypoxia or hypercapnia: Secondary | ICD-10-CM | POA: Diagnosis not present

## 2013-06-24 DIAGNOSIS — J441 Chronic obstructive pulmonary disease with (acute) exacerbation: Secondary | ICD-10-CM | POA: Diagnosis not present

## 2013-06-24 LAB — CBC WITH DIFFERENTIAL/PLATELET
Band Neutrophils: 0 % (ref 0–10)
Basophils Absolute: 0 10*3/uL (ref 0.0–0.1)
Basophils Relative: 0 % (ref 0–1)
Blasts: 0 %
Eosinophils Absolute: 3.5 10*3/uL — ABNORMAL HIGH (ref 0.0–0.7)
Eosinophils Relative: 14 % — ABNORMAL HIGH (ref 0–5)
HCT: 38.3 % (ref 36.0–46.0)
Hemoglobin: 13.1 g/dL (ref 12.0–15.0)
Lymphocytes Relative: 9 % — ABNORMAL LOW (ref 12–46)
Lymphs Abs: 2.2 10*3/uL (ref 0.7–4.0)
MCH: 26.4 pg (ref 26.0–34.0)
MCHC: 34.2 g/dL (ref 30.0–36.0)
MCV: 77.2 fL — ABNORMAL LOW (ref 78.0–100.0)
MYELOCYTES: 0 %
Metamyelocytes Relative: 0 %
Monocytes Absolute: 1 10*3/uL (ref 0.1–1.0)
Monocytes Relative: 4 % (ref 3–12)
NRBC: 0 /100{WBCs}
Neutro Abs: 18.2 10*3/uL — ABNORMAL HIGH (ref 1.7–7.7)
Neutrophils Relative %: 73 % (ref 43–77)
PLATELETS: 258 10*3/uL (ref 150–400)
Promyelocytes Absolute: 0 %
RBC: 4.96 MIL/uL (ref 3.87–5.11)
RDW: 15.9 % — AB (ref 11.5–15.5)
WBC: 24.9 10*3/uL — ABNORMAL HIGH (ref 4.0–10.5)

## 2013-06-24 MED ORDER — PREDNISONE 20 MG PO TABS
20.0000 mg | ORAL_TABLET | Freq: Every day | ORAL | Status: DC
  Administered 2013-06-25: 20 mg via ORAL
  Filled 2013-06-24 (×2): qty 1

## 2013-06-24 NOTE — Care Management Note (Signed)
    Page 1 of 2   06/24/2013     3:55:26 PM CARE MANAGEMENT NOTE 06/24/2013  Patient:  [name], [name]   Account Number:  [id number]  Date Initiated:  06/21/2013  Documentation initiated by:  Marvetta Gibbons  Subjective/Objective Assessment:   Pt admitted with acute resp. failure     Action/Plan:   PTA pt lived at home alone, - NCM to follow for d/c needs   Anticipated DC Date:  06/25/2013   Anticipated DC Plan:  SKILLED NURSING FACILITY  In-house referral  Clinical Social Worker      DC Forensic scientist  CM consult      [facility] Choice  HOME HEALTH   Choice offered to / List presented to:  C-4 Adult Children   DME arranged  [facility]      DME agency  [place] arranged  HH-1 RN  [place]      [place].   Status of service:  Completed, signed off Medicare Important Message given?  YES (If response is "NO", the following Medicare IM given date fields will be blank) Date Medicare IM given:  06/24/2013 Date Additional Medicare IM given:    Discharge Disposition:  [place]  Per UR Regulation:  Reviewed for med. necessity/level of care/duration of stay  If discussed at [place] of Stay Meetings, dates discussed:    Comments:  06/24/13 [place], BSN [id number] patient lives alone, she has refused snf, and CSW is aware, NCM spoke with patient's daughter, she is aware that patient wants to go home alone and daughter does not agree with this but the patient has a right to make her choice. Daughter states she is on her way from [place] right now and will be able to pick patient up tommorrow and coordinate the delivery of the home oxygen with [facility]. Daughter chose [facility] for [facility], Pt, ot ,aide and Education officer, museum.   Soc will begin 24-48 hrs post dc.  Clarisse Gouge will bring oxygen up to patient's room and get delivery to set up at patient's home.   Daughter , [place]  phone is [phone].

## 2013-06-24 NOTE — Progress Notes (Addendum)
OT Cancellation Note  Patient Details Name: [name] MRN: [id number] DOB: 1932-12-21   Cancelled Treatment:      Pt is Medicare and current D/C plan is SNF. No apparent immediate acute care OT needs, therefore will defer OT to SNF. If OT eval is needed please call Acute Rehab Dept. at [phone] or text page OT at [phone].    Almon Register [phone] 06/24/2013, 7:26 AM

## 2013-06-24 NOTE — Progress Notes (Addendum)
PROGRESS NOTE  [name] WNU:[id number] DOB: Jan 23, 1932 DOA: 06/19/2013 PCP: Redge Gainer, MD  Subjective:  80 y.o. year old female with notable history of COPD-not on O2, hyperlipidemia, lymphocytic colitis, remote history of PE, and colonic adenoma who was admitted with acute respiratory failure with hypoxia and generalized weakness. Patient continues to experience weakness. Dyspnea improved but continues to have productive cough.   Assessment/Plan:  Principal Problem:   Acute respiratory failure with hypoxia  Secondary to Emphysema exacerbation  CT chest shows new hilar and mediastinal adenpathy and new extensive interstitial lung disease Schedule 88mo f/u CT via Pulm office   Improved but still drops to 78% ambulating- will require oxygen no further wheezing - tapering steroids - cont nebs-  Planned 14 days of levaquin tx -started 6/10 po Sputum culture shows normal oropharyngeal flora with no acid fast bacilli Patient will need O2 on discharge  Active Problems:  COPD Exacerbation  Significantly improved, initially started on IV Solumedrol,IV Levaquin and nebulized bronchodilators. Now has been transitioned to oral levaquin and tapering prednisone.  Lungs with no wheezing  Generalized weakness Etiology unknown Physical Therapy eval recommends SNF- patient adamantly refuses SNF, family/daughter aware. We'll rediscuss once a daughter gets to town today. Suspect will be discharged home tomorrow with home health services.  Bronchiectasis / New extensive ILD on CT -upper lobe predominant, new hilar & mediastinal adenopathy - need 38mnth FU CT scan -seen by PCCM, plans are for further work up with bronchoscopy as out pt per Pulm   Tobacco abuse  -Counseled on absolute need to stop smoking completely   GERD  -Well controlled with Protonix  Peripheral vascular disease, unspecified  -S/p recent stents to B LE per Dr. Oneida Alar - asymptomatic - exam unrevealing  -c/w ASA    Hyperlipidemia  -Cont Crestor at time of d/c   Protein-calorie malnutrition, severe  -c/w supplements   Deconditioning  -secondary to acute illness  -seen by PT eval-recommeded SNF- see above.   Disposition: SNF recommended by PT.  Patient counseled to go to SNF but adamantly refuses SNF and wants to go home. Patient will be discharged to home on 6/16.  DVT Prophylaxis: Heparin   Procedures:  None  Code Status: Full code  Family Communication Daughter-Kathy-bedside 6/15  CONSULTS:  pulmonary/intensive care  Antibiotics: Anti-infectives   Start     Dose/Rate Route Frequency Ordered Stop   06/25/13 1100  levofloxacin (LEVAQUIN) tablet 750 mg     750 mg Oral Every 48 hours 06/23/13 1202     06/22/13 1630  levofloxacin (LEVAQUIN) tablet 750 mg  Status:  Discontinued     750 mg Oral Daily 06/22/13 1532 06/23/13 1202   06/19/13 1900  levofloxacin (LEVAQUIN) IVPB 750 mg  Status:  Discontinued     750 mg 100 mL/hr over 90 Minutes Intravenous Every 24 hours 06/19/13 1852 06/22/13 1532      Objective: Filed Vitals:   06/23/13 2159 06/24/13 0504 06/24/13 0937 06/24/13 1312  BP: 93/53 130/77  104/48  Pulse: 82 112 103 107  Temp: 97.5 F (36.4 C) 99.3 F (37.4 C)  98.1 F (36.7 C)  TempSrc: Oral Oral  Oral  Resp: 18 18 18 18   Height:      Weight:      SpO2: 94% 92% 92% 87%    Intake/Output Summary (Last 24 hours) at 06/24/13 1334 Last data filed at 06/24/13 0900  Gross per 24 hour  Intake      0 ml  Output  0 ml  Net      0 ml   Filed Weights   06/21/13 0413 06/22/13 1700 06/23/13 0500  Weight: 50.9 kg (112 lb 3.4 oz) 50.168 kg (110 lb 9.6 oz) 50.848 kg (112 lb 1.6 oz)    Exam: General: Cachectic female, NAD, appears stated age  51:  Anicteic Sclera, MMM. No pharyngeal erythema or exudates  Neck: Supple, no masses  Cardiovascular: RRR, S1 S2 auscultated, no rubs, murmurs or gallops.   Respiratory: Clear to auscultation but with poor air movement  bilaterally without wheezing or crackles  Abdomen: Soft, nontender, nondistended, + bowel sounds  Extremities: warm dry without cyanosis or edema.  Neuro: Generalized weakness noted in upper and lower extremities  Psych: Normal affect and demeanor with intact judgement and insight   Data Reviewed: Basic Metabolic Panel:  Recent Labs Lab 06/19/13 1607 06/20/13 0323 06/21/13 0250 06/23/13 0556  NA 136* 136* 138 140  K 4.5 3.9 3.5* 5.4*  CL 94* 94* 97 103  CO2 26 22 29 29   GLUCOSE 83 107* 182* 89  BUN 26* 25* 22 15  CREATININE 0.70 0.62 0.73 0.65  CALCIUM 10.6* 10.0 9.9 9.6   Liver Function Tests:  Recent Labs Lab 06/20/13 0323 06/21/13 0250  AST 19 18  ALT 18 18  ALKPHOS 98 83  BILITOT 0.4 0.2*  PROT 5.6* 5.2*  ALBUMIN 2.3* 2.2*   CBC:  Recent Labs Lab 06/19/13 1607 06/19/13 1945 06/20/13 0323 06/21/13 0250 06/24/13 0615  WBC 23.6* 24.8* 15.5* 29.5* 24.9*  NEUTROABS  --  21.0* 14.1* 26.2* 18.2*  HGB 14.2 13.5 11.9* 10.5* 13.1  HCT 41.0 39.3 35.5* 30.6* 38.3  MCV 77.8* 78.0 78.7 76.5* 77.2*  PLT 228 243 228 254 258   Cardiac Enzymes:  BNP (last 3 results)  Recent Labs  06/19/13 1607  PROBNP 441.5    Recent Results (from the past 240 hour(s))  MRSA PCR SCREENING     Status: None   Collection Time    06/19/13 11:22 PM      Result Value Ref Range Status   MRSA by PCR NEGATIVE  NEGATIVE Final   Comment:            The GeneXpert MRSA Assay (FDA     approved for NASAL specimens     only), is one component of a     comprehensive MRSA colonization     surveillance program. It is not     intended to diagnose MRSA     infection nor to guide or     monitor treatment for     MRSA infections.  CULTURE, EXPECTORATED SPUTUM-ASSESSMENT     Status: None   Collection Time    06/20/13 12:23 PM      Result Value Ref Range Status   Specimen Description SPUTUM   Final   Special Requests NONE   Final   Sputum evaluation     Final   Value: THIS SPECIMEN IS  ACCEPTABLE. RESPIRATORY CULTURE REPORT TO FOLLOW.   Report Status 06/20/2013 FINAL   Final  CULTURE, RESPIRATORY (NON-EXPECTORATED)     Status: None   Collection Time    06/20/13 12:23 PM      Result Value Ref Range Status   Specimen Description SPUTUM   Final   Special Requests NONE   Final   Gram Stain     Final   Value: NO WBC SEEN     RARE SQUAMOUS EPITHELIAL CELLS PRESENT  RARE GRAM POSITIVE COCCI     IN PAIRS RARE GRAM POSITIVE RODS     Performed at [facility]   Culture     Final   Value: NORMAL OROPHARYNGEAL FLORA     Performed at [facility]   Report Status 06/22/2013 FINAL   Final  CULTURE, EXPECTORATED SPUTUM-ASSESSMENT     Status: None   Collection Time    06/22/13  3:26 PM      Result Value Ref Range Status   Specimen Description SPUTUM   Final   Special Requests NONE   Final   Sputum evaluation     Final   Value: THIS SPECIMEN IS ACCEPTABLE. RESPIRATORY CULTURE REPORT TO FOLLOW.   Report Status 06/22/2013 FINAL   Final  AFB CULTURE WITH SMEAR     Status: None   Collection Time    06/22/13  3:26 PM      Result Value Ref Range Status   Specimen Description LUNG   Final   Special Requests NONE   Final   Acid Fast Smear     Final   Value: NO ACID FAST BACILLI SEEN     Performed at [facility]   Culture     Final   Value: CULTURE WILL BE EXAMINED FOR 6 WEEKS BEFORE ISSUING A FINAL REPORT     Performed at [facility]   Report Status PENDING   Incomplete  CULTURE, RESPIRATORY (NON-EXPECTORATED)     Status: None   Collection Time    06/22/13  3:26 PM      Result Value Ref Range Status   Specimen Description SPUTUM   Final   Special Requests NONE   Final   Gram Stain     Final   Value: RARE WBC PRESENT, PREDOMINANTLY PMN     RARE SQUAMOUS EPITHELIAL CELLS PRESENT     RARE GRAM POSITIVE RODS     RARE Y     Performed at [facility]   Culture     Final   Value: NORMAL OROPHARYNGEAL FLORA     Performed at [facility]   Report Status PENDING   Incomplete     Studies:  Scheduled Meds: . ALPRAZolam  0.5 mg Oral BID  . antiseptic oral rinse  15 mL Mouth Rinse BID  . aspirin EC  325 mg Oral Daily  . budesonide-formoterol  2 puff Inhalation BID  . cholecalciferol  2,000 Units Oral Daily  . colestipol  2 g Oral BID  . feeding supplement (ENSURE COMPLETE)  237 mL Oral BID BM  . heparin  5,000 Units Subcutaneous 3 times per day  . ipratropium-albuterol  3 mL Nebulization BID  . [START ON 06/25/2013] levofloxacin  750 mg Oral Q48H  . oxybutynin  5 mg Oral BID  . pantoprazole  40 mg Oral Daily  . predniSONE  20 mg Oral Q breakfast  . raloxifene  60 mg Oral QODAY  . sodium chloride  3 mL Intravenous Q12H   Continuous Infusions:   Principal Problem:   Acute respiratory failure with hypoxia Active Problems:   GERD   Peripheral vascular disease, unspecified   COPD exacerbation   Protein-calorie malnutrition, severe   Sara Reus, PA-S Imogene Burn, PA-S    [facility] Pager [phone]. If 7PM-7AM, please contact night-coverage at [email or web address], password [facility] 06/24/2013, 1:34 PM  LOS: 5 days   Attending Patient was seen, examined,treatment plan was discussed with the Physician extender. I have  directly reviewed the clinical findings, lab, imaging studies and management of this patient in detail. I have made the necessary changes to the above noted documentation, and agree with the documentation, as recorded by the Physician extender.  Nena Alexander MD [facility].

## 2013-06-24 NOTE — Clinical Social Work Psychosocial (Signed)
Clinical Social Work Department BRIEF PSYCHOSOCIAL ASSESSMENT 06/24/2013  Patient:  [name]     Account Number:  [id number]     Admit date:  06/20/2013  Clinical Social Worker:  Lovey Newcomer  Date/Time:  06/24/2013 03:45 PM  Referred by:  Physician  Date Referred:  06/24/2013 Referred for  SNF Placement   Other Referral:   Interview type:  Patient Other interview type:    PSYCHOSOCIAL DATA Living Status:  FAMILY Admitted from facility:   Level of care:   Primary support name:  Teena Primary support relationship to patient:  FRIEND Degree of support available:   Support is fair.    CURRENT CONCERNS Current Concerns  Post-Acute Placement   Other Concerns:    SOCIAL WORK ASSESSMENT / PLAN CSW notified by RN and OT that patient's HPOA has stated that patient needs to go to SNF because patient's son "is and alcoholic" and "there is no AC in the house, the shower doesn't work, and Higher education careers adviser live in the attic." CSW met with patient at bedside to inquire about her interest in SNF placement. Patient refuses SNF placement and states that she wants to go home with her son. CSW informed patient that [facility] has stated that patient's son will not be able to provide 24/7 care for patient, but patient denied this and continues to refuse SNF. CSW asked patient questions to determine patient's orientation. Patient is AAO x4. MD believes that patient has capacity to make this decision. RNCM has requested SW for home visit through [facility] services. CSW emphasized that treatment team believes that the safest option for her would be to go to SNF at discharge, but patient continues to refuse. Throughout assessment patient exhibited flat affect, and although her responses were clear and appropriate, patient's responses to questions were delayed.   Assessment/plan status:  No Further Intervention Required Other assessment/ plan:   NONE   Information/referral to community resources:   NONE     PATIENT'S/FAMILY'S RESPONSE TO PLAN OF CARE: Patient is refusing SNF placement. Patient plans to DC home with son who will pick patient up this evening. No other CSW needs identified. CSW signing off.       Liz Beach MSW, [place], [place], [phone]

## 2013-06-24 NOTE — Progress Notes (Signed)
Patient has no IV. IV found this morning, had come out. MD aware, patient is an expected discharge.

## 2013-06-24 NOTE — Progress Notes (Signed)
SATURATION QUALIFICATIONS: (This note is used to comply with regulatory documentation for home oxygen)  Patient Saturations on Room Air at Rest = 84%  Patient Saturations on Room Air while Ambulating = 78%  Patient Saturations on 3 L is 92%  Please briefly explain why patient needs home oxygen:

## 2013-06-25 ENCOUNTER — Ambulatory Visit: Payer: No Typology Code available for payment source | Admitting: Nurse Practitioner

## 2013-06-25 DIAGNOSIS — J479 Bronchiectasis, uncomplicated: Secondary | ICD-10-CM | POA: Diagnosis not present

## 2013-06-25 DIAGNOSIS — E43 Unspecified severe protein-calorie malnutrition: Secondary | ICD-10-CM | POA: Diagnosis not present

## 2013-06-25 DIAGNOSIS — J441 Chronic obstructive pulmonary disease with (acute) exacerbation: Secondary | ICD-10-CM | POA: Diagnosis not present

## 2013-06-25 DIAGNOSIS — J9600 Acute respiratory failure, unspecified whether with hypoxia or hypercapnia: Secondary | ICD-10-CM | POA: Diagnosis not present

## 2013-06-25 LAB — CBC WITH DIFFERENTIAL/PLATELET
Basophils Absolute: 0 10*3/uL (ref 0.0–0.1)
Basophils Relative: 0 % (ref 0–1)
Eosinophils Absolute: 4.1 10*3/uL — ABNORMAL HIGH (ref 0.0–0.7)
Eosinophils Relative: 15 % — ABNORMAL HIGH (ref 0–5)
HEMATOCRIT: 35.1 % — AB (ref 36.0–46.0)
HEMOGLOBIN: 11.9 g/dL — AB (ref 12.0–15.0)
Lymphocytes Relative: 11 % — ABNORMAL LOW (ref 12–46)
Lymphs Abs: 3 10*3/uL (ref 0.7–4.0)
MCH: 26.4 pg (ref 26.0–34.0)
MCHC: 33.9 g/dL (ref 30.0–36.0)
MCV: 77.8 fL — ABNORMAL LOW (ref 78.0–100.0)
MONOS PCT: 5 % (ref 3–12)
Monocytes Absolute: 1.4 10*3/uL — ABNORMAL HIGH (ref 0.1–1.0)
NEUTROS ABS: 18.8 10*3/uL — AB (ref 1.7–7.7)
Neutrophils Relative %: 69 % (ref 43–77)
Platelets: 274 10*3/uL (ref 150–400)
RBC: 4.51 MIL/uL (ref 3.87–5.11)
RDW: 16.2 % — ABNORMAL HIGH (ref 11.5–15.5)
WBC: 27.3 10*3/uL — ABNORMAL HIGH (ref 4.0–10.5)

## 2013-06-25 LAB — CULTURE, RESPIRATORY: Culture: NORMAL

## 2013-06-25 LAB — CULTURE, RESPIRATORY W GRAM STAIN

## 2013-06-25 MED ORDER — ENSURE COMPLETE PO LIQD: 237.0000 mL | Freq: Two times a day (BID) | ORAL | Status: DC

## 2013-06-25 MED ORDER — LEVOFLOXACIN 750 MG PO TABS: 750.0000 mg | ORAL_TABLET | Freq: Every day | ORAL | Status: DC

## 2013-06-25 MED ORDER — PREDNISONE 10 MG PO TABS: 20.0000 mg | ORAL_TABLET | Freq: Every day | ORAL | Status: DC

## 2013-06-25 MED ORDER — IPRATROPIUM-ALBUTEROL 0.5-2.5 (3) MG/3ML IN SOLN: 3.0000 mL | Freq: Two times a day (BID) | RESPIRATORY_TRACT | Status: DC

## 2013-06-25 NOTE — Progress Notes (Signed)
Patient was discharged home by MD order; discharged instructions  review and give to patient and her daughter with care notes and prescriptions; no complaints at this time. Patient will go home with O2 [address] [place] per MD order;patient will be escorted to the car by volunteer via wheelchair.

## 2013-06-25 NOTE — Discharge Summary (Signed)
Physician Discharge Summary  [name] XLK:[id number] DOB: [birth year] DOA: 06/19/2013  PCP: Redge Gainer, MD  Admit date: 06/19/2013 Discharge date: 06/25/2013  Time spent: 40 minutes  Recommendations for Outpatient Follow-up:  1. Follow up CBC in 3-5 days.  If WBC remains persistently elevated please consider hematology referral. 2. Full [place] (RN, PT, OT, Respiratory, Aid, Social worker).  As patient politely refuses SNF rehab.  3. 48mnth FU CT Chest with Pulmonologist 4. Use oxygen at home   Discharge Diagnoses:  Principal Problem:   Acute respiratory failure with hypoxia Active Problems:   GERD   Peripheral vascular disease, unspecified   COPD exacerbation   Protein-calorie malnutrition, severe   Discharge Condition: Stable  Diet recommendation: Heart Healthy    Filed Weights   06/22/13 1700 06/23/13 0500 06/25/13 0514  Weight: 50.168 kg (110 lb 9.6 oz) 50.848 kg (112 lb 1.6 oz) 48.399 kg (106 lb 11.2 oz)    History of present illness:  80 y.o. year old female with notable history of COPD-not on O2, hyperlipidemia, lymphocytic colitis, remote history of PE, and colonic adenoma who was admitted with acute respiratory failure with hypoxia and generalized weakness. Dyspnea has improved but continues to have a productive cough and experience weakness. Patient denies nausea, vomiting, diarrhea, chest pain, abdominal pain.    Principal Problem:  Acute respiratory failure with hypoxia  Secondary to Emphysema exacerbation  CT chest shows new hilar and mediastinal adenpathy and new extensive interstitial lung disease  Schedule 79mo f/u CT Pulmonary follow up scheduled for 07/03/13 Improved but still drops to 78% ambulating (6/15)- will require oxygen  No further wheezing - tapering steroids - cont nebs- spiriva. Planned 14 days of levaquin tx -started 6/10 po  Sputum culture shows normal oropharyngeal flora with no acid fast bacilli  Stable at discharge -  Patient discharged with O2 Appreciate inpatient pulmonary consultation. Patient requesting discharge to home.  Active Problems:  COPD Exacerbation  Significantly improved, initially started on IV Solumedrol,IV Levaquin and nebulized bronchodilators.  Now has been transitioned to oral levaquin and tapering prednisone.  Lungs with no wheezing.  Please see above.  Generalized weakness  Etiology unknown  Physical Therapy eval recommends SNF- patient refuses SNF, family/daughter aware.Life threatening/disabling risks of catastrophic fall explained to patient and daughter. Discharged home with home health services.   Bronchiectasis / New extensive ILD on CT  -upper lobe predominant, new hilar & mediastinal adenopathy - need 46mnth FU CT scan -seen by PCCM, plans are for further work up with bronchoscopy as out pt per Pulm   Leukcytosis Uncertain etiology.  On prednisone. No dysuria.  No clinically acute infection. Already on antibiotics. Recommend checking CBC in outpatient follow up. Patient requesting discharge to home.  Tobacco abuse  -Counseled on absolute need to stop smoking completely   GERD  -Well controlled with Protonix   Peripheral vascular disease, unspecified  -S/p recent stents to B LE per Dr. Oneida Alar - asymptomatic - exam unrevealing  -c/w ASA   Hyperlipidemia  -Cont Crestor at time of d/c   Protein-calorie malnutrition, severe  -c/w supplements   Deconditioning  -secondary to acute illness  -seen by PT eval-recommeded SNF- see above.   Procedures:  None    CONSULTS:  Pulmonary/intensive care   Discharge Exam: Filed Vitals:   06/25/13 0514  BP: 112/62  Pulse: 103  Temp: 100.6 F (38.1 C)  Resp: 18      Exam:  General: Cachectic female, NAD HEENT: Anicteic Sclera, MMM.  No pharyngeal erythema or exudates  Neck: Supple, no masses, no JVP Cardiovascular: Regular rate, no murmurs, rubs or gallops.  Respiratory: Poor air movement bilaterally  without wheezing or crackles  Abdomen: Soft, nondistended, nontender, + bowel sounds  Extremities: warm dry without cyanosis or edema.  Neuro: Generalized weakness noted in upper and lower extremities  Psych: Normal affect and demeanor with intact judgement and insight   Discharge Instructions    Medication List    STOP taking these medications       minocycline 100 MG capsule  Commonly known as:  MINOCIN,DYNACIN      TAKE these medications       albuterol 108 (90 BASE) MCG/ACT inhaler  Commonly known as:  PROVENTIL HFA;VENTOLIN HFA  Inhale 2 puffs into the lungs 2 (two) times daily as needed for wheezing or shortness of breath.     ALPRAZolam 0.5 MG tablet  Commonly known as:  XANAX  Take 0.5 mg by mouth 2 (two) times daily.     aspirin EC 325 MG tablet  Take 325 mg by mouth daily.     budesonide-formoterol 160-4.5 MCG/ACT inhaler  Commonly known as:  SYMBICORT  Inhale 2 puffs into the lungs 2 (two) times daily.     colestipol 1 G tablet  Commonly known as:  COLESTID  Take 2 g by mouth See admin instructions. Takes once or twice a day depending on bowel movements     CRANBERRY PO  Take 1 tablet by mouth daily.     feeding supplement (ENSURE COMPLETE) Liqd  Take 237 mLs by mouth 2 (two) times daily between meals.     ipratropium-albuterol 0.5-2.5 (3) MG/3ML Soln  Commonly known as:  DUONEB  Take 3 mLs by nebulization 2 (two) times daily. And take every two hours if needed for shortness of breath.     levofloxacin 750 MG tablet  Commonly known as:  LEVAQUIN  Take 1 tablet (750 mg total) by mouth daily.     MUCINEX MAXIMUM STRENGTH 1200 MG Tb12  Generic drug:  Guaifenesin  Take 1,200 mg by mouth every morning.     guaiFENesin 600 MG 12 hr tablet  Commonly known as:  MUCINEX  Take 600 mg by mouth every evening.     oxybutynin 5 MG tablet  Commonly known as:  DITROPAN  Take 1 tablet (5 mg total) by mouth 2 (two) times daily.     pantoprazole 40 MG tablet   Commonly known as:  PROTONIX  Take 1 tablet (40 mg total) by mouth daily.     predniSONE 10 MG tablet  Commonly known as:  DELTASONE  Take 2 tablets (20 mg total) by mouth daily with breakfast. Take 2 tablet at breakfast for 3 days.  Then take one tablet daily until gone.     raloxifene 60 MG tablet  Commonly known as:  EVISTA  Take 1 tablet (60 mg total) by mouth every other day.     rosuvastatin 10 MG tablet  Commonly known as:  CRESTOR  Take 1 tablet (10 mg total) by mouth daily.     SYSTANE OP  Place 1 drop into both eyes daily.     triamterene-hydrochlorothiazide 37.5-25 MG per capsule  Commonly known as:  DYAZIDE  Take 1 each (1 capsule total) by mouth daily.     Vitamin D 2000 UNITS tablet  Take 2,000 Units by mouth daily.     vitamin E 400 UNIT capsule  Take 400 Units by mouth  daily.       Allergies  Allergen Reactions  . Codeine Anaphylaxis  . Penicillins Anaphylaxis  . Sulfa Antibiotics Anaphylaxis       Follow-up Information   Follow up with PARRETT,TAMMY, NP On 07/03/2013. (11-30am)    Specialty:  Nurse Practitioner   Contact information:   [place] [place] [address] [phone]        The results of significant diagnostics from this hospitalization (including imaging, microbiology, ancillary and laboratory) are listed below for reference.    Significant Diagnostic Studies: Ct Chest Wo Contrast  06/19/2013   CLINICAL DATA:  Shortness of breath. Abnormal radiograph demonstrating extensive infiltrates.  EXAM: CT CHEST WITHOUT CONTRAST  TECHNIQUE: Multidetector CT imaging of the chest was performed following the standard protocol without IV contrast.  COMPARISON:  CT scan dated 11/10/2008  FINDINGS: The patient has developed hilar and mediastinal adenopathy since the prior exam. Subcarinal lymph node measures 2.6 cm AP dimension on image 33. Precarinal lymph nodes measure 11 mm and 15 mm on images 23 and 26 respectively. There is also new  adenopathy in the aortopulmonary window.  The patient has developed severe interstitial lung disease superimposed on pre-existing emphysema since the prior 11/10/2008. The patient has also developed bronchiectasis in the left lower lobe with areas of mucous plugging and atelectasis at the left base. There is also progressive bronchiectasis at the right lung base.  Heart size is normal. No effusions. No acute osseous abnormality. Images through the upper abdomen demonstrate the 3 cm hepatic cyst in the anterior aspect of the dome of the left lobe of the liver, increased from 1.8 cm on 11/10/2008.  IMPRESSION: 1. New extensive interstitial lung disease primarily in the upper lobes with progressive bronchiectasis at the right base and new extensive bronchiectasis at the left base. 2. New hilar and mediastinal adenopathy. 3. The possibility of sarcoidosis should be considered. The patient has had previous resection of of a portion of the posterior aspect of the left seventh rib. Has the patient had a prior lung biopsy?   Electronically Signed   By: Rozetta Nunnery M.D.   On: 06/19/2013 17:46   Dg Chest Port 1 View  06/19/2013   CLINICAL DATA:  Shortness of breath  EXAM: PORTABLE CHEST - 1 VIEW  COMPARISON:  12/16/2005  FINDINGS: Cardiac shadow is within normal limits. Diffuse interstitial and alveolar changes are noted throughout both lungs. This may all be chronic in nature although this is a significant interval change when compared with the prior exam of 8 years previous. Acute infiltrate cannot be totally excluded. Slight increase consolidation is noted in the left retrocardiac region. CT may be helpful for further evaluation.  IMPRESSION: Diffuse changes through both lungs which may be related to chronic fibrosis although the possibility of superimposed infection cannot be totally excluded. CT may be helpful for further evaluation.   Electronically Signed   By: Inez Catalina M.D.   On: 06/19/2013 16:21   Ct  Maxillofacial Wo Cm  06/19/2013   CLINICAL DATA:  Fall.  Right facial injury.  EXAM: CT MAXILLOFACIAL WITHOUT CONTRAST  TECHNIQUE: Multidetector CT imaging of the maxillofacial structures was performed. Multiplanar CT image reconstructions were also generated. A small metallic BB was placed on the right temple in order to reliably differentiate right from left.  COMPARISON:  CT head 11/18/2004  FINDINGS: Atrophy and ventriculomegaly is unchanged.  Negative for facial fracture. No fracture of the orbit or mandible. No air-fluid level in  the paranasal sinuses. Mild mucosal thickening in the paranasal sinuses. Cervical spondylosis.  IMPRESSION: Negative for facial fracture.   Electronically Signed   By: Franchot Gallo M.D.   On: 06/19/2013 17:37    Microbiology: Recent Results (from the past 240 hour(s))  MRSA PCR SCREENING     Status: None   Collection Time    06/19/13 11:22 PM      Result Value Ref Range Status   MRSA by PCR NEGATIVE  NEGATIVE Final   Comment:            The GeneXpert MRSA Assay (FDA     approved for NASAL specimens     only), is one component of a     comprehensive MRSA colonization     surveillance program. It is not     intended to diagnose MRSA     infection nor to guide or     monitor treatment for     MRSA infections.  CULTURE, EXPECTORATED SPUTUM-ASSESSMENT     Status: None   Collection Time    06/20/13 12:23 PM      Result Value Ref Range Status   Specimen Description SPUTUM   Final   Special Requests NONE   Final   Sputum evaluation     Final   Value: THIS SPECIMEN IS ACCEPTABLE. RESPIRATORY CULTURE REPORT TO FOLLOW.   Report Status 06/20/2013 FINAL   Final  CULTURE, RESPIRATORY (NON-EXPECTORATED)     Status: None   Collection Time    06/20/13 12:23 PM      Result Value Ref Range Status   Specimen Description SPUTUM   Final   Special Requests NONE   Final   Gram Stain     Final   Value: NO WBC SEEN     RARE SQUAMOUS EPITHELIAL CELLS PRESENT     RARE  GRAM POSITIVE COCCI     IN PAIRS RARE GRAM POSITIVE RODS     Performed at [facility]   Culture     Final   Value: NORMAL OROPHARYNGEAL FLORA     Performed at [facility]   Report Status 06/22/2013 FINAL   Final  CULTURE, EXPECTORATED SPUTUM-ASSESSMENT     Status: None   Collection Time    06/22/13  3:26 PM      Result Value Ref Range Status   Specimen Description SPUTUM   Final   Special Requests NONE   Final   Sputum evaluation     Final   Value: THIS SPECIMEN IS ACCEPTABLE. RESPIRATORY CULTURE REPORT TO FOLLOW.   Report Status 06/22/2013 FINAL   Final  AFB CULTURE WITH SMEAR     Status: None   Collection Time    06/22/13  3:26 PM      Result Value Ref Range Status   Specimen Description LUNG   Final   Special Requests NONE   Final   Acid Fast Smear     Final   Value: NO ACID FAST BACILLI SEEN     Performed at [facility]   Culture     Final   Value: CULTURE WILL BE EXAMINED FOR 6 WEEKS BEFORE ISSUING A FINAL REPORT     Performed at [facility]   Report Status PENDING   Incomplete  CULTURE, RESPIRATORY (NON-EXPECTORATED)     Status: None   Collection Time    06/22/13  3:26 PM      Result Value Ref Range Status   Specimen Description  SPUTUM   Final   Special Requests NONE   Final   Gram Stain     Final   Value: RARE WBC PRESENT, PREDOMINANTLY PMN     RARE SQUAMOUS EPITHELIAL CELLS PRESENT     RARE GRAM POSITIVE RODS     RARE Y     Performed at [facility]   Culture     Final   Value: NORMAL OROPHARYNGEAL FLORA     Performed at [facility]   Report Status PENDING   Incomplete     Labs: Basic Metabolic Panel:  Recent Labs Lab 06/19/13 1607 06/20/13 0323 06/21/13 0250 06/23/13 0556  NA 136* 136* 138 140  K 4.5 3.9 3.5* 5.4*  CL 94* 94* 97 103  CO2 26 22 29 29   GLUCOSE 83 107* 182* 89  BUN 26* 25* 22 15  CREATININE 0.70 0.62 0.73 0.65  CALCIUM 10.6* 10.0 9.9 9.6   Liver Function Tests:  Recent  Labs Lab 06/20/13 0323 06/21/13 0250  AST 19 18  ALT 18 18  ALKPHOS 98 83  BILITOT 0.4 0.2*  PROT 5.6* 5.2*  ALBUMIN 2.3* 2.2*    CBC:  Recent Labs CBC Latest Ref Rng 06/25/2013 06/24/2013 06/21/2013  WBC 4.0 - 10.5 K/uL 27.3(H) 24.9(H) 29.5(H)  Hemoglobin 12.0 - 15.0 g/dL 11.9(L) 13.1 10.5(L)  Hematocrit 36.0 - 46.0 % 35.1(L) 38.3 30.6(L)  Platelets 150 - 400 K/uL 274 258 254     BNP: BNP (last 3 results)  Recent Labs  06/19/13 1607  PROBNP 441.5      Signed:  Ferne Reus, PA-S  Imogene Burn, PA-C [phone] [facility] 06/25/2013, 8:53 AM   Attending Patient was seen, examined,treatment plan was discussed with the Physician extender. I have directly reviewed the clinical findings, lab, imaging studies and management of this patient in detail. I have made the necessary changes to the above noted documentation, and agree with the documentation, as recorded by the Physician extender.  Nena Alexander MD [facility].

## 2013-06-25 NOTE — Discharge Instructions (Signed)
Follow up with both your primary care physician to have your CBC checked in 3-5 days, as well as the pulmonary doctor.

## 2013-06-25 NOTE — Progress Notes (Signed)
Physical Therapy Treatment Patient Details Name: [name] MRN: [id number] DOB: Mar 21, 1932 Today's Date: 2013-07-24    History of Present Illness Admitted with general weakness, dyspnea and cough over last few days before admission.  Admitting dx of respiratory failure with hypoxia.    PT Comments    Pt progressing with gait and mobility but not yet to an independent level for safe DC home however pt states she is refusing SNF. Continue to recommend SNF and assist for mobility with pt notified and states neighbor will assist. Pt encouraged to continue mobility and educated for bil LE HEP. Sats 88-96% on 2L with gait.  Follow Up Recommendations  SNF     Equipment Recommendations       Recommendations for Other Services       Precautions / Restrictions Precautions Precautions: Fall Precaution Comments: distracted Restrictions Weight Bearing Restrictions: No    Mobility  Bed Mobility Overal bed mobility: Needs Assistance       Supine to sit: Supervision     General bed mobility comments: cues for sequence with increased time  Transfers Overall transfer level: Needs assistance   Transfers: Sit to/from Stand Sit to Stand: Min assist         General transfer comment: cues for hand placement with minguard from bed and min assist from toilet with rail  Ambulation/Gait Ambulation/Gait assistance: Min guard Ambulation Distance (Feet): 300 Feet Assistive device: Rolling walker (2 wheeled) Gait Pattern/deviations: Step-through pattern;Decreased stride length;Trunk flexed   Gait velocity interpretation: Below normal speed for age/gender General Gait Details: mod cues to step into RW and avoid obstacles pt tends to run into things on the right   Stairs Stairs: Yes Stairs assistance: Supervision Stair Management: Two rails;Step to pattern;Forwards Number of Stairs: 2    Wheelchair Mobility    Modified Rankin (Stroke Patients Only)       Balance                                    Cognition Arousal/Alertness: Awake/alert Behavior During Therapy: WFL for tasks assessed/performed Overall Cognitive Status: Impaired/Different from baseline Area of Impairment: Safety/judgement         Safety/Judgement: Decreased awareness of safety;Decreased awareness of deficits          Exercises      General Comments        Pertinent Vitals/Pain No pain    Home Living                      Prior Function            PT Goals (current goals can now be found in the care plan section) Progress towards PT goals: Progressing toward goals    Frequency  Min 3X/week    PT Plan Current plan remains appropriate    Co-evaluation             End of Session Equipment Utilized During Treatment: Oxygen Activity Tolerance: Patient tolerated treatment well Patient left: in chair;with call bell/phone within reach     Time: 3716-9678 PT Time Calculation (min): 38 min  Charges:  $Gait Training: 23-37 mins $Therapeutic Activity: 8-22 mins                    G Codes:      Melford Aase 07/24/2013, 10:25 AM Elwyn Reach, [place]

## 2013-06-27 ENCOUNTER — Telehealth: Payer: Self-pay | Admitting: *Deleted

## 2013-06-27 DIAGNOSIS — J471 Bronchiectasis with (acute) exacerbation: Secondary | ICD-10-CM | POA: Diagnosis not present

## 2013-06-27 DIAGNOSIS — L89109 Pressure ulcer of unspecified part of back, unspecified stage: Secondary | ICD-10-CM | POA: Diagnosis not present

## 2013-06-27 DIAGNOSIS — F17200 Nicotine dependence, unspecified, uncomplicated: Secondary | ICD-10-CM | POA: Diagnosis not present

## 2013-06-27 DIAGNOSIS — I739 Peripheral vascular disease, unspecified: Secondary | ICD-10-CM | POA: Diagnosis not present

## 2013-06-27 DIAGNOSIS — L8992 Pressure ulcer of unspecified site, stage 2: Secondary | ICD-10-CM | POA: Diagnosis not present

## 2013-06-27 DIAGNOSIS — E43 Unspecified severe protein-calorie malnutrition: Secondary | ICD-10-CM | POA: Diagnosis not present

## 2013-06-27 DIAGNOSIS — J441 Chronic obstructive pulmonary disease with (acute) exacerbation: Secondary | ICD-10-CM | POA: Diagnosis not present

## 2013-06-27 NOTE — Telephone Encounter (Signed)
Pt came home from [place] 06/25/13, notes recommend CBC in a few days. Dr. Laurance Flatten gave verbal order for CBC to be drawn 05/28/13. Called Traci and gave order

## 2013-06-28 DIAGNOSIS — J479 Bronchiectasis, uncomplicated: Secondary | ICD-10-CM | POA: Diagnosis not present

## 2013-06-28 DIAGNOSIS — J471 Bronchiectasis with (acute) exacerbation: Secondary | ICD-10-CM | POA: Diagnosis not present

## 2013-06-28 DIAGNOSIS — J441 Chronic obstructive pulmonary disease with (acute) exacerbation: Secondary | ICD-10-CM | POA: Diagnosis not present

## 2013-06-28 DIAGNOSIS — E43 Unspecified severe protein-calorie malnutrition: Secondary | ICD-10-CM | POA: Diagnosis not present

## 2013-06-28 DIAGNOSIS — J9600 Acute respiratory failure, unspecified whether with hypoxia or hypercapnia: Secondary | ICD-10-CM | POA: Diagnosis not present

## 2013-06-28 DIAGNOSIS — L89109 Pressure ulcer of unspecified part of back, unspecified stage: Secondary | ICD-10-CM | POA: Diagnosis not present

## 2013-06-28 DIAGNOSIS — L8992 Pressure ulcer of unspecified site, stage 2: Secondary | ICD-10-CM | POA: Diagnosis not present

## 2013-06-28 DIAGNOSIS — I739 Peripheral vascular disease, unspecified: Secondary | ICD-10-CM | POA: Diagnosis not present

## 2013-07-02 DIAGNOSIS — L8992 Pressure ulcer of unspecified site, stage 2: Secondary | ICD-10-CM | POA: Diagnosis not present

## 2013-07-02 DIAGNOSIS — I739 Peripheral vascular disease, unspecified: Secondary | ICD-10-CM | POA: Diagnosis not present

## 2013-07-02 DIAGNOSIS — J471 Bronchiectasis with (acute) exacerbation: Secondary | ICD-10-CM | POA: Diagnosis not present

## 2013-07-02 DIAGNOSIS — L89109 Pressure ulcer of unspecified part of back, unspecified stage: Secondary | ICD-10-CM | POA: Diagnosis not present

## 2013-07-02 DIAGNOSIS — J441 Chronic obstructive pulmonary disease with (acute) exacerbation: Secondary | ICD-10-CM | POA: Diagnosis not present

## 2013-07-02 DIAGNOSIS — E43 Unspecified severe protein-calorie malnutrition: Secondary | ICD-10-CM | POA: Diagnosis not present

## 2013-07-03 ENCOUNTER — Inpatient Hospital Stay: Payer: Medicare Other | Admitting: Adult Health

## 2013-07-04 DIAGNOSIS — J441 Chronic obstructive pulmonary disease with (acute) exacerbation: Secondary | ICD-10-CM | POA: Diagnosis not present

## 2013-07-04 DIAGNOSIS — E43 Unspecified severe protein-calorie malnutrition: Secondary | ICD-10-CM | POA: Diagnosis not present

## 2013-07-04 DIAGNOSIS — I739 Peripheral vascular disease, unspecified: Secondary | ICD-10-CM | POA: Diagnosis not present

## 2013-07-04 DIAGNOSIS — J471 Bronchiectasis with (acute) exacerbation: Secondary | ICD-10-CM | POA: Diagnosis not present

## 2013-07-04 DIAGNOSIS — L89109 Pressure ulcer of unspecified part of back, unspecified stage: Secondary | ICD-10-CM | POA: Diagnosis not present

## 2013-07-04 DIAGNOSIS — L8992 Pressure ulcer of unspecified site, stage 2: Secondary | ICD-10-CM | POA: Diagnosis not present

## 2013-07-05 DIAGNOSIS — J471 Bronchiectasis with (acute) exacerbation: Secondary | ICD-10-CM | POA: Diagnosis not present

## 2013-07-05 DIAGNOSIS — J441 Chronic obstructive pulmonary disease with (acute) exacerbation: Secondary | ICD-10-CM | POA: Diagnosis not present

## 2013-07-05 DIAGNOSIS — L89109 Pressure ulcer of unspecified part of back, unspecified stage: Secondary | ICD-10-CM | POA: Diagnosis not present

## 2013-07-05 DIAGNOSIS — I739 Peripheral vascular disease, unspecified: Secondary | ICD-10-CM | POA: Diagnosis not present

## 2013-07-05 DIAGNOSIS — E43 Unspecified severe protein-calorie malnutrition: Secondary | ICD-10-CM | POA: Diagnosis not present

## 2013-07-05 DIAGNOSIS — L8992 Pressure ulcer of unspecified site, stage 2: Secondary | ICD-10-CM | POA: Diagnosis not present

## 2013-07-08 ENCOUNTER — Telehealth: Payer: Self-pay | Admitting: Nurse Practitioner

## 2013-07-08 DIAGNOSIS — E43 Unspecified severe protein-calorie malnutrition: Secondary | ICD-10-CM | POA: Diagnosis not present

## 2013-07-08 DIAGNOSIS — L8992 Pressure ulcer of unspecified site, stage 2: Secondary | ICD-10-CM | POA: Diagnosis not present

## 2013-07-08 DIAGNOSIS — J441 Chronic obstructive pulmonary disease with (acute) exacerbation: Secondary | ICD-10-CM | POA: Diagnosis not present

## 2013-07-08 DIAGNOSIS — J471 Bronchiectasis with (acute) exacerbation: Secondary | ICD-10-CM | POA: Diagnosis not present

## 2013-07-08 DIAGNOSIS — I739 Peripheral vascular disease, unspecified: Secondary | ICD-10-CM | POA: Diagnosis not present

## 2013-07-08 DIAGNOSIS — L89109 Pressure ulcer of unspecified part of back, unspecified stage: Secondary | ICD-10-CM | POA: Diagnosis not present

## 2013-07-08 NOTE — Telephone Encounter (Signed)
NTBS to discuss

## 2013-07-09 NOTE — Telephone Encounter (Signed)
Spoke with pt's daughter Tye Maryland regarding pt's pain Tye Maryland stated her mom is having some constipation She has given the pt mag citrate and miralax with no results Also has tried an enema Will take pt to ED if no improvement

## 2013-07-10 DIAGNOSIS — E43 Unspecified severe protein-calorie malnutrition: Secondary | ICD-10-CM | POA: Diagnosis not present

## 2013-07-10 DIAGNOSIS — J441 Chronic obstructive pulmonary disease with (acute) exacerbation: Secondary | ICD-10-CM | POA: Diagnosis not present

## 2013-07-10 DIAGNOSIS — L89109 Pressure ulcer of unspecified part of back, unspecified stage: Secondary | ICD-10-CM | POA: Diagnosis not present

## 2013-07-10 DIAGNOSIS — L8992 Pressure ulcer of unspecified site, stage 2: Secondary | ICD-10-CM | POA: Diagnosis not present

## 2013-07-10 DIAGNOSIS — I739 Peripheral vascular disease, unspecified: Secondary | ICD-10-CM | POA: Diagnosis not present

## 2013-07-10 DIAGNOSIS — J471 Bronchiectasis with (acute) exacerbation: Secondary | ICD-10-CM | POA: Diagnosis not present

## 2013-07-12 DIAGNOSIS — L89109 Pressure ulcer of unspecified part of back, unspecified stage: Secondary | ICD-10-CM | POA: Diagnosis not present

## 2013-07-12 DIAGNOSIS — J471 Bronchiectasis with (acute) exacerbation: Secondary | ICD-10-CM | POA: Diagnosis not present

## 2013-07-12 DIAGNOSIS — J441 Chronic obstructive pulmonary disease with (acute) exacerbation: Secondary | ICD-10-CM | POA: Diagnosis not present

## 2013-07-12 DIAGNOSIS — I739 Peripheral vascular disease, unspecified: Secondary | ICD-10-CM | POA: Diagnosis not present

## 2013-07-12 DIAGNOSIS — L8992 Pressure ulcer of unspecified site, stage 2: Secondary | ICD-10-CM | POA: Diagnosis not present

## 2013-07-12 DIAGNOSIS — E43 Unspecified severe protein-calorie malnutrition: Secondary | ICD-10-CM | POA: Diagnosis not present

## 2013-07-15 DIAGNOSIS — J471 Bronchiectasis with (acute) exacerbation: Secondary | ICD-10-CM | POA: Diagnosis not present

## 2013-07-15 DIAGNOSIS — J441 Chronic obstructive pulmonary disease with (acute) exacerbation: Secondary | ICD-10-CM | POA: Diagnosis not present

## 2013-07-15 DIAGNOSIS — L89109 Pressure ulcer of unspecified part of back, unspecified stage: Secondary | ICD-10-CM | POA: Diagnosis not present

## 2013-07-15 DIAGNOSIS — I739 Peripheral vascular disease, unspecified: Secondary | ICD-10-CM | POA: Diagnosis not present

## 2013-07-15 DIAGNOSIS — E43 Unspecified severe protein-calorie malnutrition: Secondary | ICD-10-CM | POA: Diagnosis not present

## 2013-07-15 DIAGNOSIS — L8992 Pressure ulcer of unspecified site, stage 2: Secondary | ICD-10-CM | POA: Diagnosis not present

## 2013-07-17 DIAGNOSIS — I739 Peripheral vascular disease, unspecified: Secondary | ICD-10-CM | POA: Diagnosis not present

## 2013-07-17 DIAGNOSIS — J471 Bronchiectasis with (acute) exacerbation: Secondary | ICD-10-CM | POA: Diagnosis not present

## 2013-07-17 DIAGNOSIS — L89109 Pressure ulcer of unspecified part of back, unspecified stage: Secondary | ICD-10-CM | POA: Diagnosis not present

## 2013-07-17 DIAGNOSIS — J441 Chronic obstructive pulmonary disease with (acute) exacerbation: Secondary | ICD-10-CM | POA: Diagnosis not present

## 2013-07-17 DIAGNOSIS — E43 Unspecified severe protein-calorie malnutrition: Secondary | ICD-10-CM | POA: Diagnosis not present

## 2013-07-17 DIAGNOSIS — L8992 Pressure ulcer of unspecified site, stage 2: Secondary | ICD-10-CM | POA: Diagnosis not present

## 2013-07-18 ENCOUNTER — Other Ambulatory Visit: Payer: Self-pay | Admitting: Nurse Practitioner

## 2013-07-18 DIAGNOSIS — R531 Weakness: Secondary | ICD-10-CM

## 2013-07-19 DIAGNOSIS — L89109 Pressure ulcer of unspecified part of back, unspecified stage: Secondary | ICD-10-CM | POA: Diagnosis not present

## 2013-07-19 DIAGNOSIS — L8992 Pressure ulcer of unspecified site, stage 2: Secondary | ICD-10-CM | POA: Diagnosis not present

## 2013-07-19 DIAGNOSIS — J441 Chronic obstructive pulmonary disease with (acute) exacerbation: Secondary | ICD-10-CM | POA: Diagnosis not present

## 2013-07-19 DIAGNOSIS — E43 Unspecified severe protein-calorie malnutrition: Secondary | ICD-10-CM | POA: Diagnosis not present

## 2013-07-19 DIAGNOSIS — J471 Bronchiectasis with (acute) exacerbation: Secondary | ICD-10-CM | POA: Diagnosis not present

## 2013-07-19 DIAGNOSIS — I739 Peripheral vascular disease, unspecified: Secondary | ICD-10-CM | POA: Diagnosis not present

## 2013-07-23 DIAGNOSIS — J471 Bronchiectasis with (acute) exacerbation: Secondary | ICD-10-CM | POA: Diagnosis not present

## 2013-07-23 DIAGNOSIS — E43 Unspecified severe protein-calorie malnutrition: Secondary | ICD-10-CM | POA: Diagnosis not present

## 2013-07-23 DIAGNOSIS — L8992 Pressure ulcer of unspecified site, stage 2: Secondary | ICD-10-CM | POA: Diagnosis not present

## 2013-07-23 DIAGNOSIS — I739 Peripheral vascular disease, unspecified: Secondary | ICD-10-CM | POA: Diagnosis not present

## 2013-07-23 DIAGNOSIS — J441 Chronic obstructive pulmonary disease with (acute) exacerbation: Secondary | ICD-10-CM | POA: Diagnosis not present

## 2013-07-23 DIAGNOSIS — L89109 Pressure ulcer of unspecified part of back, unspecified stage: Secondary | ICD-10-CM | POA: Diagnosis not present

## 2013-07-24 DIAGNOSIS — L8992 Pressure ulcer of unspecified site, stage 2: Secondary | ICD-10-CM | POA: Diagnosis not present

## 2013-07-24 DIAGNOSIS — J471 Bronchiectasis with (acute) exacerbation: Secondary | ICD-10-CM | POA: Diagnosis not present

## 2013-07-24 DIAGNOSIS — L89109 Pressure ulcer of unspecified part of back, unspecified stage: Secondary | ICD-10-CM | POA: Diagnosis not present

## 2013-07-24 DIAGNOSIS — J441 Chronic obstructive pulmonary disease with (acute) exacerbation: Secondary | ICD-10-CM | POA: Diagnosis not present

## 2013-07-24 DIAGNOSIS — E43 Unspecified severe protein-calorie malnutrition: Secondary | ICD-10-CM | POA: Diagnosis not present

## 2013-07-24 DIAGNOSIS — I739 Peripheral vascular disease, unspecified: Secondary | ICD-10-CM | POA: Diagnosis not present

## 2013-07-25 ENCOUNTER — Ambulatory Visit (INDEPENDENT_AMBULATORY_CARE_PROVIDER_SITE_OTHER): Payer: Medicare Other | Admitting: Adult Health

## 2013-07-25 ENCOUNTER — Encounter: Payer: Self-pay | Admitting: Adult Health

## 2013-07-25 VITALS — BP 104/56 | HR 74 | Temp 97.4°F | Ht 64.5 in | Wt 103.0 lb

## 2013-07-25 DIAGNOSIS — IMO0001 Reserved for inherently not codable concepts without codable children: Secondary | ICD-10-CM

## 2013-07-25 DIAGNOSIS — J449 Chronic obstructive pulmonary disease, unspecified: Secondary | ICD-10-CM

## 2013-07-25 DIAGNOSIS — J441 Chronic obstructive pulmonary disease with (acute) exacerbation: Secondary | ICD-10-CM

## 2013-07-25 DIAGNOSIS — J471 Bronchiectasis with (acute) exacerbation: Secondary | ICD-10-CM | POA: Diagnosis not present

## 2013-07-25 MED ORDER — ALBUTEROL SULFATE HFA 108 (90 BASE) MCG/ACT IN AERS: 2.0000 | INHALATION_SPRAY | RESPIRATORY_TRACT | Status: DC | PRN

## 2013-07-25 MED ORDER — BUDESONIDE-FORMOTEROL FUMARATE 160-4.5 MCG/ACT IN AERO: 2.0000 | INHALATION_SPRAY | Freq: Two times a day (BID) | RESPIRATORY_TRACT | Status: DC

## 2013-07-25 NOTE — Patient Instructions (Signed)
Begin Symbicort 160/4.91mcg 2 puffs Twice daily  , rinse after use.  May use Albuterol Inhaler 2 puffs every 4hr as needed.  Follow up with Dr. Elsworth Soho  Or Available doctor with 4-6 weeks with PFT.  Follow up with primary doctor next week as planned for follow up and labs

## 2013-07-26 ENCOUNTER — Other Ambulatory Visit: Payer: Self-pay | Admitting: Nurse Practitioner

## 2013-07-27 DIAGNOSIS — I739 Peripheral vascular disease, unspecified: Secondary | ICD-10-CM | POA: Diagnosis not present

## 2013-07-27 DIAGNOSIS — J471 Bronchiectasis with (acute) exacerbation: Secondary | ICD-10-CM | POA: Diagnosis not present

## 2013-07-27 DIAGNOSIS — J441 Chronic obstructive pulmonary disease with (acute) exacerbation: Secondary | ICD-10-CM | POA: Diagnosis not present

## 2013-07-27 DIAGNOSIS — E43 Unspecified severe protein-calorie malnutrition: Secondary | ICD-10-CM | POA: Diagnosis not present

## 2013-07-27 DIAGNOSIS — L89109 Pressure ulcer of unspecified part of back, unspecified stage: Secondary | ICD-10-CM | POA: Diagnosis not present

## 2013-07-27 DIAGNOSIS — L8992 Pressure ulcer of unspecified site, stage 2: Secondary | ICD-10-CM | POA: Diagnosis not present

## 2013-07-28 DIAGNOSIS — J849 Interstitial pulmonary disease, unspecified: Secondary | ICD-10-CM | POA: Insufficient documentation

## 2013-07-28 NOTE — Assessment & Plan Note (Addendum)
Recent flare complicated by Hypoxic RF  Need PFT  Advised on o2 use /compliance  Plan  Begin Symbicort 160/4.69mcg 2 puffs Twice daily  , rinse after use.  May use Albuterol Inhaler 2 puffs every 4hr as needed.  Follow up with Dr. Elsworth Soho  Or Available doctor with 4-6 weeks with PFT.  Follow up with primary doctor next week as planned for follow up and labs

## 2013-07-28 NOTE — Assessment & Plan Note (Addendum)
Recent admission with abnormal CT chest > New extensive interstitial lung disease primarily in the upper lobes with progressive bronchiectasis at the right base and new extensive bronchiectasis at the left base. . New hilar and mediastinal adenopathy. Initial AFB neg , follow final results  ANA /RA factor positive consider repeat on return , if positive consider referral to Rheumatology  Encouraged on smoking cessation  Will need most likely follow up CT in 3 months Discuss on return visit regarding possible bronchoscopy. Will discuss regarding need FOB sooner .  CXR today

## 2013-07-28 NOTE — Progress Notes (Signed)
   Subjective:    Patient ID: [name], female    DOB: 11/29/1932, 80 y.o.   MRN: [id number]  HPI 80 year old female w/ reported h/o COPD, HL, remote PE, colonic adenoma and lymphocytic colitis, admitted on 06/19/13 for COPD exacerbation and     07/25/13 [facility] follow up  Returns for post hospital follow up.  Admitted 6/10-6/16 for AECOPD w/ Hypoxia  CT chest shows new hilar and mediastinal adenpathy and new extensive interstitial lung disease  Tx w/ IV abx , steroids and nebs.  Bronchiectasis - new changes on CT, prelim Sputum AFB neg , final pending .  Workup showed -ACE 48, RA mild pos, ESR 30, ANA positive  Discharged on Symbicort but had not gotten .  Has ov next week with family MD .  She is feeling better, not wearing O2 all the time.  Denies chest pain, orthopnea, edema or fever.  Not smoking .     Review of Systems Constitutional:   No  weight loss, night sweats,  Fevers, chills,  +fatigue, or  lassitude.  HEENT:   No headaches,  Difficulty swallowing,  Tooth/dental problems, or  Sore throat,                No sneezing, itching, ear ache, + nasal congestion, post nasal drip,   CV:  No chest pain,  Orthopnea, PND, swelling in lower extremities, anasarca, dizziness, palpitations, syncope.   GI  No heartburn, indigestion, abdominal pain, nausea, vomiting, diarrhea, change in bowel habits, loss of appetite, bloody stools.   Resp:   No chest wall deformity  Skin: no rash or lesions.  GU: no dysuria, change in color of urine, no urgency or frequency.  No flank pain, no hematuria   MS:  No joint pain or swelling.  No decreased range of motion.  No back pain.  Psych:  No change in mood or affect. No depression or anxiety.  No memory loss.         Objective:   Physical Exam GEN: A/Ox3; pleasant , NAD,  thin frail   HEENT:  [place]/AT,  EACs-clear, TMs-wnl, NOSE-clear, THROAT-clear, no lesions, no postnasal drip or exudate noted.   NECK:  Supple w/ fair  ROM; no JVD; normal carotid impulses w/o bruits; no thyromegaly or nodules palpated; no lymphadenopathy.  RESP  Decreased BS in bases, no accessory muscle use, no dullness to percussion  CARD:  RRR, no m/r/g  , no peripheral edema, pulses intact, no cyanosis or clubbing.  GI:   Soft & nt; nml bowel sounds; no organomegaly or masses detected.  Musco: Warm bil, no deformities or joint swelling noted.   Neuro: alert, no focal deficits noted.    Skin: Warm, no lesions or rashes         Assessment & Plan:

## 2013-07-29 NOTE — Progress Notes (Signed)
Reviewed & agree with plan  

## 2013-07-30 DIAGNOSIS — L89109 Pressure ulcer of unspecified part of back, unspecified stage: Secondary | ICD-10-CM | POA: Diagnosis not present

## 2013-07-30 DIAGNOSIS — I739 Peripheral vascular disease, unspecified: Secondary | ICD-10-CM | POA: Diagnosis not present

## 2013-07-30 DIAGNOSIS — J471 Bronchiectasis with (acute) exacerbation: Secondary | ICD-10-CM | POA: Diagnosis not present

## 2013-07-30 DIAGNOSIS — J441 Chronic obstructive pulmonary disease with (acute) exacerbation: Secondary | ICD-10-CM | POA: Diagnosis not present

## 2013-07-30 DIAGNOSIS — L8992 Pressure ulcer of unspecified site, stage 2: Secondary | ICD-10-CM | POA: Diagnosis not present

## 2013-07-30 DIAGNOSIS — E43 Unspecified severe protein-calorie malnutrition: Secondary | ICD-10-CM | POA: Diagnosis not present

## 2013-07-31 ENCOUNTER — Encounter: Payer: Self-pay | Admitting: Nurse Practitioner

## 2013-07-31 ENCOUNTER — Ambulatory Visit (INDEPENDENT_AMBULATORY_CARE_PROVIDER_SITE_OTHER): Payer: Medicare Other | Admitting: Nurse Practitioner

## 2013-07-31 VITALS — BP 108/62 | HR 88 | Temp 98.1°F | Ht 65.5 in | Wt 100.0 lb

## 2013-07-31 DIAGNOSIS — R5381 Other malaise: Secondary | ICD-10-CM

## 2013-07-31 DIAGNOSIS — J441 Chronic obstructive pulmonary disease with (acute) exacerbation: Secondary | ICD-10-CM

## 2013-07-31 DIAGNOSIS — I739 Peripheral vascular disease, unspecified: Secondary | ICD-10-CM | POA: Diagnosis not present

## 2013-07-31 DIAGNOSIS — L8992 Pressure ulcer of unspecified site, stage 2: Secondary | ICD-10-CM | POA: Diagnosis not present

## 2013-07-31 DIAGNOSIS — K589 Irritable bowel syndrome without diarrhea: Secondary | ICD-10-CM | POA: Diagnosis not present

## 2013-07-31 DIAGNOSIS — L89109 Pressure ulcer of unspecified part of back, unspecified stage: Secondary | ICD-10-CM | POA: Diagnosis not present

## 2013-07-31 DIAGNOSIS — R5383 Other fatigue: Secondary | ICD-10-CM | POA: Diagnosis not present

## 2013-07-31 DIAGNOSIS — E43 Unspecified severe protein-calorie malnutrition: Secondary | ICD-10-CM

## 2013-07-31 DIAGNOSIS — K219 Gastro-esophageal reflux disease without esophagitis: Secondary | ICD-10-CM

## 2013-07-31 DIAGNOSIS — J471 Bronchiectasis with (acute) exacerbation: Secondary | ICD-10-CM | POA: Diagnosis not present

## 2013-07-31 DIAGNOSIS — N390 Urinary tract infection, site not specified: Secondary | ICD-10-CM

## 2013-07-31 LAB — POCT URINALYSIS DIPSTICK
Bilirubin, UA: NEGATIVE
Glucose, UA: NEGATIVE
Ketones, UA: NEGATIVE
Nitrite, UA: POSITIVE
PH UA: 6
Spec Grav, UA: 1.01
Urobilinogen, UA: NEGATIVE

## 2013-07-31 NOTE — Progress Notes (Signed)
Subjective:    Patient ID: [name], female    DOB: 06/18/1932, 80 y.o.   MRN: [id number]  HPI Patient in today for follow- up- SHe has been in the hospital recently for respiratory failure, constipation and elevated WBC count- Sh es doing much better- Is starting to gain her energy back and breathing is improving. SHe has lost a lot of weight since she has been sick. She has multiple medical problems. Patient Active Problem List   Diagnosis Date Noted  . Bronchiectasis 07/28/2013  . Protein-calorie malnutrition, severe 06/21/2013  . COPD exacerbation 06/20/2013  . Acute respiratory failure with hypoxia 06/19/2013  . Peripheral vascular disease, unspecified 05/17/2012  . Pain in limb 05/17/2012  . Edema 02/16/2012  . IBS (irritable bowel syndrome) 02/09/2012  . Lymphocytic colitis 12/15/2011  . Personal history of adenomatous colonic polyps 12/02/2011  . GERD 10/20/2008   Outpatient Encounter Prescriptions as of 07/31/2013  Medication Sig  . albuterol (PROVENTIL HFA;VENTOLIN HFA) 108 (90 BASE) MCG/ACT inhaler Inhale 2 puffs into the lungs every 4 (four) hours as needed for wheezing or shortness of breath.  . ALPRAZolam (XANAX) 0.5 MG tablet Take 0.5 mg by mouth 2 (two) times daily.  Marland Kitchen aspirin EC 325 MG tablet Take 325 mg by mouth daily.  . budesonide-formoterol (SYMBICORT) 160-4.5 MCG/ACT inhaler Inhale 2 puffs into the lungs 2 (two) times daily.  . Cholecalciferol (VITAMIN D) 2000 UNITS tablet Take 2,000 Units by mouth daily.  . colestipol (COLESTID) 1 G tablet Take 2 g by mouth See admin instructions. Takes once or twice a day depending on bowel movements  . CRANBERRY PO Take 1 tablet by mouth daily.  . CRESTOR 10 MG tablet TAKE 1 TABLET DAILY  . Guaifenesin (MUCINEX MAXIMUM STRENGTH) 1200 MG TB12 Take 1,200 mg by mouth every morning.  Marland Kitchen guaiFENesin (MUCINEX) 600 MG 12 hr tablet Take 600 mg by mouth every evening.   Marland Kitchen ipratropium-albuterol (DUONEB) 0.5-2.5 (3) MG/3ML SOLN Take  3 mLs by nebulization 2 (two) times daily. And take every two hours if needed for shortness of breath.  . oxybutynin (DITROPAN) 5 MG tablet Take 1 tablet (5 mg total) by mouth 2 (two) times daily.  . pantoprazole (PROTONIX) 40 MG tablet Take 1 tablet (40 mg total) by mouth daily.  . pantoprazole (PROTONIX) 40 MG tablet TAKE 1 TABLET DAILY  . Polyethyl Glycol-Propyl Glycol (SYSTANE OP) Place 1 drop into both eyes daily.  . raloxifene (EVISTA) 60 MG tablet Take 1 tablet (60 mg total) by mouth every other day.  . rosuvastatin (CRESTOR) 10 MG tablet Take 1 tablet (10 mg total) by mouth daily.  Marland Kitchen triamterene-hydrochlorothiazide (DYAZIDE) 37.5-25 MG per capsule Take 1 each (1 capsule total) by mouth daily.  . vitamin E 400 UNIT capsule Take 400 Units by mouth daily.      Review of Systems  Constitutional: Positive for appetite change (srill decreased) and fatigue.  HENT: Negative.   Respiratory: Positive for cough and shortness of breath.   Cardiovascular: Negative.   Gastrointestinal: Negative.   Genitourinary: Negative.   Neurological: Negative.   Psychiatric/Behavioral: Negative.   All other systems reviewed and are negative.      Objective:   Physical Exam  Constitutional: She is oriented to person, place, and time. She appears well-developed and well-nourished.  HENT:  Nose: Nose normal.  Mouth/Throat: Oropharynx is clear and moist.  Eyes: EOM are normal.  Neck: Trachea normal, normal range of motion and full passive range of motion  without pain. Neck supple. No JVD present. Carotid bruit is not present. No thyromegaly present.  Cardiovascular: Normal rate, regular rhythm, normal heart sounds and intact distal pulses.  Exam reveals no gallop and no friction rub.   No murmur heard. Pulmonary/Chest: Effort normal and breath sounds normal.  Abdominal: Soft. Bowel sounds are normal. She exhibits no distension and no mass. There is no tenderness.  Musculoskeletal: Normal range of  motion.  Lymphadenopathy:    She has no cervical adenopathy.  Neurological: She is alert and oriented to person, place, and time. She has normal reflexes.  Skin: Skin is warm and dry.  Psychiatric: She has a normal mood and affect. Her behavior is normal. Judgment and thought content normal.   BP 108/62  Pulse 88  Temp(Src) 98.1 F (36.7 C) (Oral)  Ht 5' 5.5" (1.664 m)  Wt 100 lb (45.36 kg)  BMI 16.38 kg/m2         Assessment & Plan:   1. Protein-calorie malnutrition, severe   2. Peripheral vascular disease, unspecified   3. IBS (irritable bowel syndrome)   4. Gastroesophageal reflux disease, esophagitis presence not specified   5. COPD exacerbation   6. Other malaise and fatigue    Orders Placed This Encounter  Procedures  . CMP14+EGFR  . NMR, lipoprofile  . Anemia Profile B  . POCT UA - Microscopic Only  . POCT urinalysis dipstick   Increase calories- eat ice cream or milk shake at bedtime- will not do glucerna or boost Labs pending Health maintenance reviewed Diet and exercise encouraged Continue all meds Follow up  In 3 months   Aristes, FNP

## 2013-07-31 NOTE — Patient Instructions (Signed)

## 2013-08-01 ENCOUNTER — Other Ambulatory Visit: Payer: Self-pay | Admitting: Nurse Practitioner

## 2013-08-01 ENCOUNTER — Telehealth: Payer: Self-pay | Admitting: Family Medicine

## 2013-08-01 LAB — ANEMIA PROFILE B
BASOS ABS: 0.1 10*3/uL (ref 0.0–0.2)
Basos: 1 %
EOS ABS: 0.3 10*3/uL (ref 0.0–0.4)
Eos: 3 %
FOLATE: 10.5 ng/mL (ref >3.0)
Ferritin: 125 ng/mL (ref 15–150)
HEMATOCRIT: 38.7 % (ref 34.0–46.6)
Hemoglobin: 12.9 g/dL (ref 11.1–15.9)
IMMATURE GRANULOCYTES: 0 %
IRON SATURATION: 13 % — AB (ref 15–55)
Immature Grans (Abs): 0 10*3/uL (ref 0.0–0.1)
Iron: 37 ug/dL (ref 35–155)
LYMPHS: 24 %
Lymphocytes Absolute: 2.6 10*3/uL (ref 0.7–3.1)
MCH: 27.2 pg (ref 26.6–33.0)
MCHC: 33.3 g/dL (ref 31.5–35.7)
MCV: 82 fL (ref 79–97)
Monocytes Absolute: 1.1 10*3/uL — ABNORMAL HIGH (ref 0.1–0.9)
Monocytes: 10 %
NEUTROS ABS: 6.9 10*3/uL (ref 1.4–7.0)
NEUTROS PCT: 62 %
PLATELETS: 464 10*3/uL — AB (ref 150–379)
RBC: 4.74 x10E6/uL (ref 3.77–5.28)
RDW: 17.6 % — AB (ref 12.3–15.4)
RETIC CT PCT: 1.9 % (ref 0.6–2.6)
TIBC: 285 ug/dL (ref 250–450)
UIBC: 248 ug/dL (ref 150–375)
VITAMIN B 12: 974 pg/mL — AB (ref 211–946)
WBC: 11 10*3/uL — ABNORMAL HIGH (ref 3.4–10.8)

## 2013-08-01 LAB — NMR, LIPOPROFILE
CHOLESTEROL: 195 mg/dL (ref 100–199)
HDL Cholesterol by NMR: 58 mg/dL (ref >39)
HDL Particle Number: 34.7 umol/L (ref >=30.5)
LDL Particle Number: 1264 nmol/L — ABNORMAL HIGH (ref <1000)
LDL Size: 20.6 nm (ref >20.5)
LDLC SERPL CALC-MCNC: 116 mg/dL — AB (ref 0–99)
LP-IR Score: 30 (ref <=45)
Small LDL Particle Number: 572 nmol/L — ABNORMAL HIGH (ref <=527)
Triglycerides by NMR: 105 mg/dL (ref 0–149)

## 2013-08-01 LAB — CMP14+EGFR
ALBUMIN: 4.1 g/dL (ref 3.5–4.8)
ALT: 10 IU/L (ref 0–32)
AST: 16 IU/L (ref 0–40)
Albumin/Globulin Ratio: 1.6 (ref 1.1–2.5)
Alkaline Phosphatase: 56 IU/L (ref 39–117)
BILIRUBIN TOTAL: 0.4 mg/dL (ref 0.0–1.2)
BUN / CREAT RATIO: 21 (ref 11–26)
BUN: 16 mg/dL (ref 8–27)
CO2: 24 mmol/L (ref 18–29)
CREATININE: 0.78 mg/dL (ref 0.57–1.00)
Calcium: 9.5 mg/dL (ref 8.7–10.3)
Chloride: 95 mmol/L — ABNORMAL LOW (ref 97–108)
GFR calc non Af Amer: 73 mL/min/{1.73_m2} (ref >59)
GFR, EST AFRICAN AMERICAN: 84 mL/min/{1.73_m2} (ref >59)
GLOBULIN, TOTAL: 2.6 g/dL (ref 1.5–4.5)
Glucose: 96 mg/dL (ref 65–99)
Potassium: 4.9 mmol/L (ref 3.5–5.2)
Sodium: 140 mmol/L (ref 134–144)
Total Protein: 6.7 g/dL (ref 6.0–8.5)

## 2013-08-01 MED ORDER — CIPROFLOXACIN HCL 500 MG PO TABS: 500.0000 mg | ORAL_TABLET | Freq: Two times a day (BID) | ORAL | Status: DC

## 2013-08-01 NOTE — Telephone Encounter (Signed)
Message copied by Waverly Ferrari on Thu Aug 01, 2013  2:47 PM ------      Message from: Chevis Pretty      Created: Thu Aug 01, 2013  1:11 PM       Urine - early UTI- rx for cipro sent to pharmacy      WBC elevated- may be due to UTI but they are down from when in hospital      Cholesterol doesn't look to bad      platlets are elevated- need to repeat anemia panle in 2 weeks ------

## 2013-08-02 LAB — URINE CULTURE

## 2013-08-04 LAB — AFB CULTURE WITH SMEAR (NOT AT ARMC): ACID FAST SMEAR: NONE SEEN

## 2013-08-05 ENCOUNTER — Other Ambulatory Visit: Payer: Self-pay | Admitting: Nurse Practitioner

## 2013-08-05 ENCOUNTER — Telehealth: Payer: Self-pay | Admitting: Family Medicine

## 2013-08-05 DIAGNOSIS — E43 Unspecified severe protein-calorie malnutrition: Secondary | ICD-10-CM | POA: Diagnosis not present

## 2013-08-05 DIAGNOSIS — L89109 Pressure ulcer of unspecified part of back, unspecified stage: Secondary | ICD-10-CM | POA: Diagnosis not present

## 2013-08-05 DIAGNOSIS — L8992 Pressure ulcer of unspecified site, stage 2: Secondary | ICD-10-CM | POA: Diagnosis not present

## 2013-08-05 DIAGNOSIS — I739 Peripheral vascular disease, unspecified: Secondary | ICD-10-CM | POA: Diagnosis not present

## 2013-08-05 DIAGNOSIS — J441 Chronic obstructive pulmonary disease with (acute) exacerbation: Secondary | ICD-10-CM | POA: Diagnosis not present

## 2013-08-05 DIAGNOSIS — J471 Bronchiectasis with (acute) exacerbation: Secondary | ICD-10-CM | POA: Diagnosis not present

## 2013-08-05 MED ORDER — CIPROFLOXACIN HCL 500 MG PO TABS: 500.0000 mg | ORAL_TABLET | Freq: Two times a day (BID) | ORAL | Status: DC

## 2013-08-05 NOTE — Telephone Encounter (Signed)
Message copied by Waverly Ferrari on Mon Aug 05, 2013  3:14 PM ------      Message from: Chevis Pretty      Created: Mon Aug 05, 2013  1:43 PM       UTI- cipro rx sent to pharmacy ------

## 2013-08-06 ENCOUNTER — Encounter: Payer: Self-pay | Admitting: Family Medicine

## 2013-08-08 DIAGNOSIS — J471 Bronchiectasis with (acute) exacerbation: Secondary | ICD-10-CM | POA: Diagnosis not present

## 2013-08-08 DIAGNOSIS — I739 Peripheral vascular disease, unspecified: Secondary | ICD-10-CM | POA: Diagnosis not present

## 2013-08-08 DIAGNOSIS — E43 Unspecified severe protein-calorie malnutrition: Secondary | ICD-10-CM | POA: Diagnosis not present

## 2013-08-08 DIAGNOSIS — L8992 Pressure ulcer of unspecified site, stage 2: Secondary | ICD-10-CM | POA: Diagnosis not present

## 2013-08-08 DIAGNOSIS — L89109 Pressure ulcer of unspecified part of back, unspecified stage: Secondary | ICD-10-CM | POA: Diagnosis not present

## 2013-08-08 DIAGNOSIS — J441 Chronic obstructive pulmonary disease with (acute) exacerbation: Secondary | ICD-10-CM | POA: Diagnosis not present

## 2013-08-09 DIAGNOSIS — I739 Peripheral vascular disease, unspecified: Secondary | ICD-10-CM | POA: Diagnosis not present

## 2013-08-09 DIAGNOSIS — E43 Unspecified severe protein-calorie malnutrition: Secondary | ICD-10-CM | POA: Diagnosis not present

## 2013-08-09 DIAGNOSIS — J441 Chronic obstructive pulmonary disease with (acute) exacerbation: Secondary | ICD-10-CM

## 2013-08-09 DIAGNOSIS — J471 Bronchiectasis with (acute) exacerbation: Secondary | ICD-10-CM | POA: Diagnosis not present

## 2013-08-09 DIAGNOSIS — L89109 Pressure ulcer of unspecified part of back, unspecified stage: Secondary | ICD-10-CM

## 2013-08-09 DIAGNOSIS — L8992 Pressure ulcer of unspecified site, stage 2: Secondary | ICD-10-CM | POA: Diagnosis not present

## 2013-08-12 DIAGNOSIS — E43 Unspecified severe protein-calorie malnutrition: Secondary | ICD-10-CM | POA: Diagnosis not present

## 2013-08-12 DIAGNOSIS — L8992 Pressure ulcer of unspecified site, stage 2: Secondary | ICD-10-CM | POA: Diagnosis not present

## 2013-08-12 DIAGNOSIS — L89109 Pressure ulcer of unspecified part of back, unspecified stage: Secondary | ICD-10-CM | POA: Diagnosis not present

## 2013-08-12 DIAGNOSIS — I739 Peripheral vascular disease, unspecified: Secondary | ICD-10-CM | POA: Diagnosis not present

## 2013-08-12 DIAGNOSIS — J471 Bronchiectasis with (acute) exacerbation: Secondary | ICD-10-CM | POA: Diagnosis not present

## 2013-08-12 DIAGNOSIS — J441 Chronic obstructive pulmonary disease with (acute) exacerbation: Secondary | ICD-10-CM | POA: Diagnosis not present

## 2013-08-13 DIAGNOSIS — L8992 Pressure ulcer of unspecified site, stage 2: Secondary | ICD-10-CM | POA: Diagnosis not present

## 2013-08-13 DIAGNOSIS — I739 Peripheral vascular disease, unspecified: Secondary | ICD-10-CM | POA: Diagnosis not present

## 2013-08-13 DIAGNOSIS — J441 Chronic obstructive pulmonary disease with (acute) exacerbation: Secondary | ICD-10-CM | POA: Diagnosis not present

## 2013-08-13 DIAGNOSIS — J471 Bronchiectasis with (acute) exacerbation: Secondary | ICD-10-CM | POA: Diagnosis not present

## 2013-08-13 DIAGNOSIS — L89109 Pressure ulcer of unspecified part of back, unspecified stage: Secondary | ICD-10-CM | POA: Diagnosis not present

## 2013-08-13 DIAGNOSIS — E43 Unspecified severe protein-calorie malnutrition: Secondary | ICD-10-CM | POA: Diagnosis not present

## 2013-08-16 DIAGNOSIS — I739 Peripheral vascular disease, unspecified: Secondary | ICD-10-CM | POA: Diagnosis not present

## 2013-08-16 DIAGNOSIS — J441 Chronic obstructive pulmonary disease with (acute) exacerbation: Secondary | ICD-10-CM | POA: Diagnosis not present

## 2013-08-16 DIAGNOSIS — L89109 Pressure ulcer of unspecified part of back, unspecified stage: Secondary | ICD-10-CM | POA: Diagnosis not present

## 2013-08-16 DIAGNOSIS — E43 Unspecified severe protein-calorie malnutrition: Secondary | ICD-10-CM | POA: Diagnosis not present

## 2013-08-16 DIAGNOSIS — J471 Bronchiectasis with (acute) exacerbation: Secondary | ICD-10-CM | POA: Diagnosis not present

## 2013-08-16 DIAGNOSIS — L8992 Pressure ulcer of unspecified site, stage 2: Secondary | ICD-10-CM | POA: Diagnosis not present

## 2013-08-21 DIAGNOSIS — L89109 Pressure ulcer of unspecified part of back, unspecified stage: Secondary | ICD-10-CM | POA: Diagnosis not present

## 2013-08-21 DIAGNOSIS — L8992 Pressure ulcer of unspecified site, stage 2: Secondary | ICD-10-CM | POA: Diagnosis not present

## 2013-08-21 DIAGNOSIS — J441 Chronic obstructive pulmonary disease with (acute) exacerbation: Secondary | ICD-10-CM | POA: Diagnosis not present

## 2013-08-21 DIAGNOSIS — E43 Unspecified severe protein-calorie malnutrition: Secondary | ICD-10-CM | POA: Diagnosis not present

## 2013-08-21 DIAGNOSIS — I739 Peripheral vascular disease, unspecified: Secondary | ICD-10-CM | POA: Diagnosis not present

## 2013-08-21 DIAGNOSIS — J471 Bronchiectasis with (acute) exacerbation: Secondary | ICD-10-CM | POA: Diagnosis not present

## 2013-08-27 ENCOUNTER — Telehealth: Payer: Self-pay | Admitting: Nurse Practitioner

## 2013-08-27 MED ORDER — CIPROFLOXACIN HCL 500 MG PO TABS: 500.0000 mg | ORAL_TABLET | Freq: Two times a day (BID) | ORAL | Status: DC

## 2013-08-27 NOTE — Telephone Encounter (Signed)
Please review and advise.

## 2013-08-28 ENCOUNTER — Encounter: Payer: Self-pay | Admitting: Vascular Surgery

## 2013-08-28 ENCOUNTER — Encounter: Payer: Self-pay | Admitting: Pulmonary Disease

## 2013-08-28 ENCOUNTER — Ambulatory Visit (INDEPENDENT_AMBULATORY_CARE_PROVIDER_SITE_OTHER): Payer: Medicare Other | Admitting: Pulmonary Disease

## 2013-08-28 VITALS — BP 124/62 | HR 66 | Temp 97.0°F | Ht 63.5 in | Wt 103.0 lb

## 2013-08-28 DIAGNOSIS — J9600 Acute respiratory failure, unspecified whether with hypoxia or hypercapnia: Secondary | ICD-10-CM

## 2013-08-28 DIAGNOSIS — J441 Chronic obstructive pulmonary disease with (acute) exacerbation: Secondary | ICD-10-CM | POA: Diagnosis not present

## 2013-08-28 DIAGNOSIS — J8410 Pulmonary fibrosis, unspecified: Secondary | ICD-10-CM

## 2013-08-28 DIAGNOSIS — J9601 Acute respiratory failure with hypoxia: Secondary | ICD-10-CM

## 2013-08-28 DIAGNOSIS — J449 Chronic obstructive pulmonary disease, unspecified: Secondary | ICD-10-CM | POA: Diagnosis not present

## 2013-08-28 DIAGNOSIS — IMO0001 Reserved for inherently not codable concepts without codable children: Secondary | ICD-10-CM

## 2013-08-28 DIAGNOSIS — J849 Interstitial pulmonary disease, unspecified: Secondary | ICD-10-CM

## 2013-08-28 LAB — PULMONARY FUNCTION TEST
DL/VA % pred: 63 %
DL/VA: 3 ml/min/mmHg/L
DLCO UNC: 10.29 ml/min/mmHg
DLCO unc % pred: 43 %
FEF 25-75 POST: 1.01 L/s
FEF 25-75 PRE: 1.3 L/s
FEF2575-%Change-Post: -22 %
FEF2575-%PRED-PRE: 92 %
FEF2575-%Pred-Post: 71 %
FEV1-%Change-Post: -5 %
FEV1-%PRED-PRE: 94 %
FEV1-%Pred-Post: 89 %
FEV1-Post: 1.71 L
FEV1-Pre: 1.81 L
FEV1FVC-%CHANGE-POST: -2 %
FEV1FVC-%Pred-Pre: 98 %
FEV6-%CHANGE-POST: -6 %
FEV6-%PRED-POST: 95 %
FEV6-%PRED-PRE: 101 %
FEV6-PRE: 2.48 L
FEV6-Post: 2.32 L
FEV6FVC-%Change-Post: -3 %
FEV6FVC-%PRED-POST: 101 %
FEV6FVC-%Pred-Pre: 105 %
FVC-%Change-Post: -2 %
FVC-%PRED-POST: 93 %
FVC-%Pred-Pre: 96 %
FVC-POST: 2.4 L
FVC-Pre: 2.48 L
POST FEV1/FVC RATIO: 71 %
PRE FEV1/FVC RATIO: 73 %
Post FEV6/FVC ratio: 96 %
Pre FEV6/FVC Ratio: 100 %

## 2013-08-28 NOTE — Progress Notes (Signed)
   Subjective:    Patient ID: [name], female    DOB: January 13, 1932, 80 y.o.   MRN: [id number]  HPI  80 year old smoker w/ reported h/o COPD, HL, remote PE, colonic adenoma and lymphocytic colitis, admitted  6/10 -06/25/13  w/ cc: 1 week h/o progressive weakness, c/b falls at home, worsening cough but unable to produce sputum. Then progressive SOB over the 3 days prior to presentation w/ increased wheezing. On presentation in the ER her sats were in 80s on room air, wbc CT 23.6.  CT of chest showed: New extensive interstitial lung disease primarily in the upper lobes with progressive bronchiectasis at the right base and new extensive bronchiectasis at the left base. And New hilar and mediastinal adenopathy- compared to CT from 2010.   H/o left empyema requiring thoracotomy, complicated by bleeding & PMV , tstomy -2007  Tx w/ IV abx , steroids and nebs.  Sputum AFB neg Workup showed -ACE 48, RA mild pos, ESR 30, ANA positive 1:40 PFTs  - FVC 96%, TLC 95%, ratio 74, no obstruction, DLCO 43% -10.3 Discharged on O2 ,Symbicort  Smokes 1-3 cigs/d    Past Surgical History  Procedure Laterality Date  . Cholecystectomy    . Colon resection    . Abdominal hysterectomy    . Brain surgery      10 YEARS AGO   . Cataract extraction      BOTH EYES  . Upper gastrointestinal endoscopy    . Colonoscopy    . Lower extremity stents      Per Dr. Oneida Alar     Review of Systems  neg for any significant sore throat, dysphagia, itching, sneezing, nasal congestion or excess/ purulent secretions, fever, chills, sweats, unintended wt loss, pleuritic or exertional cp, hempoptysis, orthopnea pnd or change in chronic leg swelling. Also denies presyncope, palpitations, heartburn, abdominal pain, nausea, vomiting, diarrhea or change in bowel or urinary habits, dysuria,hematuria, rash, arthralgias, visual complaints, headache, numbness weakness or ataxia.      Objective:   Physical Exam  Gen. Pleasant,  well-nourished, in no distress ENT - no lesions, no post nasal drip Neck: No JVD, no thyromegaly, no carotid bruits Lungs: no use of accessory muscles, no dullness to percussion, decreased without rales or rhonchi  Cardiovascular: Rhythm regular, heart sounds  normal, no murmurs or gallops, no peripheral edema Musculoskeletal: No deformities, no cyanosis or clubbing        Assessment & Plan:

## 2013-08-28 NOTE — Progress Notes (Signed)
PFT done today. 

## 2013-08-28 NOTE — Assessment & Plan Note (Addendum)
-  Will  need 12mnth FU CT scan for mediastinal Lns - can consider bronchoscopy as out pt for etiology (blind transbronchial biopsy has about 70% yield for sarcoid, preferably would need EBus or mediastonoscopy for lymph node biopsy - best to hold off on these considerations for now)

## 2013-08-28 NOTE — Patient Instructions (Addendum)
You have scarring -more in your left lung than right You have to STOP smoking OK to stop symbicort - take albuterol as needed only CT scan of chest Ok to discontinue O2

## 2013-08-28 NOTE — Assessment & Plan Note (Signed)
-  resolved Dc O2

## 2013-08-28 NOTE — Assessment & Plan Note (Signed)
No airway obs Can stop symbicort STOP smoking !!

## 2013-08-29 ENCOUNTER — Ambulatory Visit (HOSPITAL_COMMUNITY)
Admission: RE | Admit: 2013-08-29 | Discharge: 2013-08-29 | Disposition: A | Discharge status: Home / Self Care | Payer: Medicare Other | Source: Ambulatory Visit | Attending: Vascular Surgery | Admitting: Vascular Surgery

## 2013-08-29 ENCOUNTER — Other Ambulatory Visit: Payer: Self-pay | Admitting: Nurse Practitioner

## 2013-08-29 ENCOUNTER — Ambulatory Visit (INDEPENDENT_AMBULATORY_CARE_PROVIDER_SITE_OTHER)
Admission: RE | Admit: 2013-08-29 | Discharge: 2013-08-29 | Disposition: A | Discharge status: Home / Self Care | Payer: Medicare Other | Source: Ambulatory Visit | Attending: Vascular Surgery | Admitting: Vascular Surgery

## 2013-08-29 ENCOUNTER — Ambulatory Visit (INDEPENDENT_AMBULATORY_CARE_PROVIDER_SITE_OTHER): Payer: Medicare Other | Admitting: Vascular Surgery

## 2013-08-29 VITALS — BP 149/48 | HR 64 | Temp 97.6°F | Resp 14 | Ht 65.0 in | Wt 102.5 lb

## 2013-08-29 DIAGNOSIS — I739 Peripheral vascular disease, unspecified: Secondary | ICD-10-CM | POA: Diagnosis not present

## 2013-08-29 DIAGNOSIS — Z48812 Encounter for surgical aftercare following surgery on the circulatory system: Secondary | ICD-10-CM | POA: Diagnosis not present

## 2013-08-29 NOTE — Progress Notes (Signed)
[facility] Progress Note  Subjective: Patient is here for f/u from bilateral external iliac stenting (08/2012). She complains of left foot numbness that also bothers her intermittently at night. She denies any calf pain bilaterally with exercise because her shortness of breath usually interferes with exercise first. She is able to walk half a block before she is short of breath. She denies any rest pain at night. She currently is able to ambulate as much as she wishes. She has fairly significant pulmonary dysfunction and required hospitalization recently for this. She apparently was noted to have enlarged mediastinal lymph nodes and is scheduled for followup chest CT scan. She is followed by Dr. Elsworth Soho with pulmonary.      Physical Exam:  Filed Vitals:   08/29/13 1158  BP: 149/48  Pulse: 64  Temp: 97.6 F (36.4 C)  TempSrc: Oral  Resp: 14  Height: 5\' 5"  (1.651 m)  Weight: 102 lb 8 oz (46.494 kg)  SpO2: 98%    Pulses: palpable femoral pulses bilaterally. Palpable left popliteal pulse and palpable left DP/PT pulses. Right popliteal, DP and PT pulses non-palpable. Extremities: Feet slightly cool to touch. No ulcerations.    ABIs 02/21/13: Right- 0.65, Left -0.89.  The patient had bilateral ABIs performed today as well as an arterial duplex scan. This shows a chronic right superficial femoral artery occlusion. Right ABI is 0.73. Left ABI was 1.03. Monophasic waveforms on the right triphasic waveforms in the left   Assessment:  80 y.o. female is s/p bilateral external iliac stenting 08/2012 currently asymptomatic  Plan: -Follow-up in 6 months for ABIs -Encouraged smoking cessation.    Ruta Hinds, MD [facility] Office: [phone] Pager: [phone]

## 2013-08-29 NOTE — Addendum Note (Signed)
Addended by: Mena Goes on: 08/29/2013 01:40 PM   Modules accepted: Orders

## 2013-08-30 ENCOUNTER — Inpatient Hospital Stay: Admission: RE | Admit: 2013-08-30 | Payer: Medicare Other | Source: Ambulatory Visit

## 2013-08-30 NOTE — Telephone Encounter (Signed)
Please call in xanax with 1 refills 

## 2013-08-30 NOTE — Telephone Encounter (Signed)
rx called into pharmacy

## 2013-08-30 NOTE — Telephone Encounter (Signed)
Patient last seen in office on 07-31-13. Rx last filled on 07-26-13 for #60. Please advise. If approved please route to Pool B so nurse can phone in to pharmacy

## 2013-09-30 ENCOUNTER — Telehealth: Payer: Self-pay | Admitting: *Deleted

## 2013-09-30 NOTE — Telephone Encounter (Signed)
This CT is for mediastinal lymphadenopathy,hence need IV contrast They can do high-resolution cuts, would also help to clarify interstitial lung disease

## 2013-09-30 NOTE — Telephone Encounter (Signed)
[name] with CT calling wanting to know if CT scheduled for 10/03/13 should be a High Res Scan W/O contrast Per [name], usually with ILD protocol a High Res CT W/O is ordered.   Please specify Dr Elsworth Soho if you still would like to use the original order for CT with Constrast. Thanks.

## 2013-09-30 NOTE — Telephone Encounter (Signed)
Called made rose aware. Nothing further needed

## 2013-10-03 ENCOUNTER — Telehealth: Payer: Self-pay | Admitting: Pulmonary Disease

## 2013-10-03 ENCOUNTER — Ambulatory Visit (INDEPENDENT_AMBULATORY_CARE_PROVIDER_SITE_OTHER)
Admission: RE | Admit: 2013-10-03 | Discharge: 2013-10-03 | Disposition: A | Discharge status: Home / Self Care | Payer: Medicare Other | Source: Ambulatory Visit | Attending: Pulmonary Disease | Admitting: Pulmonary Disease

## 2013-10-03 DIAGNOSIS — J8410 Pulmonary fibrosis, unspecified: Secondary | ICD-10-CM | POA: Diagnosis not present

## 2013-10-03 DIAGNOSIS — J849 Interstitial pulmonary disease, unspecified: Secondary | ICD-10-CM

## 2013-10-03 DIAGNOSIS — R911 Solitary pulmonary nodule: Secondary | ICD-10-CM | POA: Diagnosis not present

## 2013-10-03 MED ORDER — IOHEXOL 300 MG/ML  SOLN
80.0000 mL | Freq: Once | INTRAMUSCULAR | Status: AC | PRN
  Administered 2013-10-03: 80 mL via INTRAVENOUS

## 2013-10-03 NOTE — Telephone Encounter (Signed)
Will discuss with pt on FU 9/29

## 2013-10-03 NOTE — Telephone Encounter (Signed)
Call report taken on this pt about her CT chest.   Radiologist is concerned about impression #2.  Will forward this message to RA to make him aware.

## 2013-10-08 ENCOUNTER — Encounter: Payer: Self-pay | Admitting: Pulmonary Disease

## 2013-10-08 ENCOUNTER — Ambulatory Visit (INDEPENDENT_AMBULATORY_CARE_PROVIDER_SITE_OTHER): Payer: Medicare Other | Admitting: Pulmonary Disease

## 2013-10-08 VITALS — BP 138/78 | HR 81 | Temp 98.2°F | Ht 65.0 in | Wt 100.4 lb

## 2013-10-08 DIAGNOSIS — Z23 Encounter for immunization: Secondary | ICD-10-CM | POA: Diagnosis not present

## 2013-10-08 DIAGNOSIS — J849 Interstitial pulmonary disease, unspecified: Secondary | ICD-10-CM

## 2013-10-08 DIAGNOSIS — J438 Other emphysema: Secondary | ICD-10-CM

## 2013-10-08 DIAGNOSIS — R911 Solitary pulmonary nodule: Secondary | ICD-10-CM

## 2013-10-08 DIAGNOSIS — J432 Centrilobular emphysema: Secondary | ICD-10-CM

## 2013-10-08 DIAGNOSIS — J8410 Pulmonary fibrosis, unspecified: Secondary | ICD-10-CM

## 2013-10-08 NOTE — Assessment & Plan Note (Signed)
Smoking cessation primary

## 2013-10-08 NOTE — Assessment & Plan Note (Signed)
Early malignancy vs resolving pneumonia CT scan in 3 months to follow up nodule in left lung

## 2013-10-08 NOTE — Progress Notes (Signed)
   Subjective:    Patient ID: [name], female    DOB: 02-06-1932, 80 y.o.   MRN: [id number]  HPI  80 year old smoker for FU of mild COPD & pulmonary nodule  PMh/o COPD, remote PE, colonic adenoma and lymphocytic colitis,  H/o left empyema requiring thoracotomy, complicated by bleeding & PMV , tstomy -2007  Admitted 6/10 -06/25/13 w/ cc: weakness,falls , cough , hypoxia & BL infiltrates, wbc CT 23.6. Tx w/ IV abx , steroids and nebs. Discharged on O2 ,Symbicort    10/08/2013  Chief Complaint  Patient presents with  . Follow-up    COPD/ CT scan results   Accompanied by friend/ neighbor Now off o2  Smokes 4-5 cigs/d Breathing ok, off symbicort  Significant tests/ events  CT chest 06/29/13: New extensive interstitial lung disease primarily in the upper lobes with progressive bronchiectasis at the right base and new extensive bronchiectasis at the left base. And New hilar and mediastinal adenopathy- compared to CT from 2010.  Sputum AFB neg  Workup showed -ACE 48, RA mild pos, ESR 30, ANA positive 1:40  PFTs - FVC 96%, TLC 95%, ratio 74, no obstruction, DLCO 43% -10.3  CT 10/03/13 -aggressive appearing 1.5 x 1.2 x 1.5 cm nodule with spiculated margins the causes retraction of the adjacent left major fissure    Review of Systems neg for any significant sore throat, dysphagia, itching, sneezing, nasal congestion or excess/ purulent secretions, fever, chills, sweats, unintended wt loss, pleuritic or exertional cp, hempoptysis, orthopnea pnd or change in chronic leg swelling. Also denies presyncope, palpitations, heartburn, abdominal pain, nausea, vomiting, diarrhea or change in bowel or urinary habits, dysuria,hematuria, rash, arthralgias, visual complaints, headache, numbness weakness or ataxia.     Objective:   Physical Exam  Gen. Pleasant, well-nourished, in no distress ENT - no lesions, no post nasal drip Neck: No JVD, no thyromegaly, no carotid bruits Lungs: no use of  accessory muscles, no dullness to percussion, clear without rales or rhonchi  Cardiovascular: Rhythm regular, heart sounds  normal, no murmurs or gallops, no peripheral edema Musculoskeletal: No deformities, no cyanosis or clubbing        Assessment & Plan:

## 2013-10-08 NOTE — Assessment & Plan Note (Signed)
resolved 

## 2013-10-08 NOTE — Patient Instructions (Signed)
You have to QUIT smoking CT scan in 3 months to follow up nodule in left lung

## 2013-10-23 ENCOUNTER — Telehealth: Payer: Self-pay | Admitting: *Deleted

## 2013-10-23 NOTE — Telephone Encounter (Signed)
Patient can no longer afford Crestor co pay of 98.36. Can her cholesterol meds be changed to something else. Please advise

## 2013-10-24 DIAGNOSIS — I70209 Unspecified atherosclerosis of native arteries of extremities, unspecified extremity: Secondary | ICD-10-CM | POA: Diagnosis not present

## 2013-10-24 DIAGNOSIS — L603 Nail dystrophy: Secondary | ICD-10-CM | POA: Diagnosis not present

## 2013-10-24 DIAGNOSIS — L84 Corns and callosities: Secondary | ICD-10-CM | POA: Diagnosis not present

## 2013-10-24 MED ORDER — ATORVASTATIN CALCIUM 40 MG PO TABS: 40.0000 mg | ORAL_TABLET | Freq: Every day | ORAL | Status: DC

## 2013-10-24 NOTE — Telephone Encounter (Signed)
Changed crestor to lipitor

## 2013-11-07 ENCOUNTER — Other Ambulatory Visit: Payer: Self-pay | Admitting: Nurse Practitioner

## 2013-11-11 ENCOUNTER — Other Ambulatory Visit: Payer: Self-pay | Admitting: *Deleted

## 2013-11-11 NOTE — Telephone Encounter (Signed)
Last filled 10/01/13, last seen 07/31/13. Call into [facility]

## 2013-11-11 NOTE — Telephone Encounter (Signed)
Script called to [facility].

## 2013-11-11 NOTE — Telephone Encounter (Signed)
Please call in xanax with 1 refills 

## 2013-12-19 ENCOUNTER — Encounter (HOSPITAL_COMMUNITY): Payer: Self-pay | Admitting: Vascular Surgery

## 2014-01-07 ENCOUNTER — Inpatient Hospital Stay: Admission: RE | Admit: 2014-01-07 | Payer: Medicare Other | Source: Ambulatory Visit

## 2014-01-09 ENCOUNTER — Other Ambulatory Visit: Payer: Self-pay | Admitting: Nurse Practitioner

## 2014-01-11 NOTE — Telephone Encounter (Signed)
Last seen 07/31/13, last filled 12/11/13. Call into [facility]

## 2014-01-12 NOTE — Telephone Encounter (Signed)
no more refills without being seen Please call in xanax with 0 refills

## 2014-01-13 ENCOUNTER — Telehealth: Payer: Self-pay | Admitting: *Deleted

## 2014-01-13 NOTE — Telephone Encounter (Signed)
Xanax called to [facility].

## 2014-01-22 ENCOUNTER — Other Ambulatory Visit: Payer: Self-pay | Admitting: Internal Medicine

## 2014-01-22 NOTE — Telephone Encounter (Signed)
Refill x 1 year 

## 2014-01-22 NOTE — Telephone Encounter (Signed)
Patient seen 02/2012 Sir.  May I refill her medicine?

## 2014-01-23 ENCOUNTER — Ambulatory Visit (INDEPENDENT_AMBULATORY_CARE_PROVIDER_SITE_OTHER)
Admission: RE | Admit: 2014-01-23 | Discharge: 2014-01-23 | Disposition: A | Discharge status: Home / Self Care | Payer: Medicare Other | Source: Ambulatory Visit | Attending: Pulmonary Disease | Admitting: Pulmonary Disease

## 2014-01-23 ENCOUNTER — Emergency Department (HOSPITAL_COMMUNITY): Payer: Medicare Other

## 2014-01-23 ENCOUNTER — Encounter (HOSPITAL_COMMUNITY): Payer: Self-pay | Admitting: Emergency Medicine

## 2014-01-23 ENCOUNTER — Emergency Department (HOSPITAL_COMMUNITY)
Admission: EM | Admit: 2014-01-23 | Discharge: 2014-01-24 | Disposition: A | Discharge status: Home / Self Care | Payer: Medicare Other | Attending: Emergency Medicine | Admitting: Emergency Medicine

## 2014-01-23 DIAGNOSIS — Z7982 Long term (current) use of aspirin: Secondary | ICD-10-CM | POA: Diagnosis not present

## 2014-01-23 DIAGNOSIS — F419 Anxiety disorder, unspecified: Secondary | ICD-10-CM | POA: Diagnosis not present

## 2014-01-23 DIAGNOSIS — Z880 Allergy status to penicillin: Secondary | ICD-10-CM | POA: Diagnosis not present

## 2014-01-23 DIAGNOSIS — Z720 Tobacco use: Secondary | ICD-10-CM | POA: Insufficient documentation

## 2014-01-23 DIAGNOSIS — Y9389 Activity, other specified: Secondary | ICD-10-CM | POA: Diagnosis not present

## 2014-01-23 DIAGNOSIS — E785 Hyperlipidemia, unspecified: Secondary | ICD-10-CM | POA: Insufficient documentation

## 2014-01-23 DIAGNOSIS — J449 Chronic obstructive pulmonary disease, unspecified: Secondary | ICD-10-CM | POA: Insufficient documentation

## 2014-01-23 DIAGNOSIS — S0990XA Unspecified injury of head, initial encounter: Secondary | ICD-10-CM | POA: Diagnosis not present

## 2014-01-23 DIAGNOSIS — Z8669 Personal history of other diseases of the nervous system and sense organs: Secondary | ICD-10-CM | POA: Diagnosis not present

## 2014-01-23 DIAGNOSIS — Z79899 Other long term (current) drug therapy: Secondary | ICD-10-CM | POA: Insufficient documentation

## 2014-01-23 DIAGNOSIS — S199XXA Unspecified injury of neck, initial encounter: Secondary | ICD-10-CM | POA: Insufficient documentation

## 2014-01-23 DIAGNOSIS — Z86711 Personal history of pulmonary embolism: Secondary | ICD-10-CM | POA: Diagnosis not present

## 2014-01-23 DIAGNOSIS — Z85038 Personal history of other malignant neoplasm of large intestine: Secondary | ICD-10-CM | POA: Diagnosis not present

## 2014-01-23 DIAGNOSIS — K279 Peptic ulcer, site unspecified, unspecified as acute or chronic, without hemorrhage or perforation: Secondary | ICD-10-CM | POA: Diagnosis not present

## 2014-01-23 DIAGNOSIS — Y998 Other external cause status: Secondary | ICD-10-CM | POA: Diagnosis not present

## 2014-01-23 DIAGNOSIS — R911 Solitary pulmonary nodule: Secondary | ICD-10-CM | POA: Diagnosis not present

## 2014-01-23 DIAGNOSIS — S0181XA Laceration without foreign body of other part of head, initial encounter: Secondary | ICD-10-CM | POA: Insufficient documentation

## 2014-01-23 DIAGNOSIS — Y9289 Other specified places as the place of occurrence of the external cause: Secondary | ICD-10-CM | POA: Diagnosis not present

## 2014-01-23 DIAGNOSIS — W010XXA Fall on same level from slipping, tripping and stumbling without subsequent striking against object, initial encounter: Secondary | ICD-10-CM | POA: Diagnosis not present

## 2014-01-23 DIAGNOSIS — W19XXXA Unspecified fall, initial encounter: Secondary | ICD-10-CM

## 2014-01-23 DIAGNOSIS — Z23 Encounter for immunization: Secondary | ICD-10-CM | POA: Diagnosis not present

## 2014-01-23 DIAGNOSIS — R918 Other nonspecific abnormal finding of lung field: Secondary | ICD-10-CM | POA: Diagnosis not present

## 2014-01-23 DIAGNOSIS — Z8701 Personal history of pneumonia (recurrent): Secondary | ICD-10-CM | POA: Diagnosis not present

## 2014-01-23 DIAGNOSIS — M542 Cervicalgia: Secondary | ICD-10-CM | POA: Diagnosis not present

## 2014-01-23 DIAGNOSIS — K219 Gastro-esophageal reflux disease without esophagitis: Secondary | ICD-10-CM | POA: Diagnosis not present

## 2014-01-23 DIAGNOSIS — F329 Major depressive disorder, single episode, unspecified: Secondary | ICD-10-CM | POA: Insufficient documentation

## 2014-01-23 DIAGNOSIS — R51 Headache: Secondary | ICD-10-CM | POA: Diagnosis not present

## 2014-01-23 LAB — CBC WITH DIFFERENTIAL/PLATELET
BASOS PCT: 1 % (ref 0–1)
Basophils Absolute: 0.1 10*3/uL (ref 0.0–0.1)
Eosinophils Absolute: 0.6 10*3/uL (ref 0.0–0.7)
Eosinophils Relative: 4 % (ref 0–5)
HEMATOCRIT: 35.7 % — AB (ref 36.0–46.0)
Hemoglobin: 12.1 g/dL (ref 12.0–15.0)
LYMPHS PCT: 15 % (ref 12–46)
Lymphs Abs: 2.5 10*3/uL (ref 0.7–4.0)
MCH: 26.2 pg (ref 26.0–34.0)
MCHC: 33.9 g/dL (ref 30.0–36.0)
MCV: 77.4 fL — ABNORMAL LOW (ref 78.0–100.0)
MONO ABS: 1.3 10*3/uL — AB (ref 0.1–1.0)
Monocytes Relative: 8 % (ref 3–12)
NEUTROS ABS: 12.5 10*3/uL — AB (ref 1.7–7.7)
NEUTROS PCT: 74 % (ref 43–77)
PLATELETS: 446 10*3/uL — AB (ref 150–400)
RBC: 4.61 MIL/uL (ref 3.87–5.11)
RDW: 16.6 % — ABNORMAL HIGH (ref 11.5–15.5)
WBC: 16.9 10*3/uL — ABNORMAL HIGH (ref 4.0–10.5)

## 2014-01-23 LAB — COMPREHENSIVE METABOLIC PANEL
ALT: 20 U/L (ref 0–35)
ANION GAP: 15 (ref 5–15)
AST: 29 U/L (ref 0–37)
Albumin: 3.6 g/dL (ref 3.5–5.2)
Alkaline Phosphatase: 82 U/L (ref 39–117)
BUN: 16 mg/dL (ref 6–23)
CHLORIDE: 97 meq/L (ref 96–112)
CO2: 22 mmol/L (ref 19–32)
Calcium: 9.8 mg/dL (ref 8.4–10.5)
Creatinine, Ser: 0.83 mg/dL (ref 0.50–1.10)
GFR calc Af Amer: 76 mL/min — ABNORMAL LOW (ref >90)
GFR, EST NON AFRICAN AMERICAN: 65 mL/min — AB (ref >90)
GLUCOSE: 128 mg/dL — AB (ref 70–99)
Potassium: 4.5 mmol/L (ref 3.5–5.1)
SODIUM: 134 mmol/L — AB (ref 135–145)
TOTAL PROTEIN: 7.2 g/dL (ref 6.0–8.3)
Total Bilirubin: 0.3 mg/dL (ref 0.3–1.2)

## 2014-01-23 MED ORDER — CLINDAMYCIN HCL 300 MG PO CAPS
450.0000 mg | ORAL_CAPSULE | Freq: Once | ORAL | Status: AC
  Administered 2014-01-24: 450 mg via ORAL
  Filled 2014-01-23: qty 1

## 2014-01-23 MED ORDER — LIDOCAINE-EPINEPHRINE (PF) 2 %-1:200000 IJ SOLN
20.0000 mL | Freq: Once | INTRAMUSCULAR | Status: AC
  Administered 2014-01-24: 20 mL

## 2014-01-23 MED ORDER — CLINDAMYCIN HCL 150 MG PO CAPS: 450.0000 mg | ORAL_CAPSULE | Freq: Three times a day (TID) | ORAL | Status: DC

## 2014-01-23 MED ORDER — TETANUS-DIPHTH-ACELL PERTUSSIS 5-2.5-18.5 LF-MCG/0.5 IM SUSP
0.5000 mL | Freq: Once | INTRAMUSCULAR | Status: AC
  Administered 2014-01-23: 0.5 mL via INTRAMUSCULAR
  Filled 2014-01-23: qty 0.5

## 2014-01-23 MED ORDER — LIDOCAINE-EPINEPHRINE 2 %-1:100000 IJ SOLN
20.0000 mL | Freq: Once | INTRAMUSCULAR | Status: DC
Start: 2014-01-23 — End: 2014-01-23
  Filled 2014-01-23: qty 20

## 2014-01-23 MED ORDER — LIDOCAINE-EPINEPHRINE (PF) 2 %-1:200000 IJ SOLN
20.0000 mL | Freq: Once | INTRAMUSCULAR | Status: DC
  Filled 2014-01-23: qty 20

## 2014-01-23 NOTE — ED Notes (Signed)
EDP at bedside  

## 2014-01-23 NOTE — ED Notes (Addendum)
The patient was walking her dog and she said she got her foot tangled in something and fell.  She denies LOC, nausea, vomiting or any other symptoms other than a headache.  She rates her pain 8/10.  The patient denies being on blood thinners other than Aspirin.

## 2014-01-23 NOTE — Discharge Instructions (Signed)
Keep wound dry and do not remove dressing for 24 hours if possible. After that, wash gently morning and night (every 12 hours) with soap and water. Use a topical antibiotic ointment and cover with a bandaid or gauze.  °  °Do NOT use rubbing alcohol or hydrogen peroxide, do not soak the area °  °Present to your primary care doctor or the urgent care of your choice, or the ED for suture removal in 7-10 days. °  °Every attempt was made to remove foreign body (contaminants) from the wound.  However, there is always a chance that some may remain in the wound. This can  increase your risk of infection. °  °If you see signs of infection (warmth, redness, tenderness, pus, sharp increase in pain, fever, red streaking in the skin) immediately return to the emergency department. °  °After the wound heals fully, apply sunscreen for 6-12 months to minimize scarring.  ° °

## 2014-01-23 NOTE — ED Provider Notes (Signed)
CSN: [id number]     Arrival date & time 01/23/14  1908 History   First MD Initiated Contact with Patient 01/23/14 2109     Chief Complaint  Patient presents with  . Fall    The patient was walking her dog and she said she got her foot tangled in something and fell.  She denies LOC, nausea, vomiting or any other symptoms other than a headache.  . Facial Laceration     (Consider location/radiation/quality/duration/timing/severity/associated sxs/prior Treatment) HPI  [name] is a 81 y.o. female complaining of forehead laceration status post slip and fall several hours prior to arrival. Patient states she was walking her dog, the dog pulls her and she fell forward onto the concrete pavement. She hit the forehead. There was no loss of consciousness, no change in vision, no dysarthria, ataxia. On review of systems patient courses a mild cervicalgia with no weakness, numbness. She denies fever, chills, chest pain, abdominal pain. Last tetanus shot is unknown. No pain with eye movement.  Past Medical History  Diagnosis Date  . Anxiety disorder   . GERD (gastroesophageal reflux disease)   . COPD (chronic obstructive pulmonary disease)   . Depression   . Hyperlipidemia   . Peptic ulcer disease   . Pneumonia 2007    prolonged ICU stay on vent with trach  . Pulmonary embolism 2004    off anticoagulation  . Colon adenoma   . Pseudotumor cerebri   . Irritable bowel syndrome   . Lymphocytic colitis 12/15/2011  . Fall at home May 17, 2012    Pt. fell in tub today   Past Surgical History  Procedure Laterality Date  . Cholecystectomy    . Colon resection    . Abdominal hysterectomy    . Brain surgery      10 YEARS AGO   . Cataract extraction      BOTH EYES  . Upper gastrointestinal endoscopy    . Colonoscopy    . Lower extremity stents      Per Dr. Oneida Alar  . Abdominal aortagram N/A 08/10/2012    Procedure: ABDOMINAL Maxcine Ham;  Surgeon: Elam Dutch, MD;  Location: [facility] CATH LAB;   Service: Cardiovascular;  Laterality: N/A;  . Insertion of iliac stent Bilateral 08/10/2012    Procedure: INSERTION OF ILIAC STENT;  Surgeon: Elam Dutch, MD;  Location: [facility] CATH LAB;  Service: Cardiovascular;  Laterality: Bilateral;   Family History  Problem Relation Age of Onset  . Lung cancer Father   . Colon cancer Neg Hx   . Cancer Mother   . Hyperlipidemia Mother   . Hypertension Mother   . Heart attack Mother   . Hyperlipidemia Sister    History  Substance Use Topics  . Smoking status: Current Every Day Smoker -- 0.10 packs/day for 60 years    Types: Cigarettes  . Smokeless tobacco: Never Used     Comment: pt states "I will wait until all of this is over".  Heaviest   . Alcohol Use: No   OB History    No data available     Review of Systems  10 systems reviewed and found to be negative, except as noted in the HPI.   Allergies  Codeine; Penicillins; and Sulfa antibiotics  Home Medications   Prior to Admission medications   Medication Sig Start Date End Date Taking? Authorizing Provider  albuterol (PROVENTIL HFA;VENTOLIN HFA) 108 (90 BASE) MCG/ACT inhaler Inhale 2 puffs into the lungs every 4 (four)  hours as needed for wheezing or shortness of breath. 07/25/13   Tammy S Parrett, NP  ALPRAZolam Duanne Moron) 0.5 MG tablet TAKE  (1)  TABLET TWICE A DAY. 01/12/14   Mary-Margaret Hassell Done, FNP  aspirin EC 325 MG tablet Take 325 mg by mouth daily.    Historical Provider, MD  atorvastatin (LIPITOR) 40 MG tablet Take 1 tablet (40 mg total) by mouth daily. 10/24/13   Mary-Margaret Hassell Done, FNP  Cholecalciferol (VITAMIN D) 2000 UNITS tablet Take 2,000 Units by mouth daily.    Historical Provider, MD  clindamycin (CLEOCIN) 150 MG capsule Take 3 capsules (450 mg total) by mouth 3 (three) times daily. 01/23/14   Amiracle Neises, PA-C  colestipol (COLESTID) 1 G tablet TAKE (2) TABLETS TWICE DAILY. 01/22/14   Gatha Mayer, MD  CRANBERRY PO Take 1 tablet by mouth daily.    Historical  Provider, MD  Guaifenesin (MUCINEX MAXIMUM STRENGTH) 1200 MG TB12 Take 1,200 mg by mouth every morning.    Historical Provider, MD  oxybutynin (DITROPAN) 5 MG tablet Take 1 tablet (5 mg total) by mouth 2 (two) times daily. 05/03/13   Mary-Margaret Hassell Done, FNP  pantoprazole (PROTONIX) 40 MG tablet TAKE 1 TABLET DAILY 08/30/13   Mary-Margaret Hassell Done, FNP  raloxifene (EVISTA) 60 MG tablet Take 1 tablet (60 mg total) by mouth every other day. 09/20/12   Mary-Margaret Hassell Done, FNP  triamterene-hydrochlorothiazide (DYAZIDE) 37.5-25 MG per capsule Take 1 each (1 capsule total) by mouth daily. 06/10/13   Mary-Margaret Hassell Done, FNP  vitamin E 400 UNIT capsule Take 400 Units by mouth daily.    Historical Provider, MD   BP 122/50 mmHg  Pulse 62  Temp(Src) 98 F (36.7 C) (Oral)  Resp 16  SpO2 100% Physical Exam  Constitutional: She is oriented to person, place, and time. She appears well-developed and well-nourished. No distress.  HENT:  Head: Normocephalic.  Mouth/Throat: Oropharynx is clear and moist.  3 cm full-thickness irregular laceration to central forehead. To the left just above the eyebrow there is a 1 cm full-thickness laceration, no gross contamination, bleeding is controlled.  No hemotympanum, battle signs or raccoon's eyes  No crepitance or tenderness to palpation along the orbital rim.  EOMI intact with no pain or diplopia  ++Patient has dried blood in the right nares, no septal hematoma. Septum is midline.  No intraoral trauma.      Eyes: Conjunctivae and EOM are normal. Pupils are equal, round, and reactive to light.  Neck: Normal range of motion.  No midline C-spine  tenderness to palpation or step-offs appreciated. Patient has full range of motion without pain.   Cardiovascular: Normal rate, regular rhythm and intact distal pulses.   Pulmonary/Chest: Effort normal and breath sounds normal. No stridor. No respiratory distress. She has no wheezes. She has no rales. She exhibits  no tenderness.  Abdominal: Soft. Bowel sounds are normal. She exhibits no distension and no mass. There is no tenderness. There is no rebound and no guarding.  Musculoskeletal: Normal range of motion.  Neurological: She is alert and oriented to person, place, and time.  Follows commands, Clear, goal oriented speech, Strength is 5 out of 5x4 extremities, patient ambulates with a coordinated in nonantalgic gait. Sensation is grossly intact.   Psychiatric: She has a normal mood and affect.  Nursing note and vitals reviewed.   ED Course  LACERATION REPAIR Date/Time: 01/24/2014 12:19 AM Performed by: Monico Blitz Authorized by: Monico Blitz Consent: Verbal consent obtained. Consent given by: patient Patient identity confirmed:  verbally with patient Body area: head/neck Location details: forehead Laceration length: 3 cm Foreign bodies: no foreign bodies Anesthesia: local infiltration Local anesthetic: lidocaine 2% with epinephrine Anesthetic total: 5 ml Patient sedated: no Preparation: Patient was prepped and draped in the usual sterile fashion. Irrigation solution: saline Irrigation method: syringe Amount of cleaning: extensive Debridement: minimal Skin closure: Ethilon (5-0) Number of sutures: 4 Technique: running Approximation: close Approximation difficulty: complex Dressing: antibiotic ointment, 4x4 sterile gauze and tube gauze Patient tolerance: Patient tolerated the procedure well with no immediate complications    LACERATION REPAIR Date/Time: 01/24/2014 12:19 AM Performed by: Monico Blitz Authorized by: Monico Blitz Consent: Verbal consent obtained. Consent given by: patient Patient identity confirmed: verbally with patient Body area: head/neck Location details: forehead Laceration length: 1 cm Foreign bodies: no foreign bodies Anesthesia: local infiltration Local anesthetic: lidocaine 2% with epinephrine Anesthetic total: 2 ml Patient  sedated: no Preparation: Patient was prepped and draped in the usual sterile fashion. Irrigation solution: saline Irrigation method: syringe Amount of cleaning: extensive Debridement: minimal Skin closure: Ethilon (5-0) Number of sutures: 2 Technique: simple inrerupted Approximation: close Approximation difficulty: complex Dressing: antibiotic ointment, 4x4 sterile gauze and tube gauze Patient tolerance: Patient tolerated the procedure well with no immediate complications  Labs Review Labs Reviewed  CBC WITH DIFFERENTIAL - Abnormal; Notable for the following:    WBC 16.9 (*)    HCT 35.7 (*)    MCV 77.4 (*)    RDW 16.6 (*)    Platelets 446 (*)    Neutro Abs 12.5 (*)    Monocytes Absolute 1.3 (*)    All other components within normal limits  COMPREHENSIVE METABOLIC PANEL - Abnormal; Notable for the following:    Sodium 134 (*)    Glucose, Bld 128 (*)    GFR calc non Af Amer 65 (*)    GFR calc Af Amer 76 (*)    All other components within normal limits    Imaging Review Ct Head Wo Contrast  01/23/2014   CLINICAL DATA:  Fall with head injury and neck pain. The patient was walking her dog, and she said she got her foot tangled in something and fell.  EXAM: CT HEAD WITHOUT CONTRAST  CT CERVICAL SPINE WITHOUT CONTRAST  TECHNIQUE: Multidetector CT imaging of the head and cervical spine was performed following the standard protocol without intravenous contrast. Multiplanar CT image reconstructions of the cervical spine were also generated.  COMPARISON:  CT face 06/19/2013. Remote CT head 11/18/2004. Chest CT earlier this day.  FINDINGS: CT HEAD FINDINGS  Ventriculomegaly, minimally progressed compared to remote prior exam. There is encephalomalacia in the right frontal lobe that is new. No intracranial hemorrhage, mass effect, or midline shift. No hydrocephalus. The basilar cisterns are patent. No evidence of territorial infarct. No subdural or epidural fluid collection. A right frontal  burr hole is again seen. There is no calvarial fracture. Small amount of air in the anterior midline and left scalp from tissues consistent with laceration. Mucosal thickening of fluid level in the right maxillary sinus. Mastoid air cells are well aerated.  CT CERVICAL SPINE FINDINGS  There is extensive multilevel degenerative change. There is diffuse disc space narrowing with near complete bony fusion at C3-C4. Posterior disc osteophyte complex at C4-C5, C5-C6, and C6-C7. There is mild anterolisthesis of C2 on C3 as well as C4 on C5. The dens is intact. There is no acute fracture. Multilevel facet arthropathy throughout. No jumped or perched facets. Combination of degenerative disc disease  and facet arthropathy causes narrowing of the spinal canal at C4-C5 and C5-C6. No prevertebral soft tissue edema. Carotid vascular calcifications are seen. There is biapical pleural parenchymal thickening.  IMPRESSION: 1. Ventriculomegaly, minimally progressed from remote prior exam. No acute intracranial abnormality. 2. Presumed anterior scalp laceration/soft tissue injury. No calvarial fracture. Mucosal thickening and fluid level in the right maxillary sinus, may reflect sinusitis. Paranasal sinus fracture is not seen where included. 3. Extensive multilevel degenerative change throughout the cervical spine with degenerative disc disease and facet arthropathy. There is no acute fracture. There is spinal canal narrowing at C4-C5 and C5-C6 that is degenerative in etiology.   Electronically Signed   By: Jeb Levering M.D.   On: 01/23/2014 22:19   Ct Chest Wo Contrast  01/23/2014   CLINICAL DATA:  Routine follow-up of pulmonary nodules.  EXAM: CT CHEST WITHOUT CONTRAST  TECHNIQUE: Multidetector CT imaging of the chest was performed following the standard protocol without IV contrast.  COMPARISON:  10/03/2013.  FINDINGS: Mediastinum: The heart size is normal. No pericardial effusion. There is calcified atherosclerotic disease  involving the thoracic aorta as well as the LAD, RCA and left circumflex coronary arteries. 9 mm right paratracheal lymph node is noted, stable. The trachea is patent and appears midline. Unremarkable appearance of the esophagus.  Lungs/Pleura: No pleural effusions. Moderate to advanced changes of centrilobular emphysema identified. Persistent spiculated nodules within the superior segment of left lower lobe. Best seen on the coronal image this measures 1.4 cm, image 66 of series 602. This is unchanged from previous exam. Adjacent nodule is also persistent and unchanged measuring 7 mm. Within the inferior left lower lobe there is diffuse of bronchial wall thickening and scattered tree-in-bud nodules compatible with bronchiolitis. Previously noted tree-in-bud nodularity and is sub segmental consolidation/ atelectasis in the right lower lobe has improved.  Upper Abdomen: The visualized portions of the liver are remarkable for a the visualized portions of the spleen and adrenal glands are unremarkable. Cyst in the left lobe of liver measuring 3.1 cm.  Musculoskeletal: No aggressive lytic or sclerotic bone lesions identified. There is a scoliosis deformity involving the thoracic spine which is convex towards the right.  IMPRESSION: 1. Persistent aggressive appearing nodule within the superior segment of left lower lobe. This remains concerning for primary lung carcinoma. Further evaluation with PET-CT and/ or biopsy is recommended at this time. 2. Improved aeration to the right lower lobe. 3. Tree-in-bud nodularity within the left lower lobe consistent with residual infectious or inflammatory bronchiolitis. 4. Diffuse bronchial wall thickening with emphysema, as above; imaging findings suggestive of underlying COPD. 5. Atherosclerotic disease including 3 vessel coronary artery calcifications.   Electronically Signed   By: Kerby Moors M.D.   On: 01/23/2014 14:00   Ct Cervical Spine Wo Contrast  01/23/2014    CLINICAL DATA:  Fall with head injury and neck pain. The patient was walking her dog, and she said she got her foot tangled in something and fell.  EXAM: CT HEAD WITHOUT CONTRAST  CT CERVICAL SPINE WITHOUT CONTRAST  TECHNIQUE: Multidetector CT imaging of the head and cervical spine was performed following the standard protocol without intravenous contrast. Multiplanar CT image reconstructions of the cervical spine were also generated.  COMPARISON:  CT face 06/19/2013. Remote CT head 11/18/2004. Chest CT earlier this day.  FINDINGS: CT HEAD FINDINGS  Ventriculomegaly, minimally progressed compared to remote prior exam. There is encephalomalacia in the right frontal lobe that is new. No intracranial hemorrhage, mass effect, or  midline shift. No hydrocephalus. The basilar cisterns are patent. No evidence of territorial infarct. No subdural or epidural fluid collection. A right frontal burr hole is again seen. There is no calvarial fracture. Small amount of air in the anterior midline and left scalp from tissues consistent with laceration. Mucosal thickening of fluid level in the right maxillary sinus. Mastoid air cells are well aerated.  CT CERVICAL SPINE FINDINGS  There is extensive multilevel degenerative change. There is diffuse disc space narrowing with near complete bony fusion at C3-C4. Posterior disc osteophyte complex at C4-C5, C5-C6, and C6-C7. There is mild anterolisthesis of C2 on C3 as well as C4 on C5. The dens is intact. There is no acute fracture. Multilevel facet arthropathy throughout. No jumped or perched facets. Combination of degenerative disc disease and facet arthropathy causes narrowing of the spinal canal at C4-C5 and C5-C6. No prevertebral soft tissue edema. Carotid vascular calcifications are seen. There is biapical pleural parenchymal thickening.  IMPRESSION: 1. Ventriculomegaly, minimally progressed from remote prior exam. No acute intracranial abnormality. 2. Presumed anterior scalp  laceration/soft tissue injury. No calvarial fracture. Mucosal thickening and fluid level in the right maxillary sinus, may reflect sinusitis. Paranasal sinus fracture is not seen where included. 3. Extensive multilevel degenerative change throughout the cervical spine with degenerative disc disease and facet arthropathy. There is no acute fracture. There is spinal canal narrowing at C4-C5 and C5-C6 that is degenerative in etiology.   Electronically Signed   By: Jeb Levering M.D.   On: 01/23/2014 22:19     EKG Interpretation None      MDM   Final diagnoses:  Fall  Facial laceration, initial encounter    Filed Vitals:   01/23/14 1916 01/23/14 2138 01/23/14 2300 01/24/14 0013  BP: 136/56 122/46 121/47 122/50  Pulse: 101 93  62  Temp: 98 F (36.7 C)     TempSrc: Oral     Resp: 18 16  16   SpO2: 99% 100%  100%    Medications  clindamycin (CLEOCIN) capsule 450 mg (not administered)  HYDROcodone-acetaminophen (NORCO/VICODIN) 5-325 MG per tablet 1 tablet (not administered)  Tdap (BOOSTRIX) injection 0.5 mL (0.5 mLs Intramuscular Given 01/23/14 2119)  lidocaine-EPINEPHrine (XYLOCAINE W/EPI) 2 %-1:200000 (PF) injection 20 mL (20 mLs Infiltration Given by Other 01/24/14 0012)    [name] is a pleasant 81 y.o. female presenting with frontal head trauma status post mechanical fall just prior to arrival. Neuro exam is nonfocal. Patient is not on any blood thinners. Head CT and cervical spine CT is without acute findings. Wounds irrigated and closed. Tetanus is updated. Will start her on clindamycin. Counseled patient and her daughter on wound care.  Patient has anaphylactic reaction to codeine however her and her daughter both state that she has had hydrocodone in the past with no adverse reaction.  This is a shared visit with the attending physician who personally evaluated the patient and agrees with the care plan.   Evaluation does not show pathology that would require ongoing  emergent intervention or inpatient treatment. Pt is hemodynamically stable and mentating appropriately. Discussed findings and plan with patient/guardian, who agrees with care plan. All questions answered. Return precautions discussed and outpatient follow up given.   New Prescriptions   CLINDAMYCIN (CLEOCIN) 150 MG CAPSULE    Take 3 capsules (450 mg total) by mouth 3 (three) times daily.         Monico Blitz, PA-C 01/24/14 0023  Debby Freiberg, MD 01/24/14 [phone]

## 2014-01-24 ENCOUNTER — Telehealth: Payer: Self-pay | Admitting: Pulmonary Disease

## 2014-01-24 DIAGNOSIS — R911 Solitary pulmonary nodule: Secondary | ICD-10-CM

## 2014-01-24 DIAGNOSIS — S0181XA Laceration without foreign body of other part of head, initial encounter: Secondary | ICD-10-CM | POA: Diagnosis not present

## 2014-01-24 MED ORDER — HYDROCODONE-ACETAMINOPHEN 5-325 MG PO TABS
1.0000 | ORAL_TABLET | Freq: Once | ORAL | Status: AC
  Administered 2014-01-24: 1 via ORAL
  Filled 2014-01-24: qty 1

## 2014-01-24 NOTE — Telephone Encounter (Signed)
Notes Recorded by Rigoberto Noel, MD on 01/23/2014 at 4:42 PM Nodule is still present Proceed with PEt scan & will review during OV Also, pl ask [facility] to [place] super D images ---  Called spoke with Tye [place] pt daughter and POA. Aware of results. PET ordered. [facility] have you been told RA wants super D images? thanks

## 2014-01-24 NOTE — Telephone Encounter (Signed)
Super d ordered from [facility]

## 2014-01-30 ENCOUNTER — Ambulatory Visit (INDEPENDENT_AMBULATORY_CARE_PROVIDER_SITE_OTHER): Payer: Medicare Other | Admitting: Nurse Practitioner

## 2014-01-30 ENCOUNTER — Encounter: Payer: Self-pay | Admitting: Nurse Practitioner

## 2014-01-30 VITALS — BP 133/52 | HR 66 | Temp 96.5°F | Ht 65.0 in | Wt 99.0 lb

## 2014-01-30 DIAGNOSIS — Z4802 Encounter for removal of sutures: Secondary | ICD-10-CM

## 2014-01-30 NOTE — Progress Notes (Signed)
   Subjective:    Patient ID: [name], female    DOB: 07/10/1932, 81 y.o.   MRN: [id number]  HPI Patient fell 5 days ago while walking her dog- Not sure what happened because patient does not remember anything- She says that she laid where she fell for over 30 minutues before someone found her. She went to ER and multiple tests were ranSHe hit her face and has several lacerations that had to be sewn up in ER. SHe is hear today for stitch removal.Says that she feels ok- still sore all over with lots of bruising. Denies headache, nausea or vomiting.    Review of Systems  Constitutional: Negative.   Respiratory: Negative.   Cardiovascular: Negative.   Gastrointestinal: Negative.   Genitourinary: Negative.   Neurological: Negative.   Psychiatric/Behavioral: Negative.   All other systems reviewed and are negative.      Objective:   Physical Exam  Constitutional: She is oriented to person, place, and time. She appears well-developed and well-nourished. No distress.  Cardiovascular: Normal rate, regular rhythm and normal heart sounds.   Pulmonary/Chest: Effort normal.  Neurological: She is alert and oriented to person, place, and time. She has normal reflexes. No cranial nerve deficit.  Skin: Skin is warm.  echymosis all over face mild edema left cheek bone  Psychiatric: She has a normal mood and affect. Her behavior is normal. Judgment and thought content normal.   BP 133/52 mmHg  Pulse 66  Temp(Src) 96.5 F (35.8 C) (Oral)  Ht 5\' 5"  (1.651 m)  Wt 99 lb (44.906 kg)  BMI 16.47 kg/m2        Assessment & Plan:   1. Visit for suture removal    Keep wounds clean and dry Clean with antibacteril soap daily and apply antibiotic ointment  Mary-Margaret Hassell Done, FNP

## 2014-01-30 NOTE — Patient Instructions (Signed)
Facial Laceration  A facial laceration is a cut on the face. These injuries can be painful and cause bleeding. Lacerations usually heal quickly, but they need special care to reduce scarring. DIAGNOSIS  Your health care provider will take a medical history, ask for details about how the injury occurred, and examine the wound to determine how deep the cut is. TREATMENT  Some facial lacerations may not require closure. Others may not be able to be closed because of an increased risk of infection. The risk of infection and the chance for successful closure will depend on various factors, including the amount of time since the injury occurred. The wound may be cleaned to help prevent infection. If closure is appropriate, pain medicines may be given if needed. Your health care provider will use stitches (sutures), wound glue (adhesive), or skin adhesive strips to repair the laceration. These tools bring the skin edges together to allow for faster healing and a better cosmetic outcome. If needed, you may also be given a tetanus shot. HOME CARE INSTRUCTIONS  Only take over-the-counter or prescription medicines as directed by your health care provider.  Follow your health care provider's instructions for wound care. These instructions will vary depending on the technique used for closing the wound. For Sutures:  Keep the wound clean and dry.   If you were given a bandage (dressing), you should change it at least once a day. Also change the dressing if it becomes wet or dirty, or as directed by your health care provider.   Wash the wound with soap and water 2 times a day. Rinse the wound off with water to remove all soap. Pat the wound dry with a clean towel.   After cleaning, apply a thin layer of the antibiotic ointment recommended by your health care provider. This will help prevent infection and keep the dressing from sticking.   You may shower as usual after the first 24 hours. Do not soak the  wound in water until the sutures are removed.   Get your sutures removed as directed by your health care provider. With facial lacerations, sutures should usually be taken out after 4-5 days to avoid stitch marks.   Wait a few days after your sutures are removed before applying any makeup. For Skin Adhesive Strips:  Keep the wound clean and dry.   Do not get the skin adhesive strips wet. You may bathe carefully, using caution to keep the wound dry.   If the wound gets wet, pat it dry with a clean towel.   Skin adhesive strips will fall off on their own. You may trim the strips as the wound heals. Do not remove skin adhesive strips that are still stuck to the wound. They will fall off in time.  For Wound Adhesive:  You may briefly wet your wound in the shower or bath. Do not soak or scrub the wound. Do not swim. Avoid periods of heavy sweating until the skin adhesive has fallen off on its own. After showering or bathing, gently pat the wound dry with a clean towel.   Do not apply liquid medicine, cream medicine, ointment medicine, or makeup to your wound while the skin adhesive is in place. This may loosen the film before your wound is healed.   If a dressing is placed over the wound, be careful not to apply tape directly over the skin adhesive. This may cause the adhesive to be pulled off before the wound is healed.   Avoid   prolonged exposure to sunlight or tanning lamps while the skin adhesive is in place.  The skin adhesive will usually remain in place for 5-10 days, then naturally fall off the skin. Do not pick at the adhesive film.  After Healing: Once the wound has healed, cover the wound with sunscreen during the day for 1 full year. This can help minimize scarring. Exposure to ultraviolet light in the first year will darken the scar. It can take 1-2 years for the scar to lose its redness and to heal completely.  SEEK IMMEDIATE MEDICAL CARE IF:  You have redness, pain, or  swelling around the wound.   You see ayellowish-white fluid (pus) coming from the wound.   You have chills or a fever.  MAKE SURE YOU:  Understand these instructions.  Will watch your condition.  Will get help right away if you are not doing well or get worse. Document Released: 02/04/2004 Document Revised: 10/17/2012 Document Reviewed: 08/09/2012 Exit[employer] Patient Information 2015 [employer]. This information is not intended to replace advice given to you by your health care provider. Make sure you discuss any questions you have with your health care provider.  

## 2014-02-03 ENCOUNTER — Ambulatory Visit: Payer: Medicare Other | Admitting: Pulmonary Disease

## 2014-02-04 ENCOUNTER — Ambulatory Visit (HOSPITAL_COMMUNITY)
Admission: RE | Admit: 2014-02-04 | Discharge: 2014-02-04 | Disposition: A | Discharge status: Home / Self Care | Payer: Medicare Other | Source: Ambulatory Visit | Attending: Pulmonary Disease | Admitting: Pulmonary Disease

## 2014-02-04 ENCOUNTER — Encounter (HOSPITAL_COMMUNITY): Payer: Self-pay

## 2014-02-04 DIAGNOSIS — J439 Emphysema, unspecified: Secondary | ICD-10-CM | POA: Insufficient documentation

## 2014-02-04 DIAGNOSIS — J849 Interstitial pulmonary disease, unspecified: Secondary | ICD-10-CM | POA: Diagnosis not present

## 2014-02-04 DIAGNOSIS — R911 Solitary pulmonary nodule: Secondary | ICD-10-CM | POA: Insufficient documentation

## 2014-02-04 LAB — GLUCOSE, CAPILLARY: GLUCOSE-CAPILLARY: 87 mg/dL (ref 70–99)

## 2014-02-04 MED ORDER — FLUDEOXYGLUCOSE F - 18 (FDG) INJECTION
4.9400 | Freq: Once | INTRAVENOUS | Status: AC | PRN
  Administered 2014-02-04: 4.94 via INTRAVENOUS

## 2014-02-05 ENCOUNTER — Ambulatory Visit (INDEPENDENT_AMBULATORY_CARE_PROVIDER_SITE_OTHER): Payer: Medicare Other | Admitting: Pulmonary Disease

## 2014-02-05 ENCOUNTER — Other Ambulatory Visit (INDEPENDENT_AMBULATORY_CARE_PROVIDER_SITE_OTHER): Payer: Medicare Other

## 2014-02-05 ENCOUNTER — Encounter: Payer: Self-pay | Admitting: Pulmonary Disease

## 2014-02-05 VITALS — BP 120/64 | HR 84 | Ht 65.0 in | Wt 103.4 lb

## 2014-02-05 DIAGNOSIS — R911 Solitary pulmonary nodule: Secondary | ICD-10-CM

## 2014-02-05 DIAGNOSIS — J849 Interstitial pulmonary disease, unspecified: Secondary | ICD-10-CM

## 2014-02-05 LAB — CBC WITH DIFFERENTIAL/PLATELET
BASOS ABS: 0.2 10*3/uL — AB (ref 0.0–0.1)
BASOS PCT: 1.2 % (ref 0.0–3.0)
Eosinophils Absolute: 0.5 10*3/uL (ref 0.0–0.7)
Eosinophils Relative: 3.9 % (ref 0.0–5.0)
HEMATOCRIT: 35.7 % — AB (ref 36.0–46.0)
Hemoglobin: 11.9 g/dL — ABNORMAL LOW (ref 12.0–15.0)
LYMPHS ABS: 3 10*3/uL (ref 0.7–4.0)
LYMPHS PCT: 21.8 % (ref 12.0–46.0)
MCHC: 33.4 g/dL (ref 30.0–36.0)
MCV: 77.7 fl — AB (ref 78.0–100.0)
MONO ABS: 0.9 10*3/uL (ref 0.1–1.0)
MONOS PCT: 6.3 % (ref 3.0–12.0)
Neutro Abs: 9.2 10*3/uL — ABNORMAL HIGH (ref 1.4–7.7)
Neutrophils Relative %: 66.8 % (ref 43.0–77.0)
PLATELETS: 515 10*3/uL — AB (ref 150.0–400.0)
RBC: 4.59 Mil/uL (ref 3.87–5.11)
RDW: 17.6 % — AB (ref 11.5–15.5)
WBC: 13.8 10*3/uL — ABNORMAL HIGH (ref 4.0–10.5)

## 2014-02-05 NOTE — Patient Instructions (Signed)
CBC check today We discussed possibilities with lung nodule that is positive on PET scan We discussed biopsy procedures & surgery Call me back in 1 week to report

## 2014-02-05 NOTE — Progress Notes (Signed)
   Subjective:    Patient ID: [name], female    DOB: [birth year], 81 y.o.   MRN: [id number]  HPI  81 year old smoker for FU of mild COPD & pulmonary nodule  PMh/o COPD, remote PE, colonic adenoma and lymphocytic colitis,  2007 - H/o left empyema requiring thoracotomy, complicated by bleeding & PMV , tstomy  Admitted 6/10 -06/25/13 w/ cc: weakness,falls , cough , hypoxia & BL infiltrates, wbc CT 23.6. Tx w/ IV abx , steroids and nebs. Discharged on O2 ,Symbicort   02/05/2014 Chief Complaint  Patient presents with  . Follow-up    PET scan results.   Accompanied by friend and daughter, [name] who is an occupational nurse  off o2  Smokes 4-5 cigs/d Breathing ok, off symbicort  she lived with her daughter for 6 months and just moved back home -she had a fall , required ER visit and sutures for lacerated wound on her head .  Significant tests/ events  CT chest 06/29/13: New extensive interstitial lung disease primarily in the upper lobes with progressive bronchiectasis at the right base and new extensive bronchiectasis at the left base. And New hilar and mediastinal adenopathy- compared to CT from 2010.  Sputum AFB neg  Workup showed -ACE 48, RA mild pos, ESR 30, ANA positive 1:40  PFTs  08/2013 - FVC 96%, TLC 95%, ratio 74, no obstruction, DLCO 43% -10.3  CT 10/03/13 -aggressive appearing 1.5 x 1.2 x 1.5 cm nodule with spiculated margins the causes retraction of the adjacent left major fissure CT chest 01/23/14 - unchanged spiculated left lower lobe (sup seg) nodule  PET  01/2014-hypermetabolic nodule, no LNs light up  Past Medical History  Diagnosis Date  . Anxiety disorder   . GERD (gastroesophageal reflux disease)   . COPD (chronic obstructive pulmonary disease)   . Depression   . Hyperlipidemia   . Peptic ulcer disease   . Pneumonia 2007    prolonged ICU stay on vent with trach  . Pulmonary embolism 2004    off anticoagulation  . Colon adenoma   . Pseudotumor  cerebri   . Irritable bowel syndrome   . Lymphocytic colitis 12/15/2011  . Fall at home May 17, 2012    Pt. fell in tub today     Review of Systems neg for any significant sore throat, dysphagia, itching, sneezing, nasal congestion or excess/ purulent secretions, fever, chills, sweats, unintended wt loss, pleuritic or exertional cp, hempoptysis, orthopnea pnd or change in chronic leg swelling. Also denies presyncope, palpitations, heartburn, abdominal pain, nausea, vomiting, diarrhea or change in bowel or urinary habits, dysuria,hematuria, rash, arthralgias, visual complaints, headache, numbness weakness or ataxia.     Objective:   Physical Exam  Gen. Pleasant, thin, in no distress ENT - no lesions, no post nasal drip Neck: No JVD, no thyromegaly, no carotid bruits Lungs: no use of accessory muscles, no dullness to percussion, decreased without rales or rhonchi  Cardiovascular: Rhythm regular, heart sounds  normal, no murmurs or gallops, no peripheral edema Musculoskeletal: No deformities, no cyanosis or clubbing         Assessment & Plan:

## 2014-02-06 ENCOUNTER — Telehealth: Payer: Self-pay | Admitting: Pulmonary Disease

## 2014-02-06 NOTE — Assessment & Plan Note (Signed)
We discussed high probably of malignancy -her daughter was present We discussed options- although this may be resectable, her lung function is poor as evidenced by a DLCO of 43%. She had a very difficult course in 2007 when she required thoracotomy and she is not keen on surgery again. We discussed possible SBRT - this would require prior diagnosis with a biopsy. We discussed options for biopsy including  navigation bronchoscopy -this would require deep anesthesia or CT-guided needle aspiration-this would almost certainly result in a pneumothorax, given her severe emphysema We also discussed the watch and wait option - repeat CT scan in 3 months She will discuss with her daughter some more, and get back to [place]. But she is really not keen on surgery.

## 2014-02-06 NOTE — Telephone Encounter (Signed)
Result Note     WBC count coming down  --  I spoke with patient about results and she verbalized understanding and had no questions

## 2014-02-17 ENCOUNTER — Other Ambulatory Visit: Payer: Self-pay | Admitting: Nurse Practitioner

## 2014-02-18 NOTE — Telephone Encounter (Signed)
Last seen 01/30/14 MMM If approved route to nurse to call into [facility]

## 2014-03-05 ENCOUNTER — Encounter: Payer: Self-pay | Admitting: Vascular Surgery

## 2014-03-06 ENCOUNTER — Encounter: Payer: Self-pay | Admitting: Vascular Surgery

## 2014-03-06 ENCOUNTER — Ambulatory Visit (HOSPITAL_COMMUNITY)
Admission: RE | Admit: 2014-03-06 | Discharge: 2014-03-06 | Disposition: A | Discharge status: Home / Self Care | Payer: Medicare Other | Source: Ambulatory Visit | Attending: Vascular Surgery | Admitting: Vascular Surgery

## 2014-03-06 ENCOUNTER — Ambulatory Visit (INDEPENDENT_AMBULATORY_CARE_PROVIDER_SITE_OTHER): Payer: Medicare Other | Admitting: Vascular Surgery

## 2014-03-06 VITALS — BP 148/54 | HR 64 | Ht 65.0 in | Wt 107.0 lb

## 2014-03-06 DIAGNOSIS — I739 Peripheral vascular disease, unspecified: Secondary | ICD-10-CM

## 2014-03-06 NOTE — Addendum Note (Signed)
Addended by: Mena Goes on: 03/06/2014 04:40 PM   Modules accepted: Orders

## 2014-03-06 NOTE — Progress Notes (Signed)
[facility] Progress Note  Subjective: Patient is here for f/u from bilateral external iliac stenting (08/2012). Her shortness of breath usually interferes with exercise first. She is able to walk half a block before she is short of breath. She denies any rest pain at night. She currently is able to ambulate as much as she wishes. She has fairly significant pulmonary dysfunction and required hospitalization recently for this. She apparently was noted to have enlarged mediastinal lymph nodes and is scheduled for followup chest CT scan. She is followed by Dr. Elsworth Soho with pulmonary.  This is presumably a lung cancer.     Physical Exam:     . Filed Vitals:   03/06/14 1407  BP: 148/54  Pulse: 64  Height: 5\' 5"  (1.651 m)  Weight: 107 lb (48.535 kg)  SpO2: 100%    Pulses: palpable femoral pulses bilaterally. Palpable left popliteal pulse and palpable left DP/PT pulses. Right popliteal, DP and PT pulses non-palpable. Extremities: Feet slightly cool to touch. No ulcerations.    ABIs 02/21/13: Right- 0.65, Left -0.89.  The patient had bilateral ABIs performed today as well as an arterial duplex scan. This shows a chronic right superficial femoral artery occlusion. Right ABI is 0.63. Left ABI was 1.01. Monophasic waveforms on the right triphasic waveforms in the left   Assessment:  81 y.o. female is s/p bilateral external iliac stenting 08/2012 currently asymptomatic  Plan: -Follow-up in 6 months for ABIs  Ruta Hinds, MD [facility] [place]: [phone] Pager: [phone]

## 2014-03-19 ENCOUNTER — Ambulatory Visit: Payer: Medicare Other | Admitting: Pulmonary Disease

## 2014-03-20 ENCOUNTER — Encounter: Payer: Self-pay | Admitting: Pulmonary Disease

## 2014-03-20 ENCOUNTER — Ambulatory Visit (INDEPENDENT_AMBULATORY_CARE_PROVIDER_SITE_OTHER): Payer: Medicare Other | Admitting: Pulmonary Disease

## 2014-03-20 VITALS — BP 118/78 | HR 70 | Temp 98.0°F | Wt 112.0 lb

## 2014-03-20 DIAGNOSIS — R911 Solitary pulmonary nodule: Secondary | ICD-10-CM

## 2014-03-20 NOTE — Assessment & Plan Note (Addendum)
Appears T1N0 - not a candidate for surgery- she had significant complications in [id number] after surgery for evacuation of pleural effusion She also has a very poor baseline with frequent falls-  - not keen on biopsy either -she is not willing to take risk of pneumothorax - will refer to rad Onc for empiric radiation based on PET Discussed risks & benefits

## 2014-03-20 NOTE — Progress Notes (Signed)
   Subjective:    Patient ID: [name], female    DOB: 04-05-1932, 81 y.o.   MRN: [id number]  HPI   81 year old smoker for FU of mild COPD & pulmonary nodule  PMh/o COPD, remote PE, colonic adenoma and lymphocytic colitis,  2007 - H/o left empyema requiring thoracotomy, complicated by bleeding & PMV , tstomy      03/20/2014  Chief Complaint  Patient presents with  . Follow-up    COPD; SOB at baseline; cough only when sinus drainage   Accompanied by friend and daughter, [name] who is an occupational nurse  off o2  Smokes 5-10 cigs/d Breathing ok, off symbicort  Admitted 06/2013 w/ cc: weakness,falls , cough , hypoxia & BL infiltrates, wbc CT 23.6. Tx w/ IV abx , steroids and nebs. Discharged on O2 ,Symbicort  she lived with her daughter for 6 months and just moved back home -she had a fall , required ER visit and sutures for lacerated wound on her head .  Significant tests/ events  CT chest 06/29/13: New extensive interstitial lung disease primarily in the upper lobes with progressive bronchiectasis at the right base and new extensive bronchiectasis at the left base. And New hilar and mediastinal adenopathy- compared to CT from 2010.  Sputum AFB neg  Workup showed -ACE 48, RA mild pos, ESR 30, ANA positive 1:40  PFTs  08/2013 - FVC 96%, TLC 95%, ratio 74, no obstruction, DLCO 43% -10.3  CT 10/03/13 -aggressive appearing 1.5 x 1.2 x 1.5 cm nodule with spiculated margins the causes retraction of the adjacent left major fissure CT chest 01/23/14 - unchanged spiculated left lower lobe (sup seg) nodule  PET  01/2014-hypermetabolic nodule, no LNs light up  Past Medical History  Diagnosis Date  . Anxiety disorder   . GERD (gastroesophageal reflux disease)   . COPD (chronic obstructive pulmonary disease)   . Depression   . Hyperlipidemia   . Peptic ulcer disease   . Pneumonia 2007    prolonged ICU stay on vent with trach  . Pulmonary embolism 2004    off anticoagulation    . Colon adenoma   . Pseudotumor cerebri   . Irritable bowel syndrome   . Lymphocytic colitis 12/15/2011  . Fall at home May 17, 2012    Pt. fell in tub today  . Cancer     lung      Review of Systems  neg for any significant sore throat, dysphagia, itching, sneezing, nasal congestion or excess/ purulent secretions, fever, chills, sweats, unintended wt loss, pleuritic or exertional cp, hempoptysis, orthopnea pnd or change in chronic leg swelling. Also denies presyncope, palpitations, heartburn, abdominal pain, nausea, vomiting, diarrhea or change in bowel or urinary habits, dysuria,hematuria, rash, arthralgias, visual complaints, headache, numbness weakness or ataxia.     Objective:   Physical Exam   Gen. Pleasant, thin, in no distress ENT - no lesions, no post nasal drip Neck: No JVD, no thyromegaly, no carotid bruits Lungs: no use of accessory muscles, no dullness to percussion, decreased without rales or rhonchi  Cardiovascular: Rhythm regular, heart sounds  normal, no murmurs or gallops, no peripheral edema Musculoskeletal: No deformities, no cyanosis or clubbing         Assessment & Plan:

## 2014-03-20 NOTE — Patient Instructions (Signed)
Referral to radiation Oncology

## 2014-03-26 ENCOUNTER — Other Ambulatory Visit: Payer: Self-pay | Admitting: Nurse Practitioner

## 2014-03-26 NOTE — Telephone Encounter (Signed)
Patient last seen in office on 01-30-14. Rx last filled on 02-18-14 for #60. Please advise

## 2014-03-27 NOTE — Telephone Encounter (Signed)
Refilled called to pharmacy

## 2014-03-27 NOTE — Telephone Encounter (Signed)
Please call in xanax with 1 refills 

## 2014-04-02 NOTE — Progress Notes (Signed)
Thoracic Location of Tumor / Histology:Left lower lobe carcinoma  Patient presented with spot on lung in June 2015 which has been followed closely with PET.   Biopsies of   Tobacco/Marijuana/Snuff/ETOH OMB:TDHRC 2 to 3 cigarettes/day  Past/Anticipated interventions by cardiothoracic surgery, if any:   Past/Anticipated interventions by medical oncology, if any:?  Signs/Symptoms  Weight changes, if any: 15 to 20 lb weight loss over the last year  Respiratory complaints, if any: COPD, shortness unchanged over the years  Hemoptysis, if any:No   Pain issues, if any:    SAFETY ISSUES:  Prior radiation? NO  Pacemaker/ICD? NO  Possible current pregnancy?No  Is the patient on methotrexate? No  Current Complaints / other details:From [place] with daughter and friend. Allergies:codeine,penicillin and sulfa

## 2014-04-03 ENCOUNTER — Ambulatory Visit
Admission: RE | Admit: 2014-04-03 | Discharge: 2014-04-03 | Disposition: A | Discharge status: Home / Self Care | Payer: Medicare Other | Source: Ambulatory Visit | Attending: Radiation Oncology | Admitting: Radiation Oncology

## 2014-04-03 VITALS — BP 129/58 | HR 75 | Temp 97.8°F | Resp 20 | Wt 109.4 lb

## 2014-04-03 DIAGNOSIS — C3432 Malignant neoplasm of lower lobe, left bronchus or lung: Secondary | ICD-10-CM | POA: Diagnosis not present

## 2014-04-03 DIAGNOSIS — Z720 Tobacco use: Secondary | ICD-10-CM | POA: Insufficient documentation

## 2014-04-03 NOTE — Progress Notes (Signed)
Please see the Nurse Progress Note in the MD Initial Consult Encounter for this patient. 

## 2014-04-03 NOTE — Progress Notes (Signed)
  Radiation Oncology         [phone]) [phone] ________________________________  Initial Outpatient Consultation - Date: 04/03/2014   Name: [name] MRN: [id number]   DOB: [birth year]  REFERRING PHYSICIAN: Rigoberto Noel, MD  DIAGNOSIS:    ICD-9-CM ICD-10-CM   1. Primary malignant neoplasm of bronchus of left lower lobe 162.5 C34.32    HISTORY OF PRESENT ILLNESS::[name] is a 81 y.o. female who was hospitalized in June of last year and was found to have a nodule in her left lung. She had a follow up scan which showed this was slightly larger. A PET scan in January showed an SUV of 7.7 in this 1.3 cm mass. No evidence of metastatic disease was noted. She is not felt ot be a candidate for surgery or even a biopsy due to her previous complications with a empyema surgery many years ago. She is accompanied by her friend and daughter. She has stable shortness of breath. She has lost 20 pounds over the past year due to her hospitalization and "worrying." She continues to smoke 2-3 cigarettes per day.    PREVIOUS RADIATION THERAPY: No  Past medical, social and family history were reviewed in the electronic chart. Review of symptoms was reviewed in the electronic chart. Medications were reviewed in the electronic chart.   PHYSICAL EXAM:  Filed Vitals:   04/03/14 0939  BP: 129/58  Pulse: 75  Temp: 97.8 F (36.6 C)  Resp: 20  .109 lb 6.4 oz (49.624 kg). Thin female. Distant breath sounds. Alert and oriented.   IMPRESSION: Presumed T1N0 NSCLC of the left lower lobe.  PLAN: We discussed that it is possible these areas are not intact cancer and are things such as inflammation or infection. We discussed that if that there is probably a 1 in 20 to 1 in 10 chance of these are things other than cancer. Only a biopsy would tell us if this was cancer. That being said there is increasing evidence in patients who have the correct clinical history which she certainly does that SBRT to be effective in  treating these nodules which are likely non-small cell lung cancer. We discussed that he has stage I lung cancer and the results of SBRT with provide a greater than 90% local control. We discussed that she may fail in the mediastinum, hilum or elsewhere in the body and radiation was a local only process. We discussed the process of simulation and the use of a pad all to decrease respiratory motion. We discussed the use of 4-dimensional simulation to minimize normal lung tissue treated. We discussed 3 treatments occurring every other day as an outpatient. We discussed these treatments will last about 10-20 minutes and she would be here at the hospital for about an hour. We discussed that SBRT was unlikely to make his breathing any worse. It is also unlikely to make her breathing symptoms any better. We discussed possible side effects including shoulder pain due to arm positioning and possible rib fracture due to the pleural-based nodules proximity to the ribs.  I discussed with him that without treatemnt this could develop into a more aggressive or even metastatic cancer if these go untreated.  We scheduled her for simulation next week.    I spent 40 minutes face to face with the patient and more than 50% of that time was spent in counseling and/or coordination of care. Marland Kitchen   ------------------------------------------------  Thea Silversmith, MD

## 2014-04-03 NOTE — Addendum Note (Signed)
Encounter addended by: Norm Salt, RN on: 04/03/2014  4:56 PM<BR>     Documentation filed: Notes Section

## 2014-04-09 ENCOUNTER — Other Ambulatory Visit: Payer: Self-pay | Admitting: Nurse Practitioner

## 2014-04-10 ENCOUNTER — Ambulatory Visit
Admission: RE | Admit: 2014-04-10 | Discharge: 2014-04-10 | Disposition: A | Discharge status: Home / Self Care | Payer: Medicare Other | Source: Ambulatory Visit | Attending: Radiation Oncology | Admitting: Radiation Oncology

## 2014-04-10 DIAGNOSIS — Z720 Tobacco use: Secondary | ICD-10-CM | POA: Diagnosis not present

## 2014-04-10 DIAGNOSIS — C3432 Malignant neoplasm of lower lobe, left bronchus or lung: Secondary | ICD-10-CM | POA: Diagnosis not present

## 2014-04-10 NOTE — Progress Notes (Signed)
[facility] Simulation and Treatment Planning Note   Name: [name] MRN: [id number]  Date: 04/10/2014  DOB: 07/22/1932   DIAGNOSIS: The encounter diagnosis was Primary malignant neoplasm of bronchus of left lower lobe.  CONSENT VERIFIED: yes  SET UP AND IMMOBILIZATION: Patient is setup supine in a vac loc with a custom moldable pillow for head and neck immobilization   NARRATIVE: The patient was brought to the [facility].  Identity was confirmed.  All relevant records and images related to the planned course of therapy were reviewed.  Then, the patient was positioned in a stable reproducible clinical set-up for radiation therapy.  CT images were obtained.  Skin markings were placed.  A four dimensional simulation was then performed to track tumor movement throughout the patients' breathing cycle. The CT images were loaded into the planning software where the target and avoidance structures were contoured.  The GTV was outlined on the free breathing, 4D and MIP image sets.  The radiation prescription was entered and confirmed.   TREATMENT PLANNING NOTE:  Treatment planning then occurred. I have requested 3D simulation with [facility] of the spinal cord, total lungs and gross tumor volume. I have also requested mlcs and an isodose plan.   Special treatment procedure will be performed as [name] will be receiving high dose per fraction.    A total of 1 complex treatment devices will be used

## 2014-04-20 DIAGNOSIS — Z720 Tobacco use: Secondary | ICD-10-CM | POA: Diagnosis not present

## 2014-04-20 DIAGNOSIS — C3432 Malignant neoplasm of lower lobe, left bronchus or lung: Secondary | ICD-10-CM | POA: Diagnosis not present

## 2014-04-21 DIAGNOSIS — C3432 Malignant neoplasm of lower lobe, left bronchus or lung: Secondary | ICD-10-CM | POA: Diagnosis not present

## 2014-04-21 DIAGNOSIS — Z720 Tobacco use: Secondary | ICD-10-CM | POA: Diagnosis not present

## 2014-04-23 ENCOUNTER — Ambulatory Visit
Admission: RE | Admit: 2014-04-23 | Discharge: 2014-04-23 | Disposition: A | Discharge status: Home / Self Care | Payer: Medicare Other | Source: Ambulatory Visit | Attending: Radiation Oncology | Admitting: Radiation Oncology

## 2014-04-23 DIAGNOSIS — C3432 Malignant neoplasm of lower lobe, left bronchus or lung: Secondary | ICD-10-CM | POA: Diagnosis not present

## 2014-04-23 DIAGNOSIS — R911 Solitary pulmonary nodule: Secondary | ICD-10-CM

## 2014-04-23 DIAGNOSIS — Z720 Tobacco use: Secondary | ICD-10-CM | POA: Diagnosis not present

## 2014-04-23 NOTE — Progress Notes (Signed)
  Radiation Oncology         [phone]) [phone] ________________________________  Name: [name] MRN: [id number]  Date: 04/23/2014  DOB: 12-08-1932  Stereotactic Body Radiotherapy Treatment Procedure Note  NARRATIVE:  [name] was brought to the stereotactic radiation treatment machine and placed supine on the CT couch. The patient was set up for stereotactic body radiotherapy on the body fix pillow.  3D TREATMENT PLANNING AND DOSIMETRY:  The patient's radiation plan was reviewed and approved prior to starting treatment.  It showed 3-dimensional radiation distributions overlaid onto the planning CT.  The [facility] for the target structures as well as the organs at risk were reviewed. The documentation of this is filed in the radiation oncology EMR.  SIMULATION VERIFICATION:  The patient underwent CT imaging on the treatment unit.  These were carefully aligned to document that the ablative radiation dose would cover the target volume and maximally spare the nearby organs at risk according to the planned distribution.  SPECIAL TREATMENT PROCEDURE: [name] received high dose ablative stereotactic body radiotherapy to the planned target volume without unforeseen complications. Treatment was delivered uneventfully. The high doses associated with stereotactic body radiotherapy and the significant potential risks require careful treatment set up and patient monitoring constituting a special treatment procedure   STEREOTACTIC TREATMENT MANAGEMENT:  Following delivery, the patient was evaluated clinically. The patient tolerated treatment without significant acute effects, and was discharged to home in stable condition.    PLAN: Continue treatment as planned.  _________________________   Thea Silversmith, MD

## 2014-04-25 ENCOUNTER — Ambulatory Visit
Admission: RE | Admit: 2014-04-25 | Discharge: 2014-04-25 | Disposition: A | Discharge status: Home / Self Care | Payer: Medicare Other | Source: Ambulatory Visit | Attending: Radiation Oncology | Admitting: Radiation Oncology

## 2014-04-25 ENCOUNTER — Encounter: Payer: Self-pay | Admitting: Radiation Oncology

## 2014-04-25 DIAGNOSIS — Z720 Tobacco use: Secondary | ICD-10-CM | POA: Diagnosis not present

## 2014-04-25 DIAGNOSIS — C3432 Malignant neoplasm of lower lobe, left bronchus or lung: Secondary | ICD-10-CM | POA: Diagnosis not present

## 2014-04-25 NOTE — Progress Notes (Signed)
  Radiation Oncology         [phone] ________________________________  Name: [name] MRN: [id number]  Date: 04/25/2014  DOB: [birth year]  Stereotactic Body Radiotherapy Treatment Procedure Note  Current dose: 36 gray        planned dose: 54 gray  NARRATIVE:  CHELCY BOLDA was brought to the stereotactic radiation treatment machine and placed supine on the CT couch. The patient was set up for stereotactic body radiotherapy on the body fix pillow.  3D TREATMENT PLANNING AND DOSIMETRY:  The patient's radiation plan was reviewed and approved prior to starting treatment.  It showed 3-dimensional radiation distributions overlaid onto the planning CT.  The [facility] for the target structures as well as the organs at risk were reviewed. The documentation of this is filed in the radiation oncology EMR.  SIMULATION VERIFICATION:  The patient underwent CT imaging on the treatment unit.  These were carefully aligned to document that the ablative radiation dose would cover the target volume and maximally spare the nearby organs at risk according to the planned distribution.  SPECIAL TREATMENT PROCEDURE: [name] received high dose ablative stereotactic body radiotherapy to the planned target volume without unforeseen complications. Treatment was delivered uneventfully. The high doses associated with stereotactic body radiotherapy and the significant potential risks require careful treatment set up and patient monitoring constituting a special treatment procedure   STEREOTACTIC TREATMENT MANAGEMENT:  Following delivery, the patient was evaluated clinically. The patient tolerated treatment without significant acute effects, and was discharged to home in stable condition.    PLAN: Continue treatment as planned.  ________________________________  Blair Promise, PhD, MD

## 2014-04-30 ENCOUNTER — Encounter: Payer: Self-pay | Admitting: Radiation Oncology

## 2014-04-30 ENCOUNTER — Ambulatory Visit
Admission: RE | Admit: 2014-04-30 | Discharge: 2014-04-30 | Disposition: A | Discharge status: Home / Self Care | Payer: Medicare Other | Source: Ambulatory Visit | Attending: Radiation Oncology | Admitting: Radiation Oncology

## 2014-04-30 VITALS — BP 114/65 | HR 75 | Temp 97.7°F | Resp 16 | Ht 65.0 in | Wt 111.8 lb

## 2014-04-30 DIAGNOSIS — C3432 Malignant neoplasm of lower lobe, left bronchus or lung: Secondary | ICD-10-CM | POA: Diagnosis not present

## 2014-04-30 DIAGNOSIS — Z720 Tobacco use: Secondary | ICD-10-CM | POA: Diagnosis not present

## 2014-04-30 NOTE — Progress Notes (Signed)
  Radiation Oncology         [phone] ________________________________  Name: [name] MRN: [id number]  Date: 04/30/2014  DOB: Mar 15, 1932  End of Treatment Note  Diagnosis:   Likely stage 1 non small cell cancer (no biopsy)      Indication for treatment:  curative       Radiation treatment dates:   04/23/14- 04/29/24  Site/dose:   Left lower lobe/54 Gy in 3 fractions at 18 Gy/ fraction  Beams/energy:    dynamic conformal arcs with 6 MV photons in filter free mode with daily cone beam CT.  Narrative: The patient tolerated radiation treatment relatively well. [name] shows no ill effects. She had vertigo throughout her treatment.  Plan: The patient has completed radiation treatment. The patient will return to radiation oncology clinic for routine followup in one month, will order a CT at that time. Follow up with Dr. Elsworth Soho. Alternate with CT every 3 to 4 months.  This document serves as a record of services personally performed by Thea Silversmith , MD. It was created on her behalf by Pearlie Oyster, a trained medical scribe. The creation of this record is based on the scribe's personal observations and the provider's statements to them. This document has been checked and approved by the attending provider.      ------------------------------------------------  Thea Silversmith, MD

## 2014-04-30 NOTE — Progress Notes (Signed)
[name] has completed SBRT to her left lower lung.  She is here in a wheelchair.  She denies pain.  She reports feeling weak today.  She continues to have dizzy spells.  She reports shortness of breath with activity.  Her oxygen sat on room air today was 99% today.  She denies a cough. She has been given a one month follow up appointment card.    BP 114/65 mmHg  Pulse 75  Temp(Src) 97.7 F (36.5 C) (Oral)  Resp 16  Ht '5\' 5"'$  (1.651 m)  Wt 111 lb 12.8 oz (50.712 kg)  BMI 18.60 kg/m2  SpO2 99%

## 2014-04-30 NOTE — Progress Notes (Signed)
  Radiation Oncology         [phone] ________________________________  Name: [name] MRN: [id number]  Date: 04/30/2014  DOB: 1932-11-26  Stereotactic Body Radiotherapy Treatment Procedure Note  NARRATIVE:  [name] was brought to the stereotactic radiation treatment machine and placed supine on the CT couch. The patient was set up for stereotactic body radiotherapy on the body fix pillow.  3D TREATMENT PLANNING AND DOSIMETRY:  The patient's radiation plan was reviewed and approved prior to starting treatment.  It showed 3-dimensional radiation distributions overlaid onto the planning CT.  The [facility] for the target structures as well as the organs at risk were reviewed. The documentation of this is filed in the radiation oncology EMR.  SIMULATION VERIFICATION:  The patient underwent CT imaging on the treatment unit.  These were carefully aligned to document that the ablative radiation dose would cover the target volume and maximally spare the nearby organs at risk according to the planned distribution.  SPECIAL TREATMENT PROCEDURE: [name] received high dose ablative stereotactic body radiotherapy to the planned target volume without unforeseen complications. Treatment was delivered uneventfully. The high doses associated with stereotactic body radiotherapy and the significant potential risks require careful treatment set up and patient monitoring constituting a special treatment procedure   STEREOTACTIC TREATMENT MANAGEMENT:  Following delivery, the patient was evaluated clinically. The patient tolerated treatment without significant acute effects, and was discharged to home in stable condition.    PLAN: Continue treatment as planned.  _________________________   Thea Silversmith, MD

## 2014-05-11 NOTE — Progress Notes (Signed)
  Radiation Oncology         [phone]) [phone] ________________________________  Name: [name] MRN: [id number]  Date: 04/30/2014  DOB: 1932/01/31  End of Treatment Note  Diagnosis:  Stage I NSCLC of the LLL (no biopsy)  Indication for treatment:  Curative  Radiation treatment dates:   04/23/14, 04/25/14, 04/30/14  Site/dose:  Left lower lobe / 54 Gy in 3 fractions at 18 Gy per fraction  Beams/energy:  3D DCA with 6 MV photons in filter free mode  Narrative: The patient tolerated radiation treatment relatively well.  She tolerated her treatments well with no ill effects.   Plan: The patient has completed radiation treatment. The patient will return to radiation oncology clinic for routine followup in one month. I advised them to call or return sooner if they have any questions or concerns related to their recovery or treatment.  ------------------------------------------------  Thea Silversmith, MD

## 2014-05-28 NOTE — Progress Notes (Signed)
   Department of Radiation Oncology  Phone:  [phone] Fax:        [phone]   Name: [name] MRN: [id number]  DOB: 06-06-1932  Date: 05/29/2014  Follow Up Visit Note  Diagnosis: Stage I NSCLC of the Left Lower Lobe (no biopsy)  Summary and Interval since last radiation: 04/30/14 Site/dose:  Left lower lobe / 54 Gy in 3 fractions at 18 Gy per fraction  Interval History: [name] presents today for routine followup. The pt denies pain, but states intermittent shortness of breath which is stable. She has no hemoptysis and has not noticed any skin chances. No CT scan scheduled at this time. She is accompanied by her daughter.   Physical Exam:  There were no vitals filed for this visit. Pleasant and alert. No skin changes.  IMPRESSION: [name] is a 81 y.o. female presumed non-small cell lung cancer with no acute affects of treatment.  PLAN:  Schedule a CT scan in 2 weeks. She should follow up with Dr. Driscilla Moats after this. Let her know that this lesion might get bigger on the scan scheduled 2 weeks from now. This is a normal inflammaory response to SBRT. The pt will then follow up with me in 6 months with a CT scan.  She can call me if she has any questions.  This document serves as a record of services personally performed by Thea Silversmith, MD. It was created on her behalf by Darcus Austin, a trained medical scribe. The creation of this record is based on the scribe's personal observations and the provider's statements to them. This document has been checked and approved by the attending provider.   Thea Silversmith, MD

## 2014-05-29 ENCOUNTER — Ambulatory Visit
Admission: RE | Admit: 2014-05-29 | Discharge: 2014-05-29 | Disposition: A | Discharge status: Home / Self Care | Payer: Medicare Other | Source: Ambulatory Visit | Attending: Radiation Oncology | Admitting: Radiation Oncology

## 2014-05-29 DIAGNOSIS — R911 Solitary pulmonary nodule: Secondary | ICD-10-CM

## 2014-05-29 NOTE — Progress Notes (Signed)
Routine one month follow up completion of SBRT to LLL for non-small cell lung cancer.Denies pain.Intermittent shortness of breath.No skin changes.

## 2014-05-30 ENCOUNTER — Telehealth: Payer: Self-pay | Admitting: *Deleted

## 2014-05-30 NOTE — Telephone Encounter (Signed)
CALLED PATIENT TO INFORM OF SCANS AND FU, SPOKE WITH PATIENT AND SHE IS AWARE OF THESE APPTS., MAILED PATIENT APPT. CALENDAR

## 2014-06-10 ENCOUNTER — Other Ambulatory Visit: Payer: Self-pay | Admitting: Nurse Practitioner

## 2014-06-10 NOTE — Telephone Encounter (Signed)
Last filled 05/07/14, last seen 07/31/13. Pt uses [employer]

## 2014-06-10 NOTE — Telephone Encounter (Signed)
Please call in xanax with 1 refills 

## 2014-06-12 ENCOUNTER — Other Ambulatory Visit: Payer: Self-pay | Admitting: Nurse Practitioner

## 2014-06-12 ENCOUNTER — Encounter (HOSPITAL_COMMUNITY): Payer: Self-pay

## 2014-06-12 ENCOUNTER — Ambulatory Visit (HOSPITAL_COMMUNITY)
Admission: RE | Admit: 2014-06-12 | Discharge: 2014-06-12 | Disposition: A | Discharge status: Home / Self Care | Payer: Medicare Other | Source: Ambulatory Visit | Attending: Radiation Oncology | Admitting: Radiation Oncology

## 2014-06-12 DIAGNOSIS — Z85118 Personal history of other malignant neoplasm of bronchus and lung: Secondary | ICD-10-CM | POA: Diagnosis not present

## 2014-06-12 DIAGNOSIS — J449 Chronic obstructive pulmonary disease, unspecified: Secondary | ICD-10-CM | POA: Diagnosis not present

## 2014-06-12 DIAGNOSIS — Z923 Personal history of irradiation: Secondary | ICD-10-CM | POA: Diagnosis not present

## 2014-06-12 DIAGNOSIS — J479 Bronchiectasis, uncomplicated: Secondary | ICD-10-CM | POA: Diagnosis not present

## 2014-06-12 DIAGNOSIS — R911 Solitary pulmonary nodule: Secondary | ICD-10-CM

## 2014-06-16 ENCOUNTER — Ambulatory Visit (INDEPENDENT_AMBULATORY_CARE_PROVIDER_SITE_OTHER): Payer: Medicare Other | Admitting: Adult Health

## 2014-06-16 ENCOUNTER — Encounter: Payer: Self-pay | Admitting: Adult Health

## 2014-06-16 VITALS — BP 132/74 | HR 88 | Temp 98.4°F | Ht 65.0 in | Wt 113.6 lb

## 2014-06-16 DIAGNOSIS — R911 Solitary pulmonary nodule: Secondary | ICD-10-CM | POA: Diagnosis not present

## 2014-06-16 DIAGNOSIS — J432 Centrilobular emphysema: Secondary | ICD-10-CM | POA: Diagnosis not present

## 2014-06-16 NOTE — Assessment & Plan Note (Signed)
Compensated without flare, Patient is on no maintenance therapy May  use albuterol as needed

## 2014-06-16 NOTE — Progress Notes (Signed)
Subjective:    Patient ID: [name], female    DOB: 17-May-1932, 81 y.o.   MRN: [id number]  HPI  81 year old smoker for FU of mild COPD & pulmonary nodule  PMh/o COPD, remote PE, colonic adenoma and lymphocytic colitis,  2007 - H/o left empyema requiring thoracotomy, complicated by bleeding & PMV , tstomy      Accompanied by friend and daughter, [name] who is an occupational nurse  off o2  Smokes 5-10 cigs/d Breathing ok, off symbicort  Admitted 06/2013 w/ cc: weakness,falls , cough , hypoxia & BL infiltrates, wbc CT 23.6. Tx w/ IV abx , steroids and nebs. Discharged on O2 ,Symbicort  she lived with her daughter for 6 months and just moved back home -she had a fall , required ER visit and sutures for lacerated wound on her head .  06/16/2014 Follow up : COPD /Lung Cancer (lung nodule-T1NO)  Pt returns follow up  Has COPD on albuterol inhaler only.  Says she never uses her albuterol  Tried on Symbicort in 2015 with no perceived benefit.  No flare of cough or wheezing .    Patient was found to have a lung nodule on CT chest in June 2015. Follow-up CT chest showed the nodule is slightly larger. Subsequent PET scan was positive for a 1.3. Hypermetabolic mass in the left lower lobe.  She was not a surgical candidate. She was referred to radiation oncology. She was seen by Dr. Pablo Long.  She was started on radiation treatments 04/10/14.  Follow-up CT chest showed a decrease in the size of the left lower lobe lung nodule now measuring 10 x 6 mm. There was no evidence of metastatic disease. Tolerating treatments well except for fatigue.  We discussed her test results with patient and family. She denies chest pain, orthopnea, edema , fever, or hemoptysis. Appetite is improving and weight has went up a few pounds.  She continues to smoke. Discussed smoking cessation.   Significant tests/ events  CT chest 06/29/13: New extensive interstitial lung disease primarily in the upper  lobes with progressive bronchiectasis at the right base and new extensive bronchiectasis at the left base. And New hilar and mediastinal adenopathy- compared to CT from 2010.  Sputum AFB neg  Workup showed -ACE 48, RA mild pos, ESR 30, ANA positive 1:40  PFTs  08/2013 - FVC 96%, TLC 95%, ratio 74, no obstruction, DLCO 43% -10.3  CT 10/03/13 -aggressive appearing 1.5 x 1.2 x 1.5 cm nodule with spiculated margins the causes retraction of the adjacent left major fissure CT chest 01/23/14 - unchanged spiculated left lower lobe (sup seg) nodule  PET  01/2014-hypermetabolic nodule, no LNs light up CT chest  06/12/14 >Interval decrease in size of what is now a 10 x 6 mm nodule in the superior segment of the left lower lobe. No signs of metastaticdisease in the thorax.  Persistent peribronchovascular fibrotic changes and mild cylindrical bronchiectasis in the lower lobes of the lungsbilaterally (left greater than right),     Past Medical History  Diagnosis Date  . Anxiety disorder   . GERD (gastroesophageal reflux disease)   . COPD (chronic obstructive pulmonary disease)   . Depression   . Hyperlipidemia   . Peptic ulcer disease   . Pneumonia 2007    prolonged ICU stay on vent with trach  . Pulmonary embolism 2004    off anticoagulation  . Colon adenoma   . Pseudotumor cerebri   . Irritable bowel syndrome   .  Lymphocytic colitis 12/15/2011  . Fall at home May 17, 2012    Pt. fell in tub today  . Radiation 4/13,4/15,04/30/14    Left lower lung  . Cancer      Review of Systems neg for any significant sore throat, dysphagia, itching, sneezing, nasal congestion or excess/ purulent secretions, fever, chills, sweats, unintended wt loss, pleuritic or exertional cp, hempoptysis, orthopnea pnd or change in chronic leg swelling. Also denies presyncope, palpitations, heartburn, abdominal pain, nausea, vomiting, diarrhea or change in bowel or urinary habits, dysuria,hematuria, rash, arthralgias, visual  complaints, headache, numbness weakness or ataxia.     Objective:   Physical Exam  Gen. Pleasant, thin, in no distress ENT - no lesions, no post nasal drip Neck: No JVD, no thyromegaly, no carotid bruits Lungs: no use of accessory muscles, no dullness to percussion, decreased without rales or rhonchi  Cardiovascular: Rhythm regular, heart sounds  normal, no murmurs or gallops, no peripheral edema Musculoskeletal: No deformities, no cyanosis or clubbing         Assessment & Plan:

## 2014-06-16 NOTE — Assessment & Plan Note (Signed)
Left lower lobe lung nodule PET positive presumed lung cancer Patient was not a surgical candidate. She is tolerating radiation treatments. CT chest shows positive response with decreased lung nodule size Is to continue to follow-up for her treatment as planned and follow-up with Dr. Pablo Ledger.

## 2014-06-16 NOTE — Patient Instructions (Signed)
Continue on current regimen  Follow up Dr. Elsworth Soho  In 3 months and As needed

## 2014-06-17 ENCOUNTER — Telehealth: Payer: Self-pay

## 2014-06-17 ENCOUNTER — Other Ambulatory Visit: Payer: Self-pay | Admitting: Nurse Practitioner

## 2014-06-17 DIAGNOSIS — Z1231 Encounter for screening mammogram for malignant neoplasm of breast: Secondary | ICD-10-CM

## 2014-06-17 NOTE — Telephone Encounter (Signed)
Informed patient that ct of chest performed on 06/12/14 was good with interval decrease in nodule size.Patient excited about news and thankful for the call.

## 2014-06-18 NOTE — Progress Notes (Signed)
Reviewed & agree with plan  

## 2014-06-24 ENCOUNTER — Ambulatory Visit (HOSPITAL_COMMUNITY)
Admission: RE | Admit: 2014-06-24 | Discharge: 2014-06-24 | Disposition: A | Discharge status: Home / Self Care | Payer: Medicare Other | Source: Ambulatory Visit | Attending: Nurse Practitioner | Admitting: Nurse Practitioner

## 2014-06-24 ENCOUNTER — Other Ambulatory Visit: Payer: Self-pay | Admitting: Nurse Practitioner

## 2014-06-24 DIAGNOSIS — Z1231 Encounter for screening mammogram for malignant neoplasm of breast: Secondary | ICD-10-CM

## 2014-07-30 ENCOUNTER — Other Ambulatory Visit: Payer: Self-pay | Admitting: Nurse Practitioner

## 2014-09-03 ENCOUNTER — Other Ambulatory Visit: Payer: Self-pay | Admitting: Nurse Practitioner

## 2014-09-03 NOTE — Telephone Encounter (Signed)
Last seen 01/30/14 MMM If approved route to nurse to call into [facility] 

## 2014-09-04 ENCOUNTER — Encounter (HOSPITAL_COMMUNITY): Payer: Medicare Other

## 2014-09-04 ENCOUNTER — Ambulatory Visit: Payer: Medicare Other | Admitting: Vascular Surgery

## 2014-09-04 NOTE — Telephone Encounter (Signed)
Please call in xanax with 0 refills Patient NTBS for follow up and lab work  

## 2014-09-10 ENCOUNTER — Encounter: Payer: Self-pay | Admitting: Vascular Surgery

## 2014-09-11 ENCOUNTER — Encounter: Payer: Self-pay | Admitting: Vascular Surgery

## 2014-09-11 ENCOUNTER — Ambulatory Visit (INDEPENDENT_AMBULATORY_CARE_PROVIDER_SITE_OTHER): Payer: Medicare Other | Admitting: Vascular Surgery

## 2014-09-11 ENCOUNTER — Ambulatory Visit (HOSPITAL_COMMUNITY)
Admission: RE | Admit: 2014-09-11 | Discharge: 2014-09-11 | Disposition: A | Discharge status: Home / Self Care | Payer: Medicare Other | Source: Ambulatory Visit | Attending: Vascular Surgery | Admitting: Vascular Surgery

## 2014-09-11 VITALS — BP 152/64 | HR 79 | Temp 98.2°F | Ht 65.0 in | Wt 112.8 lb

## 2014-09-11 DIAGNOSIS — I739 Peripheral vascular disease, unspecified: Secondary | ICD-10-CM

## 2014-09-11 DIAGNOSIS — E785 Hyperlipidemia, unspecified: Secondary | ICD-10-CM | POA: Diagnosis not present

## 2014-09-11 DIAGNOSIS — M7989 Other specified soft tissue disorders: Secondary | ICD-10-CM | POA: Diagnosis not present

## 2014-09-11 NOTE — Progress Notes (Signed)
[facility] Progress Note  Subjective: Patient is here for f/u from bilateral external iliac stenting (08/2012). Her shortness of breath usually interferes with exercise first. She is able to walk half a block before she is short of breath. She denies any rest pain at night. She currently is able to ambulate as much as she wishes. She has fairly significant pulmonary dysfunction and required hospitalization recently for this. she was recently diagnosed with lung cancer and was found to be not a candidate for operation due to severe underlying COPD. She has recently undergone radiation therapy and is followed by Dr. Elsworth Soho.     Physical Exam:    Filed Vitals:   09/11/14 1314  BP: 152/64  Pulse: 79  Temp: 98.2 F (36.8 C)  TempSrc: Oral  Height: '5\' 5"'$  (1.651 m)  Weight: 112 lb 12.8 oz (51.166 kg)  SpO2: 98%    Pulses: palpable femoral pulses bilaterally. Palpable left popliteal pulse and palpable left DP pulse. Right popliteal, DP and PT pulses non-palpable. Extremities: Feet slightly cool to touch. No ulcerations. Seconds left first toenail   ABIs: Right- 0.66, Left -0.98.  I reviewed and interpreted this study.. She has a known chronic right superficial femoral artery occlusion.    Assessment:  81 y.o. female is s/p bilateral external iliac stenting 08/2012 currently asymptomatic  Plan: -Follow-up in 12 months for ABIs  Ruta Hinds, MD [facility] [place]: [phone] Pager: [phone]

## 2014-09-17 NOTE — Addendum Note (Signed)
Addended by: Dorthula Rue L on: 09/17/2014 09:43 AM   Modules accepted: Orders

## 2014-10-10 ENCOUNTER — Other Ambulatory Visit: Payer: Self-pay | Admitting: Nurse Practitioner

## 2014-10-13 NOTE — Telephone Encounter (Signed)
Please call in xanax with 0 refills no more refills without being seen  

## 2014-10-13 NOTE — Telephone Encounter (Signed)
Last seen 01/30/14 MMM If approved route to nurse to call into [facility] 

## 2014-10-13 NOTE — Telephone Encounter (Signed)
Refill called [name]

## 2014-10-27 DIAGNOSIS — Z23 Encounter for immunization: Secondary | ICD-10-CM | POA: Diagnosis not present

## 2014-11-10 ENCOUNTER — Other Ambulatory Visit: Payer: Self-pay | Admitting: Nurse Practitioner

## 2014-11-11 NOTE — Telephone Encounter (Signed)
rx called into pharmacy

## 2014-11-11 NOTE — Telephone Encounter (Signed)
Please advise on refill- called and scheduled patient for a follow up Friday 11/14/14, as she had not been seen since 01/2014.

## 2014-11-11 NOTE — Telephone Encounter (Signed)
Please call in xanax with 0 refills Last refill without being seen

## 2014-11-14 ENCOUNTER — Encounter: Payer: Self-pay | Admitting: Nurse Practitioner

## 2014-11-14 ENCOUNTER — Ambulatory Visit (INDEPENDENT_AMBULATORY_CARE_PROVIDER_SITE_OTHER): Payer: Medicare Other | Admitting: Nurse Practitioner

## 2014-11-14 VITALS — BP 148/73 | HR 78 | Temp 97.1°F | Ht 65.0 in | Wt 114.0 lb

## 2014-11-14 DIAGNOSIS — E785 Hyperlipidemia, unspecified: Secondary | ICD-10-CM

## 2014-11-14 DIAGNOSIS — R601 Generalized edema: Secondary | ICD-10-CM | POA: Diagnosis not present

## 2014-11-14 DIAGNOSIS — K219 Gastro-esophageal reflux disease without esophagitis: Secondary | ICD-10-CM | POA: Diagnosis not present

## 2014-11-14 DIAGNOSIS — E43 Unspecified severe protein-calorie malnutrition: Secondary | ICD-10-CM | POA: Diagnosis not present

## 2014-11-14 DIAGNOSIS — N393 Stress incontinence (female) (male): Secondary | ICD-10-CM | POA: Diagnosis not present

## 2014-11-14 DIAGNOSIS — I739 Peripheral vascular disease, unspecified: Secondary | ICD-10-CM | POA: Diagnosis not present

## 2014-11-14 DIAGNOSIS — C3432 Malignant neoplasm of lower lobe, left bronchus or lung: Secondary | ICD-10-CM

## 2014-11-14 DIAGNOSIS — C3490 Malignant neoplasm of unspecified part of unspecified bronchus or lung: Secondary | ICD-10-CM | POA: Insufficient documentation

## 2014-11-14 MED ORDER — TRIAMTERENE-HCTZ 37.5-25 MG PO CAPS: ORAL_CAPSULE | ORAL | Status: DC

## 2014-11-14 MED ORDER — OXYBUTYNIN CHLORIDE 5 MG PO TABS: ORAL_TABLET | ORAL | Status: DC

## 2014-11-14 MED ORDER — PANTOPRAZOLE SODIUM 40 MG PO TBEC: 40.0000 mg | DELAYED_RELEASE_TABLET | Freq: Every day | ORAL | Status: DC

## 2014-11-14 NOTE — Progress Notes (Signed)
Subjective:    Patient ID: [name], female    DOB: [birth year], 81 y.o.   MRN: [id number]  HPI Patient in today for follow up- She has not been seen in quite sometime. Since her last visit she has been diagnosed with lung cancer and has completed her radiation. SHe says she is doing okay- has follow up appointment the first of December. Her other chronic medical problems sem to be under control.  Patient Active Problem List   Diagnosis Date Noted  . Centrilobular emphysema ([place]) 10/08/2013  . Solitary pulmonary nodule 10/08/2013  . Protein-calorie malnutrition, severe ([place]) 06/21/2013  . Peripheral vascular disease, unspecified ([place]) 05/17/2012  . Pain in limb 05/17/2012  . Edema 02/16/2012  . IBS (irritable bowel syndrome) 02/09/2012  . Lymphocytic colitis 12/15/2011  . Personal history of adenomatous colonic polyps 12/02/2011  . GERD 10/20/2008   Outpatient Encounter Prescriptions as of 11/14/2014  Medication Sig  . albuterol (PROVENTIL HFA;VENTOLIN HFA) 108 (90 BASE) MCG/ACT inhaler Inhale 2 puffs into the lungs every 4 (four) hours as needed for wheezing or shortness of breath.  . ALPRAZolam (XANAX) 0.5 MG tablet TAKE  (1)  TABLET TWICE A DAY.  Marland Kitchen aspirin EC 325 MG tablet Take 325 mg by mouth daily.  . Cholecalciferol (VITAMIN D) 2000 UNITS tablet Take 2,000 Units by mouth daily.  . colestipol (COLESTID) 1 G tablet TAKE (2) TABLETS TWICE DAILY.  Marland Kitchen CRANBERRY PO Take 1 tablet by mouth daily.  . Guaifenesin (MUCINEX MAXIMUM STRENGTH) 1200 MG TB12 Take 1,200 mg by mouth every morning.  Marland Kitchen oxybutynin (DITROPAN) 5 MG tablet TAKE (1) TABLET TWICE A DAY.  . pantoprazole (PROTONIX) 40 MG tablet TAKE 1 TABLET DAILY  . rosuvastatin (CRESTOR) 10 MG tablet Take 10 mg by mouth daily.  Marland Kitchen triamterene-hydrochlorothiazide (DYAZIDE) 37.5-25 MG per capsule TAKE (1) CAPSULE DAILY  . vitamin E 400 UNIT capsule Take 400 Units by mouth daily.  . [DISCONTINUED] atorvastatin (LIPITOR) 40 MG tablet  Take 1 tablet (40 mg total) by mouth daily.   No facility-administered encounter medications on file as of 11/14/2014.      Review of Systems  Constitutional: Negative.   HENT: Negative.   Respiratory: Positive for cough and shortness of breath.   Cardiovascular: Negative.  Negative for chest pain.  Genitourinary: Negative.   Neurological: Negative.   Psychiatric/Behavioral: Negative.   All other systems reviewed and are negative.      Objective:   Physical Exam  Constitutional: She is oriented to person, place, and time. She appears well-developed and well-nourished.  HENT:  Nose: Nose normal.  Mouth/Throat: Oropharynx is clear and moist.  Eyes: EOM are normal.  Neck: Trachea normal, normal range of motion and full passive range of motion without pain. Neck supple. No JVD present. Carotid bruit is not present. No thyromegaly present.  Cardiovascular: Normal rate, regular rhythm, normal heart sounds and intact distal pulses.  Exam reveals no gallop and no friction rub.   No murmur heard. Pulmonary/Chest: Effort normal and breath sounds normal.  Abdominal: Soft. Bowel sounds are normal. She exhibits no distension and no mass. There is no tenderness.  Musculoskeletal: Normal range of motion.  Lymphadenopathy:    She has no cervical adenopathy.  Neurological: She is alert and oriented to person, place, and time. She has normal reflexes.  Skin: Skin is warm and dry.  Psychiatric: She has a normal mood and affect. Her behavior is normal. Judgment and thought content normal.    BP  148/73 mmHg  Pulse 78  Temp(Src) 97.1 F (36.2 C) (Oral)  Ht _0  (1.651 m)  Wt 114 lb (51.71 kg)  BMI 18.97 kg/m2       Assessment & Plan:  1. Peripheral vascular disease, unspecified ([place]) elevate legs when sitting - triamterene-hydrochlorothiazide (DYAZIDE) 37.5-25 MG capsule; TAKE (1) CAPSULE DAILY  Dispense: 30 capsule; Refill: 5  2. Gastroesophageal reflux disease, esophagitis presence  not specified Avoid spicy foods Do not eat 2 hours prior to bedtime - pantoprazole (PROTONIX) 40 MG tablet; Take 1 tablet (40 mg total) by mouth daily.  Dispense: 30 tablet; Refill: 5  3. Malignant neoplasm of lower lobe of left lung [facility]) Keep follow up with oncologist  4. Generalized edema  5. Protein-calorie malnutrition, severe ([facility]) High calorie and high protein diet  6. Hyperlipidemia with target LDL less than 100 Low fta diet - CMP14+EGFR - Lipid panel  7. Stress incontinence - oxybutynin (DITROPAN) 5 MG tablet; TAKE (1) TABLET TWICE A DAY.  Dispense: 60 tablet; Refill: 5    Labs pending Health maintenance reviewed Diet and exercise encouraged Continue all meds Follow up  In 6 month   [place], FNP

## 2014-11-14 NOTE — Patient Instructions (Signed)
High-Protein and High-Calorie Diet Eating high-protein and high-calorie foods can help you to gain weight, heal after an injury, and recover after an illness or surgery.  WHAT IS MY PLAN? The specific amount of daily protein and calories you need depends on:  Your body weight.  The reason this diet is recommended for you. Generally, a high-protein, high-calorie diet involves:   Eating 250-500 extra calories each day.  Making sure that 10-35% of your daily calories come from protein. Talk to your health care provider about how much protein and how many calories you need each day. Follow the diet as directed by your health care provider.  WHAT DO I NEED TO KNOW ABOUT THIS DIET?  Ask your health care provider if you should take a nutritional supplement.   Try to eat six small meals each day instead of three large meals.   Eat a balanced diet, including one food that is high in protein at each meal.  Keep nutritious snacks handy, such as nuts, trail mixes, dried fruit, and yogurt.   If you have kidney disease or diabetes, eating too much protein may put extra stress on your kidneys. Talk to your health care provider if you have either of those conditions. WHAT ARE SOME HIGH-PROTEIN FOODS? Grains Quinoa. Bulgur wheat. Vegetables Soybeans. Peas. Meats and Other Protein Sources Beef, pork, and poultry. Fish and seafood. Eggs. Tofu. Textured vegetable protein (TVP). Peanut butter. Nuts and seeds. Dried beans. Protein powders. Dairy Whole milk. Whole-milk yogurt. Powdered milk. Cheese. [employer]. Eggnog. Beverages High-protein supplement drinks. Soy milk. Other Protein bars. The items listed above may not be a complete list of recommended foods or beverages. Contact your dietitian for more options. WHAT ARE SOME HIGH-CALORIE FOODS? Grains Pasta. Quick breads. Muffins. Pancakes. Ready-to-eat cereal. Vegetables Vegetables cooked in oil or butter. Fried  potatoes. Fruits Dried fruit. Fruit leather. Canned fruit in syrup. Fruit juice. Avocados. Meats and Other Protein Sources Peanut butter. Nuts and seeds. Dairy Heavy cream. Whipped cream. Cream cheese. Sour cream. Ice cream. Custard. Pudding. Beverages Meal-replacement beverages. Nutrition shakes. Fruit juice. Sugar-sweetened soft drinks. Condiments Salad dressing. Mayonnaise. Alfredo sauce. Fruit preserves or jelly. Honey. Syrup. Sweets/Desserts Cake. Cookies. Pie. Pastries. Candy bars. Chocolate. Fats and Oils Butter or margarine. Oil. Gravy. Other Meal-replacement bars. The items listed above may not be a complete list of recommended foods or beverages. Contact your dietitian for more options. WHAT ARE SOME TIPS FOR INCLUDING HIGH-PROTEIN AND HIGH-CALORIE FOODS IN MY DIET?  Add whole milk, half-and-half, or heavy cream to cereal, pudding, soup, or hot cocoa.  Add whole milk to instant breakfast drinks.  Add peanut butter to oatmeal or smoothies.  Add powdered milk to baked goods, smoothies, or milkshakes.  Add powdered milk, cream, or butter to mashed potatoes.  Add cheese to cooked vegetables.  Make whole-milk yogurt parfaits. Top them with granola, fruit, or nuts.  Add cottage cheese to your fruit.  Add avocados, cheese, or both to sandwiches or salads.  Add meat, poultry, or seafood to rice, pasta, casseroles, salads, and soups.   Use mayonnaise when making egg salad, chicken salad, or tuna salad.  Use peanut butter as a topping for pretzels, celery, or crackers.  Add beans to casseroles, dips, and spreads.  Add pureed beans to sauces and soups.  Replace calorie-free drinks with calorie-containing drinks, such as milk and fruit juice.   This information is not intended to replace advice given to you by your health care provider. Make sure you discuss  any questions you have with your health care provider.   Document Released: 12/27/2004 Document  Revised: 01/17/2014 Document Reviewed: 06/11/2013 Elsevier Interactive Patient Education [employer].

## 2014-11-15 LAB — CMP14+EGFR
ALBUMIN: 3.7 g/dL (ref 3.5–4.7)
ALK PHOS: 78 IU/L (ref 39–117)
ALT: 15 IU/L (ref 0–32)
AST: 22 IU/L (ref 0–40)
Albumin/Globulin Ratio: 1.2 (ref 1.1–2.5)
BUN / CREAT RATIO: 31 — AB (ref 11–26)
BUN: 19 mg/dL (ref 8–27)
CHLORIDE: 98 mmol/L (ref 97–106)
CO2: 27 mmol/L (ref 18–29)
Calcium: 9.2 mg/dL (ref 8.7–10.3)
Creatinine, Ser: 0.61 mg/dL (ref 0.57–1.00)
GFR calc Af Amer: 99 mL/min/{1.73_m2} (ref >59)
GFR calc non Af Amer: 86 mL/min/{1.73_m2} (ref >59)
GLUCOSE: 84 mg/dL (ref 65–99)
Globulin, Total: 3.1 g/dL (ref 1.5–4.5)
Potassium: 5.1 mmol/L (ref 3.5–5.2)
Sodium: 139 mmol/L (ref 136–144)
Total Protein: 6.8 g/dL (ref 6.0–8.5)

## 2014-11-15 LAB — LIPID PANEL
Chol/HDL Ratio: 3.9 ratio units (ref 0.0–4.4)
Cholesterol, Total: 197 mg/dL (ref 100–199)
HDL: 50 mg/dL (ref >39)
LDL Calculated: 132 mg/dL — ABNORMAL HIGH (ref 0–99)
Triglycerides: 77 mg/dL (ref 0–149)
VLDL Cholesterol Cal: 15 mg/dL (ref 5–40)

## 2014-11-18 DIAGNOSIS — Z23 Encounter for immunization: Secondary | ICD-10-CM | POA: Diagnosis not present

## 2014-11-20 ENCOUNTER — Ambulatory Visit: Payer: Self-pay | Admitting: Radiation Oncology

## 2014-11-28 ENCOUNTER — Telehealth: Payer: Self-pay | Admitting: Nurse Practitioner

## 2014-11-28 ENCOUNTER — Ambulatory Visit (HOSPITAL_COMMUNITY)
Admission: RE | Admit: 2014-11-28 | Discharge: 2014-11-28 | Disposition: A | Discharge status: Home / Self Care | Payer: Medicare Other | Source: Ambulatory Visit | Attending: Radiation Oncology | Admitting: Radiation Oncology

## 2014-11-28 DIAGNOSIS — R918 Other nonspecific abnormal finding of lung field: Secondary | ICD-10-CM | POA: Insufficient documentation

## 2014-11-28 DIAGNOSIS — K7689 Other specified diseases of liver: Secondary | ICD-10-CM | POA: Insufficient documentation

## 2014-11-28 DIAGNOSIS — C3490 Malignant neoplasm of unspecified part of unspecified bronchus or lung: Secondary | ICD-10-CM | POA: Diagnosis not present

## 2014-11-28 DIAGNOSIS — I700 Atherosclerosis of aorta: Secondary | ICD-10-CM | POA: Insufficient documentation

## 2014-11-28 DIAGNOSIS — Z923 Personal history of irradiation: Secondary | ICD-10-CM | POA: Insufficient documentation

## 2014-11-28 DIAGNOSIS — R911 Solitary pulmonary nodule: Secondary | ICD-10-CM

## 2014-11-28 DIAGNOSIS — I2510 Atherosclerotic heart disease of native coronary artery without angina pectoris: Secondary | ICD-10-CM | POA: Insufficient documentation

## 2014-12-05 NOTE — Telephone Encounter (Signed)
Patient aware of results.

## 2014-12-08 ENCOUNTER — Other Ambulatory Visit: Payer: Self-pay | Admitting: Nurse Practitioner

## 2014-12-09 NOTE — Telephone Encounter (Signed)
Last seen 11/14/14  MMM  If approved route to nurse to call into [facility]

## 2014-12-09 NOTE — Telephone Encounter (Signed)
Please call in xanax with 1 refills 

## 2014-12-09 NOTE — Telephone Encounter (Signed)
rx called into pharmacy

## 2014-12-10 NOTE — Progress Notes (Signed)
   Department of Radiation Oncology  Phone:  [phone] Fax:        [phone]   Name: [name] MRN: [id number]  DOB: 1932-09-20  Date: 12/11/2014  Follow Up Visit Note  Diagnosis: Stage I NSCLC of the Left Lower Lobe (no biopsy)  Summary and Interval since last radiation: 04/30/14 Site/dose:  Left lower lobe / 54 Gy in 3 fractions at 18 Gy per fraction  Interval History: [name] presents today for routine followup. She had a CT scan on 11/18 which shows a decrease in size of th left lower lobe mass with no evidence of lymphadenopathy or progressive disease. She had a screening mammogram in June which was negative. Her breathing is stable. She is still smokin  1/2 pack or less a day. She has had her flu shot and pneumonia shot. She is accompanied by her family members today.   Physical Exam:  Filed Vitals:   12/11/14 1306  BP: 138/47  Pulse: 77  Temp: 97.9 F (36.6 C)  TempSrc: Oral  Resp: 20  Height: '5\' 5"'$  (1.651 m)  Weight: 113 lb 4.8 oz (51.393 kg)  SpO2: 100%   elderly thin female. No respiratory distress.   IMPRESSION: [name] is a 81 y.o. female presumed non-small cell lung cancer with no evidence of disease.   PLAN:  Follow up in 6 months with CT chest. Then to survivorship.   Thea Silversmith, MD  This document serves as a record of services personally performed by Thea Silversmith, MD. It was created on her behalf by Arlyce Harman, a trained medical scribe. The creation of this record is based on the scribe's personal observations and the provider's statements to them. This document has been checked and approved by the attending provider.

## 2014-12-11 ENCOUNTER — Encounter: Payer: Self-pay | Admitting: Radiation Oncology

## 2014-12-11 ENCOUNTER — Ambulatory Visit
Admission: RE | Admit: 2014-12-11 | Discharge: 2014-12-11 | Disposition: A | Discharge status: Home / Self Care | Payer: Medicare Other | Source: Ambulatory Visit | Attending: Radiation Oncology | Admitting: Radiation Oncology

## 2014-12-11 VITALS — BP 138/47 | HR 77 | Temp 97.9°F | Resp 20 | Ht 65.0 in | Wt 113.3 lb

## 2014-12-11 DIAGNOSIS — C3432 Malignant neoplasm of lower lobe, left bronchus or lung: Secondary | ICD-10-CM

## 2014-12-11 NOTE — Progress Notes (Signed)
Follow up  SBRT LLL 54 gy/3 fxs completed 04/30/14,  No pain, nausea, no coughing, no swallowing difficulties, does get sob with mild exertion, fell aty home 1 month ago hurt right side, denies pain at present, does get fatigued easily,  CT  Chest results from 11/28/14, still smoking < 1/2ppd cigarettes 1:12 PM BP 138/47 mmHg  Pulse 77  Temp(Src) 97.9 F (36.6 C) (Oral)  Resp 20  Ht '5\' 5"'$  (1.651 m)  Wt 113 lb 4.8 oz (51.393 kg)  BMI 18.85 kg/m2  SpO2 100%  Wt Readings from Last 3 Encounters:  12/11/14 113 lb 4.8 oz (51.393 kg)  11/14/14 114 lb (51.71 kg)  09/11/14 112 lb 12.8 oz (51.166 kg)   Gaspar Garbe, RN II Rad/Onc

## 2014-12-12 ENCOUNTER — Ambulatory Visit: Payer: Medicare Other | Admitting: Radiation Oncology

## 2015-01-19 ENCOUNTER — Ambulatory Visit (INDEPENDENT_AMBULATORY_CARE_PROVIDER_SITE_OTHER): Payer: Medicare Other | Admitting: Nurse Practitioner

## 2015-01-19 ENCOUNTER — Encounter: Payer: Self-pay | Admitting: Nurse Practitioner

## 2015-01-19 VITALS — BP 116/51 | HR 73 | Temp 97.2°F | Ht 65.0 in | Wt 117.0 lb

## 2015-01-19 DIAGNOSIS — K047 Periapical abscess without sinus: Secondary | ICD-10-CM

## 2015-01-19 MED ORDER — CLINDAMYCIN HCL 300 MG PO CAPS: 300.0000 mg | ORAL_CAPSULE | Freq: Four times a day (QID) | ORAL | Status: DC

## 2015-01-19 NOTE — Patient Instructions (Signed)

## 2015-01-19 NOTE — Progress Notes (Signed)
   Subjective:    Patient ID: [name], female    DOB: 1932/10/06, 82 y.o.   MRN: [id number]  HPI Patient in wanting her bottom gum checked- she has has some fairly decayed teeth for awhile now- over the weekend she says that  three bottom teeth  Broke off at  gum area is red and swollen. Very swoolen and painful to chew with. Foul smelling breath.   Review of Systems  Constitutional: Negative.   HENT: Negative.   Respiratory: Negative.   Cardiovascular: Negative.   Genitourinary: Negative.   Neurological: Negative.   Psychiatric/Behavioral: Negative.   All other systems reviewed and are negative.      Objective:   Physical Exam  Constitutional: She is oriented to person, place, and time. She appears well-developed and well-nourished.  HENT:  Multiple dental caries with multiple teeth broke off at gum line. Gums erythematoua Halitosis  Cardiovascular: Normal rate, regular rhythm and normal heart sounds.   Pulmonary/Chest: Effort normal and breath sounds normal.  Neurological: She is alert and oriented to person, place, and time.  Skin: Skin is warm.  Psychiatric: She has a normal mood and affect. Her behavior is normal. Judgment and thought content normal.   BP 116/51 mmHg  Pulse 73  Temp(Src) 97.2 F (36.2 C) (Oral)  Ht '5\' 5"'$  (1.651 m)  Wt 117 lb (53.071 kg)  BMI 19.47 kg/m2       Assessment & Plan:  1. Abscessed tooth appointment with dentist ASAP - clindamycin (CLEOCIN) 300 MG capsule; Take 1 capsule (300 mg total) by mouth 4 (four) times daily.  Dispense: 40 capsule; Refill: 0  Mary-Margaret Hassell Done, FNP

## 2015-02-09 ENCOUNTER — Other Ambulatory Visit: Payer: Self-pay | Admitting: Nurse Practitioner

## 2015-02-10 NOTE — Telephone Encounter (Signed)
Last seen 01/19/15  MMM   If approved route to nurse to call into [facility]

## 2015-02-10 NOTE — Telephone Encounter (Signed)
Please call in xanax with 1 refills 

## 2015-02-11 NOTE — Telephone Encounter (Signed)
rx called into pharmacy

## 2015-03-16 ENCOUNTER — Encounter: Payer: Self-pay | Admitting: Internal Medicine

## 2015-04-09 ENCOUNTER — Other Ambulatory Visit: Payer: Self-pay | Admitting: Nurse Practitioner

## 2015-04-10 NOTE — Telephone Encounter (Signed)
Please call in xanax with 1 refills 

## 2015-04-10 NOTE — Telephone Encounter (Signed)
Last filled 03/12/15. Call in

## 2015-04-13 NOTE — Telephone Encounter (Signed)
rx called into pharmacy

## 2015-05-25 ENCOUNTER — Ambulatory Visit: Payer: Medicare Other | Admitting: Nurse Practitioner

## 2015-05-26 ENCOUNTER — Encounter: Payer: Self-pay | Admitting: Nurse Practitioner

## 2015-06-09 ENCOUNTER — Telehealth: Payer: Self-pay | Admitting: Radiation Oncology

## 2015-06-09 ENCOUNTER — Other Ambulatory Visit: Payer: Self-pay | Admitting: Nurse Practitioner

## 2015-06-09 NOTE — Telephone Encounter (Signed)
Last seen 01/19/15  MMM If approved route to nurse to call into [facility]

## 2015-06-09 NOTE — Telephone Encounter (Signed)
/  Please call in xaxax with 0 refills ntbs

## 2015-06-09 NOTE — Telephone Encounter (Signed)
No voice mail, but i called patient to see if she would like to come in sooner for her appointment, 5/30

## 2015-06-11 ENCOUNTER — Telehealth: Payer: Self-pay | Admitting: *Deleted

## 2015-06-11 ENCOUNTER — Ambulatory Visit (HOSPITAL_COMMUNITY): Payer: Medicare Other

## 2015-06-11 NOTE — Telephone Encounter (Signed)
CALLED PATIENT TO ASK ABOUT MISSING TEST, LVM FOR A RETURN CALL

## 2015-06-12 ENCOUNTER — Ambulatory Visit
Admission: RE | Admit: 2015-06-12 | Discharge: 2015-06-12 | Disposition: A | Discharge status: Home / Self Care | Payer: Medicare Other | Source: Ambulatory Visit | Attending: Radiation Oncology | Admitting: Radiation Oncology

## 2015-06-12 ENCOUNTER — Telehealth: Payer: Self-pay | Admitting: *Deleted

## 2015-06-12 NOTE — Telephone Encounter (Signed)
callled patient to inform of Ct being rescheduled for 07-01-15 and her fu with Dr. Pablo Ledger on 07-02-15 to get her results, spoke with patient and she verified understanding this

## 2015-06-26 NOTE — Progress Notes (Signed)
   Weight changes, if any:  Respiratory complaints, if any:  Hemoptysis, if any:   Swallowing Problems/Pain/Difficulty swallowing: Smoking Tobacco/Marijuana/Snuff/ETOH use: Skin: Pain :  Appetite: Energy level: When is next chemo scheduled?: Lab work from of chart:

## 2015-07-01 ENCOUNTER — Ambulatory Visit (HOSPITAL_COMMUNITY)
Admission: RE | Admit: 2015-07-01 | Discharge: 2015-07-01 | Disposition: A | Discharge status: Home / Self Care | Payer: Medicare Other | Source: Ambulatory Visit | Attending: Radiation Oncology | Admitting: Radiation Oncology

## 2015-07-01 DIAGNOSIS — C3431 Malignant neoplasm of lower lobe, right bronchus or lung: Secondary | ICD-10-CM | POA: Insufficient documentation

## 2015-07-01 DIAGNOSIS — R918 Other nonspecific abnormal finding of lung field: Secondary | ICD-10-CM | POA: Diagnosis not present

## 2015-07-01 DIAGNOSIS — J479 Bronchiectasis, uncomplicated: Secondary | ICD-10-CM | POA: Insufficient documentation

## 2015-07-01 DIAGNOSIS — C3432 Malignant neoplasm of lower lobe, left bronchus or lung: Secondary | ICD-10-CM | POA: Diagnosis not present

## 2015-07-02 ENCOUNTER — Ambulatory Visit
Admission: RE | Admit: 2015-07-02 | Discharge: 2015-07-02 | Disposition: A | Discharge status: Home / Self Care | Payer: Medicare Other | Source: Ambulatory Visit | Attending: Radiation Oncology | Admitting: Radiation Oncology

## 2015-07-02 ENCOUNTER — Encounter: Payer: Self-pay | Admitting: Radiation Oncology

## 2015-07-02 VITALS — BP 139/74 | HR 63 | Temp 98.2°F | Resp 16 | Wt 123.7 lb

## 2015-07-02 DIAGNOSIS — R0602 Shortness of breath: Secondary | ICD-10-CM | POA: Insufficient documentation

## 2015-07-02 DIAGNOSIS — K589 Irritable bowel syndrome without diarrhea: Secondary | ICD-10-CM | POA: Diagnosis not present

## 2015-07-02 DIAGNOSIS — Z79899 Other long term (current) drug therapy: Secondary | ICD-10-CM | POA: Insufficient documentation

## 2015-07-02 DIAGNOSIS — Z885 Allergy status to narcotic agent status: Secondary | ICD-10-CM | POA: Insufficient documentation

## 2015-07-02 DIAGNOSIS — K279 Peptic ulcer, site unspecified, unspecified as acute or chronic, without hemorrhage or perforation: Secondary | ICD-10-CM | POA: Diagnosis not present

## 2015-07-02 DIAGNOSIS — Z9889 Other specified postprocedural states: Secondary | ICD-10-CM | POA: Diagnosis not present

## 2015-07-02 DIAGNOSIS — C3432 Malignant neoplasm of lower lobe, left bronchus or lung: Secondary | ICD-10-CM | POA: Diagnosis not present

## 2015-07-02 DIAGNOSIS — E785 Hyperlipidemia, unspecified: Secondary | ICD-10-CM | POA: Diagnosis not present

## 2015-07-02 DIAGNOSIS — Z923 Personal history of irradiation: Secondary | ICD-10-CM | POA: Insufficient documentation

## 2015-07-02 DIAGNOSIS — K219 Gastro-esophageal reflux disease without esophagitis: Secondary | ICD-10-CM | POA: Insufficient documentation

## 2015-07-02 DIAGNOSIS — Z9049 Acquired absence of other specified parts of digestive tract: Secondary | ICD-10-CM | POA: Insufficient documentation

## 2015-07-02 DIAGNOSIS — F419 Anxiety disorder, unspecified: Secondary | ICD-10-CM | POA: Insufficient documentation

## 2015-07-02 DIAGNOSIS — F17210 Nicotine dependence, cigarettes, uncomplicated: Secondary | ICD-10-CM | POA: Diagnosis not present

## 2015-07-02 DIAGNOSIS — Z880 Allergy status to penicillin: Secondary | ICD-10-CM | POA: Diagnosis not present

## 2015-07-02 DIAGNOSIS — J449 Chronic obstructive pulmonary disease, unspecified: Secondary | ICD-10-CM | POA: Diagnosis not present

## 2015-07-02 DIAGNOSIS — F329 Major depressive disorder, single episode, unspecified: Secondary | ICD-10-CM | POA: Diagnosis not present

## 2015-07-02 NOTE — Progress Notes (Signed)
Radiation Oncology         [phone]) [phone] ________________________________  Name: [name] MRN: [id number]  Date: 07/02/2015  DOB: 1932/12/24     Follow-Up Visit Note  CC: Chevis Pretty, FNP  Rigoberto Noel, MD  Diagnosis:   82 year old woman with stage I NSCLC of the Left Lower Lobe without tissue confirmation    ICD-9-CM ICD-10-CM   1. Malignant neoplasm of lower lobe of left lung (HCC) 162.5 C34.32     Interval Since Last Radiation:  15 months (04/30/2014)  04/23/14-04/30/14: SBRT, 54Gy  to the LLL  Narrative:  In summary, this is a patient who was found to have a lesion in the left lower lobe on imaging, which characteristically favor a malignant abnormality. She was not felt to be appropriate medical candidate to undergo bronchoscopy for tissue biopsy was T guidance. She subsequently reviewed the risks and benefits of this with Dr. Pablo Ledger and elected to undergo definitive SERT which she completed in April 2016. She's been followed in surveillance with serial CT scans, and has been without evidence of disease. She comes today after undergoing a repeat scan yesterday which revealed 2 mm of change in one dimension of the lesion and changes consistent with bronchiectasis and other areas of the lung. No concern for progressive disease was noted.  On review of systems, the patient reports that overall she is doing pretty well but continues to have episodes of shortness of breath with even the slightest of exertion. She also continues to have occasional cough with productive sputum and describes this as being a light white to yellow.  She denies any chest pain, fevers, chills, night sweats, unintended weight changes. She denies any bowel or bladder disturbances, and denies abdominal pain, nausea or vomiting. She denies any new musculoskeletal or joint aches or pains. A complete review of systems is obtained and is otherwise negative.      Past Medical History:  Past Medical  History  Diagnosis Date  . Anxiety disorder   . GERD (gastroesophageal reflux disease)   . COPD (chronic obstructive pulmonary disease) ([place])   . Depression   . Hyperlipidemia   . Peptic ulcer disease   . Pneumonia 2007    prolonged ICU stay on vent with trach  . Pulmonary embolism ([place]) 2004    off anticoagulation  . Colon adenoma   . Pseudotumor cerebri   . Irritable bowel syndrome   . Lymphocytic colitis 12/15/2011  . Fall at home May 17, 2012    Pt. fell in tub today  . Radiation 4/13,4/15,04/30/14    Left lower lung  . Cancer [facility])     Past Surgical History: Past Surgical History  Procedure Laterality Date  . Cholecystectomy    . Colon resection    . Abdominal hysterectomy    . Brain surgery      10 YEARS AGO   . Cataract extraction      BOTH EYES  . Upper gastrointestinal endoscopy    . Colonoscopy    . Lower extremity stents      Per Dr. Oneida Alar  . Abdominal aortagram N/A 08/10/2012    Procedure: ABDOMINAL Maxcine Ham;  Surgeon: Elam Dutch, MD;  Location: [facility] CATH LAB;  Service: Cardiovascular;  Laterality: N/A;  . Insertion of iliac stent Bilateral 08/10/2012    Procedure: INSERTION OF ILIAC STENT;  Surgeon: Elam Dutch, MD;  Location: [facility] CATH LAB;  Service: Cardiovascular;  Laterality: Bilateral;    Social History:  Social History   Social History  . Marital Status: Widowed    Spouse Name: N/A  . Number of Children: N/A  . Years of Education: N/A   Occupational History  . retired    Social History Main Topics  . Smoking status: Current Every Day Smoker -- 0.10 packs/day for 60 years    Types: Cigarettes  . Smokeless tobacco: Never Used     Comment: pt states "I will wait until all of this is over".  Heaviest   . Alcohol Use: No  . Drug Use: No  . Sexual Activity: No   Other Topics Concern  . Not on file   Social History Narrative    Family History: Family History  Problem Relation Age of Onset  . Lung cancer Father   . Colon cancer  Neg Hx   . Cancer Mother   . Hyperlipidemia Mother   . Hypertension Mother   . Heart attack Mother   . Hyperlipidemia Sister                                  ALLERGIES:  is allergic to codeine; penicillins; and sulfa antibiotics.  Meds: Current Outpatient Prescriptions  Medication Sig Dispense Refill  . ALPRAZolam (XANAX) 0.5 MG tablet TAKE  (1)  TABLET TWICE A DAY. 60 tablet 0  . aspirin EC 325 MG tablet Take 325 mg by mouth daily.    . budesonide-formoterol (SYMBICORT) 160-4.5 MCG/ACT inhaler Inhale 2 puffs into the lungs 2 (two) times daily.    . Cholecalciferol (VITAMIN D) 2000 UNITS tablet Take 2,000 Units by mouth daily.    . colestipol (COLESTID) 1 G tablet TAKE (2) TABLETS TWICE DAILY. 120 tablet 11  . CRANBERRY PO Take 1 tablet by mouth daily.    . Guaifenesin (MUCINEX MAXIMUM STRENGTH) 1200 MG TB12 Take 1,200 mg by mouth every morning.    Marland Kitchen oxybutynin (DITROPAN) 5 MG tablet TAKE (1) TABLET TWICE A DAY. 60 tablet 0  . pantoprazole (PROTONIX) 40 MG tablet TAKE 1 TABLET DAILY 30 tablet 0  . vitamin E 400 UNIT capsule Take 400 Units by mouth daily.    Marland Kitchen albuterol (PROVENTIL HFA;VENTOLIN HFA) 108 (90 BASE) MCG/ACT inhaler Inhale 2 puffs into the lungs every 4 (four) hours as needed for wheezing or shortness of breath. (Patient not taking: Reported on 07/02/2015) 8.5 g 5   No current facility-administered medications for this encounter.    Physical Findings:  weight is 123 lb 11.2 oz (56.11 kg). Her oral temperature is 98.2 F (36.8 C). Her blood pressure is 139/74 and her pulse is 63. Her respiration is 16 and oxygen saturation is 92%.    In general this is a well appearing Caucasian female in no acute distress. She is alert and oriented x4 and appropriate throughout the examination. HEENT reveals that the patient is normocephalic, atraumatic. EOMs are intact. PERRLA. Skin is intact without any evidence of gross lesions. Cardiovascular exam reveals a regular rate and rhythm, no  clicks rubs or murmurs are auscultated. Chest is clear to auscultation bilaterally. Lymphatic assessment is performed and does not reveal any adenopathy in the cervical, supraclavicular, axillary, or inguinal chains. Abdomen has active bowel sounds in all quadrants and is intact.    Lab Findings: Lab Results  Component Value Date   WBC 13.8* 02/05/2014   WBC 11.0* 07/31/2013   HGB 11.9* 02/05/2014   HCT 35.7* 02/05/2014  PLT 515.0* 02/05/2014    Lab Results  Component Value Date   NA 139 11/14/2014   NA 134* 01/23/2014   K 5.1 11/14/2014   CO2 27 11/14/2014   GLUCOSE 84 11/14/2014   GLUCOSE 128* 01/23/2014   BUN 19 11/14/2014   BUN 16 01/23/2014   CREATININE 0.61 11/14/2014   BILITOT <0.2 11/14/2014   BILITOT 0.3 01/23/2014   ALKPHOS 78 11/14/2014   AST 22 11/14/2014   ALT 15 11/14/2014   PROT 6.8 11/14/2014   PROT 7.2 01/23/2014   ALBUMIN 3.7 11/14/2014   ALBUMIN 3.6 01/23/2014   CALCIUM 9.2 11/14/2014   ANIONGAP 15 01/23/2014    Radiographic Findings: Ct Chest Wo Contrast  07/01/2015  CLINICAL DATA:  Followup lung cancer.  History of lung nodule. EXAM: CT CHEST WITHOUT CONTRAST TECHNIQUE: Multidetector CT imaging of the chest was performed following the standard protocol without IV contrast. COMPARISON:  11/28/2014 FINDINGS: Mediastinum/Lymph Nodes: The heart size appears normal. Aortic atherosclerosis noted. Calcification in the RCA, LAD and left circumflex coronary artery is identified. Right paratracheal lymph node measures 11 mm and is unchanged from previous exam. No new or enlarging mediastinal or hilar adenopathy. No enlarged axillary or supraclavicular lymph nodes. Lungs/Pleura: There is no pleural fluid. Moderate to advanced changes of centrilobular and paraseptal emphysema identified. The index nodule in the superior segment of the left lower lobe has irregular and linear nonspecific appearance measuring 1.3 x 0.5 cm, image 60 of series 5. Previously 1.1 x 0.5 cm.  There is surrounding scarring and architectural distortion. Within the left lower lobe there is progressive bronchial wall thickening, bronchiectasis and tree-in-bud nodularity. Likely post infectious/inflammatory. The appearance is slightly progressive when compared with 11/28/2014. The right lung appears clear. Upper abdomen: A cyst is noted within the left lobe of liver, unchanged. Previous cholecystectomy. Visualized portions of the adrenal glands, spleen and kidneys are unremarkable. Musculoskeletal: Mild scoliosis and degenerative disc disease is identified. No aggressive lytic or sclerotic bone lesions noted. IMPRESSION: 1. No specific findings identified within the lungs to suggest recurrence of tumor. Allowing for differences in technique and progressive scarring and architectural distortion in this area there has been no significant interval change in the index lesion involving the superior segment of the left lower lobe. 2. Progression of bronchial wall thickening, bronchiectasis and tree-in-bud nodularity within the left lower lobe which is likely post inflammatory/infectious in etiology. Cannot rule out chronic aspiration. Clinical correlation suggested. Electronically Signed   By: Kerby Moors M.D.   On: 07/01/2015 15:34    Impression/Plan: 1. Putative Stage Ia carcinoma of the left lower lobe of the lung. Findings were reviewed from the patient's recent CT scan. We discussed these appear to be stable and consistent with radio therapy effect. She is encouraged to return in 6 months time for repeat CT scan, and will be seen in survivor's clinic for this visit. She states agreement and understanding and is encouraged to keep [place] informed of questions or concerns. We will plan to see her back in one year's time so that she continues to be seen every 6 months by 1 of the provider's urine cancer Center. She states agreement and understanding of this. 2. Radiographic evidence of bronchiectasis with a  history of COPD. The patient has not been seen by pulmonology and quite some time, and does continue to have some signs or symptoms of cough and exertional shortness of breath. We have recommended that she be formally reevaluated by Dr. Elsworth Soho. A referral  will be placed.  The above documentation reflects my direct findings during this shared patient visit. Please see the separate note by Dr. Tammi Klippel on this date for the remainder of the patient's plan of care.    Carola Rhine, PAC

## 2015-07-02 NOTE — Progress Notes (Signed)
Weight and vitals stable. Denies pain. Reports an occasional productive cough with yellow sputum. Reports shortness of breath with mild exertion. Denies hemoptysis. Audible congestion noted. Denies pain or difficulty swallowing. Reports her stamina hasn't improved. Explains she continues to supplement her diet with Ensure. Explains she recently had all her teeth pulled and has a new set of dentures.   BP 139/74 mmHg  Pulse 63  Temp(Src) 98.2 F (36.8 C) (Oral)  Resp 16  Wt 123 lb 11.2 oz (56.11 kg)  SpO2 92% Wt Readings from Last 3 Encounters:  07/02/15 123 lb 11.2 oz (56.11 kg)  01/19/15 117 lb (53.071 kg)  12/11/14 113 lb 4.8 oz (51.393 kg)

## 2015-07-03 ENCOUNTER — Other Ambulatory Visit: Payer: Self-pay | Admitting: Radiation Oncology

## 2015-07-03 ENCOUNTER — Telehealth: Payer: Self-pay | Admitting: *Deleted

## 2015-07-03 DIAGNOSIS — C3432 Malignant neoplasm of lower lobe, left bronchus or lung: Secondary | ICD-10-CM

## 2015-07-03 DIAGNOSIS — J439 Emphysema, unspecified: Secondary | ICD-10-CM

## 2015-07-03 NOTE — Addendum Note (Signed)
Encounter addended by: Heywood Footman, RN on: 07/03/2015  9:18 AM<BR>     Documentation filed: Charges VN

## 2015-07-03 NOTE — Telephone Encounter (Signed)
CALLED PATIENT TO INFORM OF APPT. WITH SARAH GROCE  ON 07-23-15 - ARRIVAL TIME - 10:55 AM , AND FU VISIT ON 06-30-2016 @ 3:30 PM WITH ALISON PERKINS, SPOKE WITH PATIENT AND SHE IS AWARE OF THESE APPTS.

## 2015-07-06 ENCOUNTER — Encounter: Payer: Medicare Other | Admitting: Adult Health

## 2015-07-10 ENCOUNTER — Other Ambulatory Visit: Payer: Self-pay | Admitting: Nurse Practitioner

## 2015-07-13 NOTE — Telephone Encounter (Signed)
Please call in xanax with 1 refills 

## 2015-07-13 NOTE — Telephone Encounter (Signed)
Last seen 01/19/15 MMM If approved route to nurse to call into [facility]

## 2015-07-13 NOTE — Telephone Encounter (Signed)
rx called into pharmacy

## 2015-07-20 ENCOUNTER — Telehealth: Payer: Self-pay | Admitting: *Deleted

## 2015-07-20 NOTE — Telephone Encounter (Signed)
Tc from patient to confirm appts in the next year.

## 2015-07-23 ENCOUNTER — Ambulatory Visit (INDEPENDENT_AMBULATORY_CARE_PROVIDER_SITE_OTHER): Payer: Medicare Other | Admitting: Acute Care

## 2015-07-23 ENCOUNTER — Encounter: Payer: Self-pay | Admitting: Acute Care

## 2015-07-23 VITALS — BP 110/64 | HR 61 | Ht 65.0 in | Wt 123.0 lb

## 2015-07-23 DIAGNOSIS — C3432 Malignant neoplasm of lower lobe, left bronchus or lung: Secondary | ICD-10-CM | POA: Diagnosis not present

## 2015-07-23 DIAGNOSIS — J432 Centrilobular emphysema: Secondary | ICD-10-CM | POA: Diagnosis not present

## 2015-07-23 DIAGNOSIS — J449 Chronic obstructive pulmonary disease, unspecified: Secondary | ICD-10-CM | POA: Diagnosis not present

## 2015-07-23 DIAGNOSIS — IMO0001 Reserved for inherently not codable concepts without codable children: Secondary | ICD-10-CM

## 2015-07-23 MED ORDER — ALBUTEROL SULFATE HFA 108 (90 BASE) MCG/ACT IN AERS: 2.0000 | INHALATION_SPRAY | RESPIRATORY_TRACT | Status: DC | PRN

## 2015-07-23 MED ORDER — BUDESONIDE-FORMOTEROL FUMARATE 160-4.5 MCG/ACT IN AERO: 2.0000 | INHALATION_SPRAY | Freq: Two times a day (BID) | RESPIRATORY_TRACT | Status: DC

## 2015-07-23 NOTE — Assessment & Plan Note (Signed)
Stable Emphysema after radiation treatment for Stage 1 A NSCLC Plan: Remember to use your Symbicort Inhaler 2 puffs twice daily. This is your maintenance medication, take it every day without fail. Proventil is your rescue inhaler. Use this for break through shortness of breath or wheezing up to every 6 hours. Continue your Ensure for your weight. Follow up appointment with Dr. Elsworth Soho in 3 months. Repeat CT scan per oncology in 6 months through the survivors clinic. Please contact office for sooner follow up if symptoms do not improve or worsen or seek emergency care

## 2015-07-23 NOTE — Progress Notes (Signed)
History of Present Illness [name] is a 82 y.o. female with mild COPD and Stage 1 NSCLC of the LLL without tissue confirmation. folowed by Dr. Elsworth Soho.   07/23/2015  Patient presents to the office today to re-establish with the practice after completion of radiation therapy for Stage 1A. NSCLC of the LLL.  She states that her cancer is stable after completion of radiation treatment. We spent some time today educating her about the difference between maintenance medications and rescue medications. She has been using her Symbicort on an as needed basis, instead of as maintenance. She has not used her rescue inhaler in months.With clarification she is going to start using her Symbicort  every day, with use of Proventil as rescue for breakthrough. She states her breathing is at baseline. She has rare secretions, and does not know the color of them. She denies fever, chest pain, purulent secretions, orthopnea or hemoptysis.She is very thin, but states she has actually gained weight using Ensure supplements.She continues to smoke 1/2 ppd. and refused the  card offered with information about smoking cessation and free nicotine replacement options.   Tests  IMPRESSION: 1. No specific findings identified within the lungs to suggest recurrence of tumor. Allowing for differences in technique and progressive scarring and architectural distortion in this area there has been no significant interval change in the index lesion involving the superior segment of the left lower lobe. 2. Progression of bronchial wall thickening, bronchiectasis and tree-in-bud nodularity within the left lower lobe which is likely post inflammatory/infectious in etiology. Cannot rule out chronic aspiration. Clinical correlation suggested.  Past medical hx Past Medical History  Diagnosis Date  . Anxiety disorder   . GERD (gastroesophageal reflux disease)   . COPD (chronic obstructive pulmonary disease) ([place])   .  Depression   . Hyperlipidemia   . Peptic ulcer disease   . Pneumonia 2007    prolonged ICU stay on vent with trach  . Pulmonary embolism ([place]) 2004    off anticoagulation  . Colon adenoma   . Pseudotumor cerebri   . Irritable bowel syndrome   . Lymphocytic colitis 12/15/2011  . Fall at home May 17, 2012    Pt. fell in tub today  . Radiation 4/13,4/15,04/30/14    Left lower lung  . Cancer [facility])      Past surgical hx, Family hx, Social hx all reviewed.  Current Outpatient Prescriptions on File Prior to Visit  Medication Sig  . ALPRAZolam (XANAX) 0.5 MG tablet TAKE  (1)  TABLET TWICE A DAY.  Marland Kitchen aspirin EC 325 MG tablet Take 325 mg by mouth daily.  . budesonide-formoterol (SYMBICORT) 160-4.5 MCG/ACT inhaler Inhale 2 puffs into the lungs 2 (two) times daily.  . Cholecalciferol (VITAMIN D) 2000 UNITS tablet Take 2,000 Units by mouth daily.  . colestipol (COLESTID) 1 G tablet TAKE (2) TABLETS TWICE DAILY.  Marland Kitchen CRANBERRY PO Take 1 tablet by mouth daily.  . Guaifenesin (MUCINEX MAXIMUM STRENGTH) 1200 MG TB12 Take 1,200 mg by mouth every morning.  Marland Kitchen oxybutynin (DITROPAN) 5 MG tablet TAKE (1) TABLET TWICE A DAY.  . pantoprazole (PROTONIX) 40 MG tablet TAKE 1 TABLET DAILY  . vitamin E 400 UNIT capsule Take 400 Units by mouth daily.   No current facility-administered medications on file prior to visit.     Allergies  Allergen Reactions  . Codeine Anaphylaxis  . Penicillins Anaphylaxis  . Sulfa Antibiotics Anaphylaxis    Review Of Systems:  Constitutional:   No  weight loss, night sweats,  Fevers, chills,+  fatigue, or  lassitude.  HEENT:   No headaches,  Difficulty swallowing,  Tooth/dental problems, or  Sore throat,                No sneezing, itching, ear ache, nasal congestion, post nasal drip,   CV:  No chest pain,  Orthopnea, PND, swelling in lower extremities, anasarca, dizziness, palpitations, syncope.   GI  No heartburn, indigestion, abdominal pain, nausea, vomiting,  diarrhea, change in bowel habits, loss of appetite, bloody stools.   Resp: + shortness of breath with exertion not at rest.  No excess mucus, no productive cough,  No non-productive cough,  No coughing up of blood.  No change in color of mucus.  No wheezing.  No chest wall deformity  Skin: no rash or lesions.  GU: no dysuria, change in color of urine, no urgency or frequency.  No flank pain, no hematuria   MS:  No joint pain or swelling.  No decreased range of motion.  No back pain.  Psych:  No change in mood or affect. No depression or anxiety.  No memory loss.   Vital Signs BP 110/64 mmHg  Pulse 61  Ht '5\' 5"'$  (1.651 m)  Wt 123 lb (55.792 kg)  BMI 20.47 kg/m2  SpO2 97%   Physical Exam:  General- No distress,  A&Ox3, frail, thin female ENT: No sinus tenderness, TM clear, pale nasal mucosa, no oral exudate,no post nasal drip, no LAN Cardiac: S1, S2, regular rate and rhythm, no murmur Chest: No wheeze/ rales/ diminished per bases bilaterally; no accessory muscle use, no nasal flaring, no sternal retractions Abd.: Soft Non-tender Ext: No clubbing cyanosis, edema Neuro:  normal strength Skin: No rashes, warm and dry Psych: normal mood and behavior   Assessment/Plan  Centrilobular emphysema Stable Emphysema after radiation treatment for Stage 1 A NSCLC Plan: Remember to use your Symbicort Inhaler 2 puffs twice daily. This is your maintenance medication, take it every day without fail. Proventil is your rescue inhaler. Use this for break through shortness of breath or wheezing up to every 6 hours. Continue your Ensure for your weight. Follow up appointment with Dr. Elsworth Soho in 3 months. Repeat CT scan per oncology in 6 months through the survivors clinic. Please contact office for sooner follow up if symptoms do not improve or worsen or seek emergency care    Lung cancer [facility]) Non Small Cell LLL Lung Cancer  Stage 1A Completed radiation treatment 07/2015 Plan: Continued CT  surveillance per oncology through Survivorship program in 6 months. ( To be scheduled by oncology)      Magdalen Spatz, NP 07/23/2015  4:18 PM

## 2015-07-23 NOTE — Patient Instructions (Addendum)
It is nice to meet you today. Remember to use your Symbicort Inhaler 2 puffs twice daily. This is your maintenance medication, take it every day without fail. Proventil is your rescue inhaler. Use this for break through shortness of breath or wheezing up to every 6 hours. Continue your Ensure for your weight. Follow up appointment with Dr. Elsworth Soho in 3 months. Repeat CT scan per oncology in 6 months through the survivors clinic. Please contact office for sooner follow up if symptoms do not improve or worsen or seek emergency care

## 2015-07-23 NOTE — Assessment & Plan Note (Signed)
Non Small Cell LLL Lung Cancer  Stage 1A Completed radiation treatment 07/2015 Plan: Continued CT surveillance per oncology through Survivorship program in 6 months. ( To be scheduled by oncology)

## 2015-08-06 ENCOUNTER — Other Ambulatory Visit: Payer: Self-pay | Admitting: Nurse Practitioner

## 2015-08-06 DIAGNOSIS — Z1231 Encounter for screening mammogram for malignant neoplasm of breast: Secondary | ICD-10-CM

## 2015-08-15 ENCOUNTER — Other Ambulatory Visit: Payer: Self-pay | Admitting: Nurse Practitioner

## 2015-08-18 ENCOUNTER — Ambulatory Visit
Admission: RE | Admit: 2015-08-18 | Discharge: 2015-08-18 | Disposition: A | Discharge status: Home / Self Care | Payer: Medicare Other | Source: Ambulatory Visit | Attending: Nurse Practitioner | Admitting: Nurse Practitioner

## 2015-08-18 DIAGNOSIS — Z1231 Encounter for screening mammogram for malignant neoplasm of breast: Secondary | ICD-10-CM

## 2015-08-19 ENCOUNTER — Other Ambulatory Visit: Payer: Self-pay | Admitting: Nurse Practitioner

## 2015-09-08 ENCOUNTER — Encounter: Payer: Self-pay | Admitting: Vascular Surgery

## 2015-09-17 ENCOUNTER — Ambulatory Visit (INDEPENDENT_AMBULATORY_CARE_PROVIDER_SITE_OTHER): Payer: Medicare Other | Admitting: Vascular Surgery

## 2015-09-17 ENCOUNTER — Encounter: Payer: Self-pay | Admitting: Vascular Surgery

## 2015-09-17 ENCOUNTER — Ambulatory Visit (HOSPITAL_COMMUNITY)
Admission: RE | Admit: 2015-09-17 | Discharge: 2015-09-17 | Disposition: A | Discharge status: Home / Self Care | Payer: Medicare Other | Source: Ambulatory Visit | Attending: Vascular Surgery | Admitting: Vascular Surgery

## 2015-09-17 VITALS — BP 152/73 | HR 64 | Temp 97.5°F | Resp 18 | Ht 65.5 in | Wt 124.0 lb

## 2015-09-17 DIAGNOSIS — I739 Peripheral vascular disease, unspecified: Secondary | ICD-10-CM | POA: Diagnosis not present

## 2015-09-17 DIAGNOSIS — R42 Dizziness and giddiness: Secondary | ICD-10-CM | POA: Insufficient documentation

## 2015-09-17 DIAGNOSIS — R0989 Other specified symptoms and signs involving the circulatory and respiratory systems: Secondary | ICD-10-CM

## 2015-09-17 DIAGNOSIS — I6521 Occlusion and stenosis of right carotid artery: Secondary | ICD-10-CM | POA: Diagnosis not present

## 2015-09-17 DIAGNOSIS — I878 Other specified disorders of veins: Secondary | ICD-10-CM

## 2015-09-17 LAB — VAS US CAROTID
LCCADDIAS: 14 cm/s
LEFT ECA DIAS: -5 cm/s
Left CCA dist sys: 54 cm/s
Left CCA prox dias: 13 cm/s
Left CCA prox sys: 69 cm/s
Left ICA dist dias: -30 cm/s
Left ICA dist sys: -101 cm/s
Left ICA prox dias: 19 cm/s
Left ICA prox sys: 105 cm/s
RCCADSYS: -67 cm/s
RCCAPDIAS: 11 cm/s
RCCAPSYS: 73 cm/s
RIGHT CCA MID DIAS: 12 cm/s
RIGHT ECA DIAS: -38 cm/s

## 2015-09-17 NOTE — Progress Notes (Signed)
[facility] Progress Note   Subjective:  Patient is an 82 year old female. Patient is here for f/u from bilateral external iliac stenting (08/2012). Her shortness of breath usually interferes with exercise first. She is able to walk half a block before she is short of breath. She denies any rest pain at night. She currently is able to ambulate as much as she wishes. She has fairly significant pulmonary dysfunction and required hospitalization recently for this. she was recently diagnosed with lung cancer and was found to be not a candidate for operation due to severe underlying COPD. She has recently undergone radiation therapy and is followed by Dr. Elsworth Soho.     She also complains of dizziness today. She states this has been persistent for about a year. It primarily occurs when she goes from sitting to lying back or moving from lying back to a sitting position. It does not occur every time she moves but probably about 40-50% of the time. She denies any symptoms of TIA amaurosis or stroke.   Physical Exam:    Vitals:   09/17/15 1228 09/17/15 1233  BP: (!) 150/72 (!) 152/73  Pulse: 62 64  Resp: 18   Temp: 97.5 F (36.4 C)   TempSrc: Oral   SpO2: 100%   Weight: 124 lb (56.2 kg)   Height: 5' 5.5" (1.664 m)    Neck: Left carotid bruit no right bruit 2+ carotid pulses Cardiac: Regular rate and rhythm without murmur   Pulses: palpable femoral pulses bilaterally. Palpable left popliteal pulse and palpable left DP pulse. Right popliteal, DP and PT pulses non-palpable. Extremities: Feet slightly cool to touch. No ulcerations. Seconds left first toenail     ABIs: Right- 0.66, Left -0.84.  I reviewed and interpreted this study.. She has a known chronic right superficial femoral artery occlusion.   Patient also had a carotid duplex scan today for evaluation of her dizziness symptoms and the presence of a left carotid bruit. Duplex showed 40-60% right internal carotid artery stenosis  less than 40% left internal carotid artery stenosis      Assessment:  82 y.o. female is s/p bilateral external iliac stenting 08/2012 currently asymptomatic from her lower extremities. Mild to moderate asymptomatic right internal carotid artery stenosis.   Plan: -Follow-up in 12 months for ABIs and repeat carotid duplex scan.   Ruta Hinds, MD [facility] [place] Office: [phone] Pager: [phone]

## 2015-09-18 ENCOUNTER — Other Ambulatory Visit: Payer: Self-pay | Admitting: Nurse Practitioner

## 2015-09-18 IMAGING — CT CT MAXILLOFACIAL W/O CM
3 series · 16 of 47 positions shown, 19 images · non-contrast
Comparison: CT head 11/18/2004

CLINICAL DATA: Fall.  Right facial injury.

EXAM:
CT MAXILLOFACIAL WITHOUT CONTRAST
TECHNIQUE: Multidetector CT imaging of the maxillofacial structures was
performed. Multiplanar CT image reconstructions were also generated.
A small metallic BB was placed on the right temple in order to
reliably differentiate right from left.

[Series 2: facial/ orbits 2.0 h30s · axial · 0.37mm/px · z∈[+1275,+1421]mm · 10 of 85 slices shown, 13 images]
[im 6/85  brain]
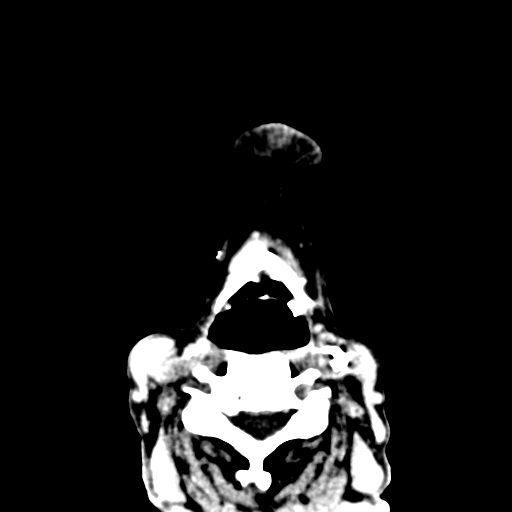
[im 6/85  bone]
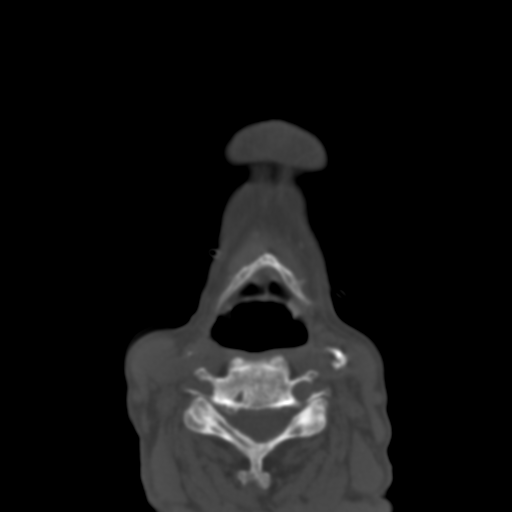
[im 15/85  bone]
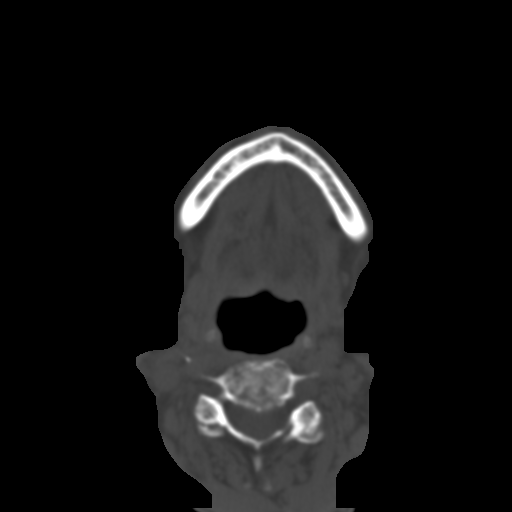
[im 24/85  bone]
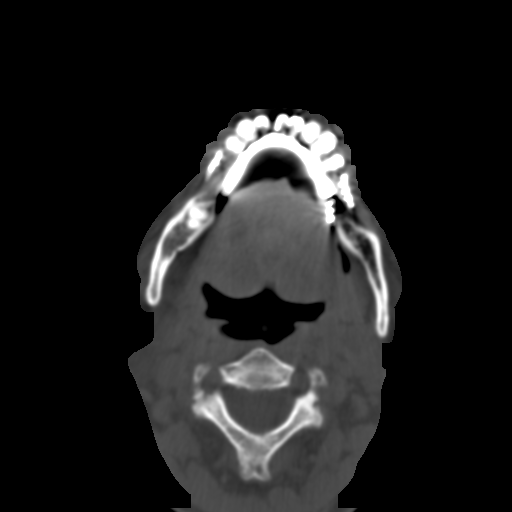
[im 29/85  bone]
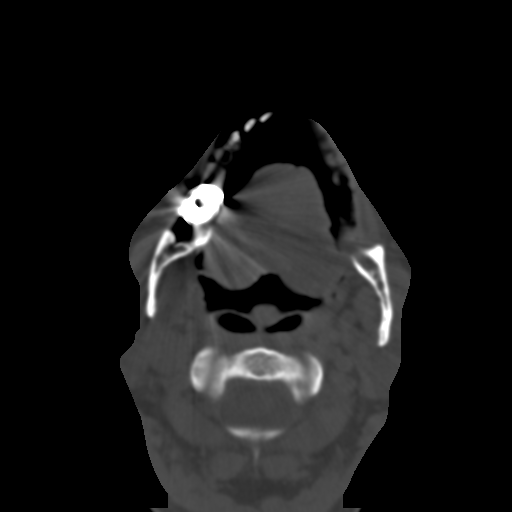
[im 38/85  brain]
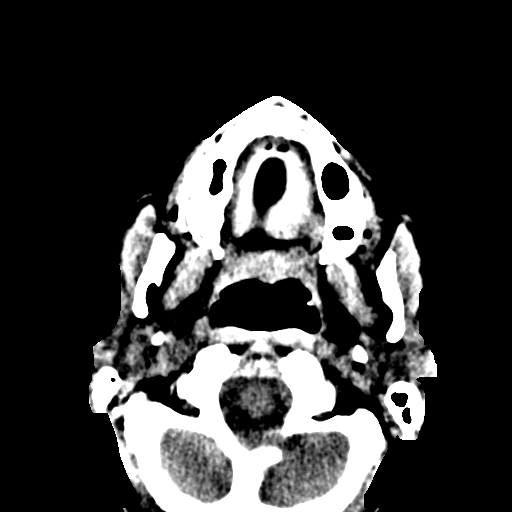
[im 38/85  bone]
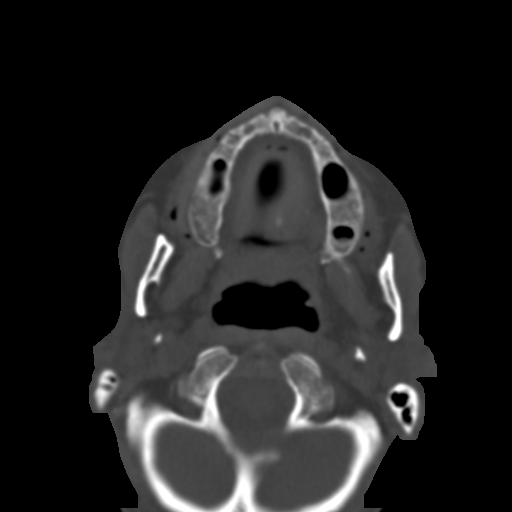
[im 47/85  bone]
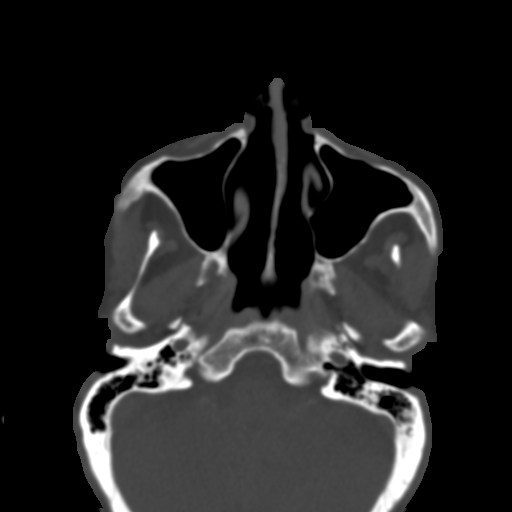
[im 56/85  bone]
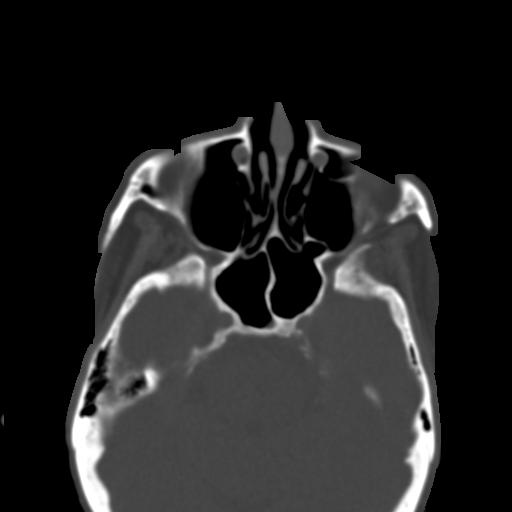
[im 64/85  bone]
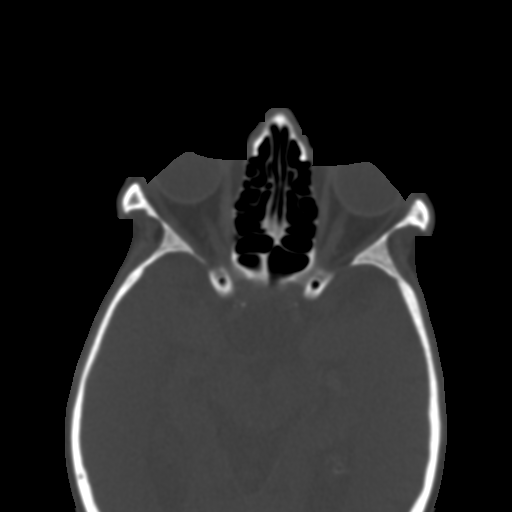
[im 70/85  brain]
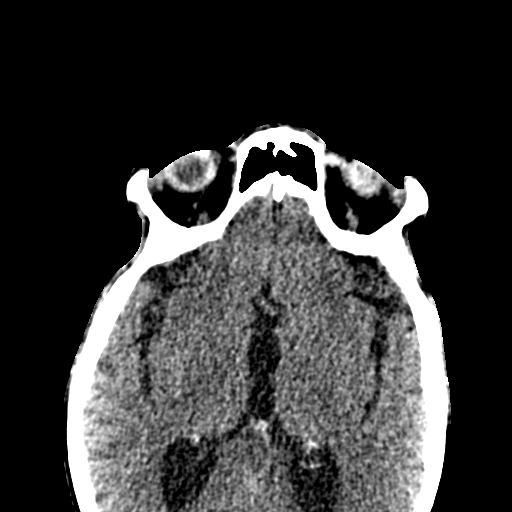
[im 70/85  bone]
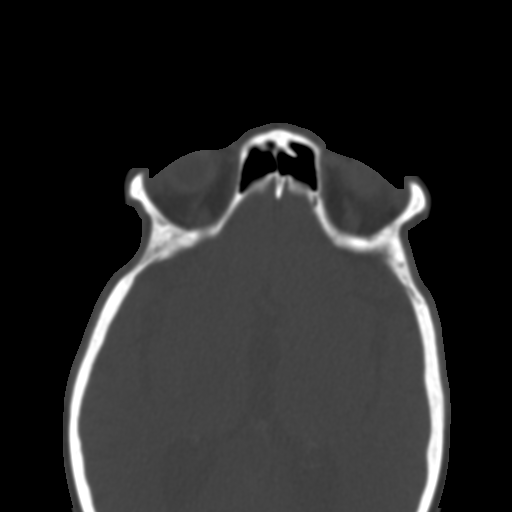
[im 79/85  bone]
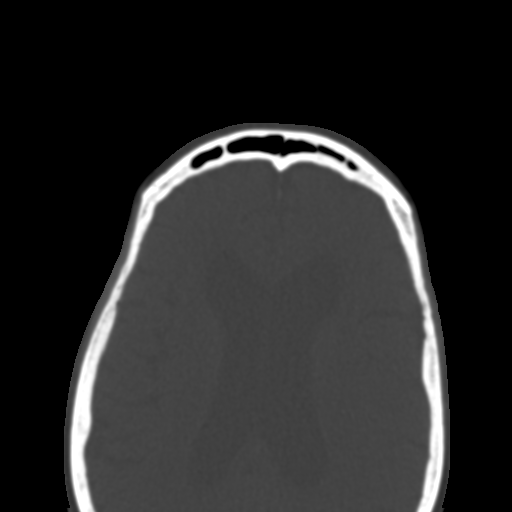

[Series 6: coronal soft tissue · coronal · 0.33mm/px · 3 of 87 slices shown]
[im 29/87  bone]
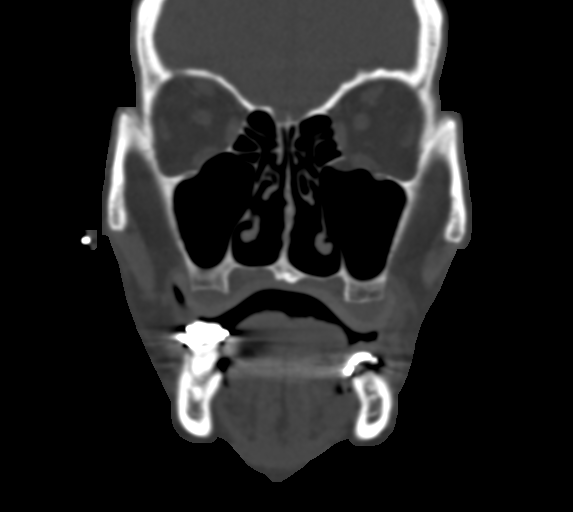
[im 39/87  bone]
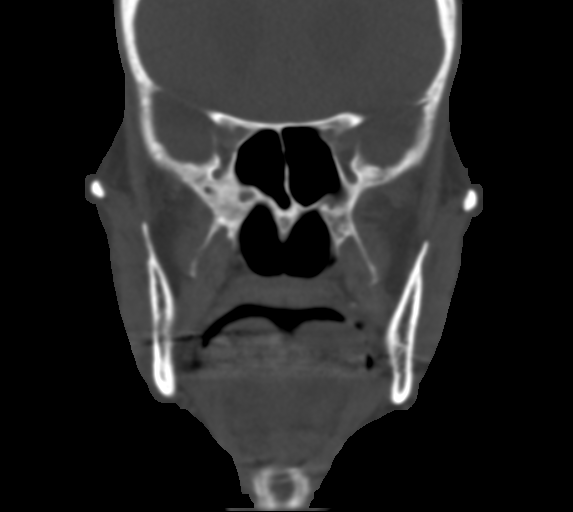
[im 48/87  bone]
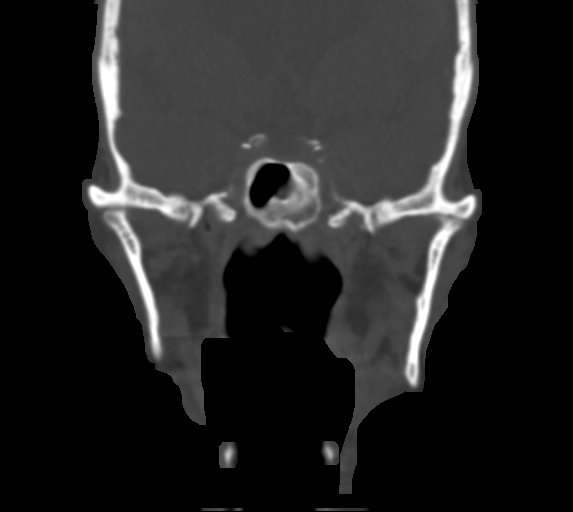

[Series 7: sagittal soft tissue · sagittal · 0.36mm/px · 3 of 71 slices shown]
[im 24/71  bone]
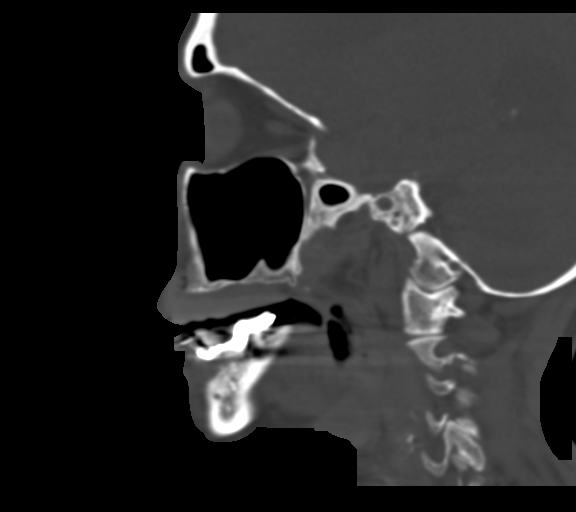
[im 36/71  bone]
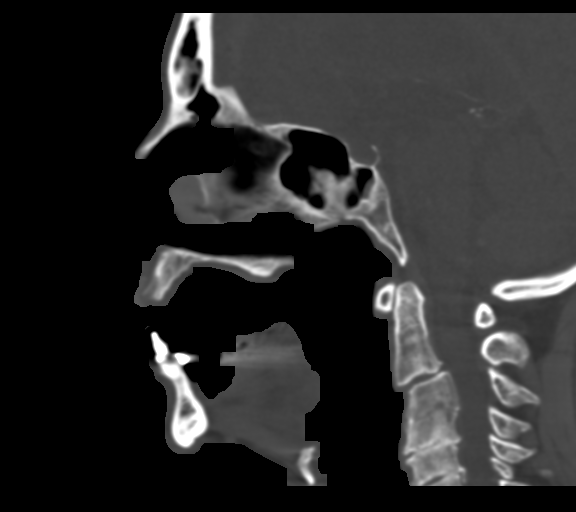
[im 47/71  bone]
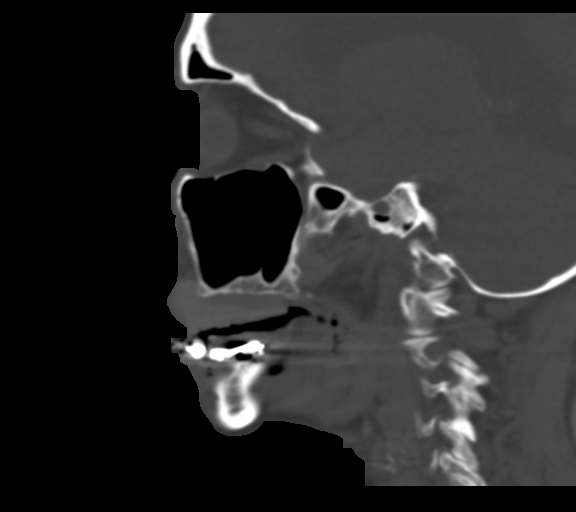

[16 of 47 positions shown; findings below may reference images not displayed]

FINDINGS: Atrophy and ventriculomegaly is unchanged.

Negative for facial fracture. No fracture of the orbit or mandible.
No air-fluid level in the paranasal sinuses. Mild mucosal thickening
in the paranasal sinuses. Cervical spondylosis.
IMPRESSION: Negative for facial fracture.

## 2015-09-18 NOTE — Telephone Encounter (Signed)
Last filled 08/15/15. If approved please route to pool and have nurse call into pharmacy.

## 2015-09-21 ENCOUNTER — Telehealth: Payer: Self-pay | Admitting: Nurse Practitioner

## 2015-09-21 NOTE — Telephone Encounter (Signed)
Please address

## 2015-09-22 MED ORDER — OXYBUTYNIN CHLORIDE 5 MG PO TABS: ORAL_TABLET | ORAL | 0 refills | Status: DC

## 2015-09-22 MED ORDER — BUDESONIDE-FORMOTEROL FUMARATE 160-4.5 MCG/ACT IN AERO: 2.0000 | INHALATION_SPRAY | Freq: Two times a day (BID) | RESPIRATORY_TRACT | 0 refills | Status: DC

## 2015-09-22 MED ORDER — PANTOPRAZOLE SODIUM 40 MG PO TBEC: 40.0000 mg | DELAYED_RELEASE_TABLET | Freq: Every day | ORAL | 0 refills | Status: DC

## 2015-09-22 MED ORDER — ALPRAZOLAM 0.5 MG PO TABS: ORAL_TABLET | ORAL | 1 refills | Status: DC

## 2015-09-22 NOTE — Telephone Encounter (Signed)
Prescription called in to pharmacy

## 2015-09-23 ENCOUNTER — Other Ambulatory Visit: Payer: Self-pay | Admitting: Nurse Practitioner

## 2015-09-23 NOTE — Telephone Encounter (Signed)
Was suppose to have been called in yesterday

## 2015-09-24 NOTE — Telephone Encounter (Signed)
Again if you look at chart the xanax was suppose to have been called in several days ago

## 2015-09-24 NOTE — Telephone Encounter (Signed)
Spoke to the pharmacist at [facility] and rx was called in yesterday and ready for pickup.

## 2015-09-24 NOTE — Telephone Encounter (Signed)
RX for Xanax called in to [facility] per MMM Pt notified

## 2015-09-28 ENCOUNTER — Encounter: Payer: Self-pay | Admitting: Nurse Practitioner

## 2015-09-28 ENCOUNTER — Ambulatory Visit (INDEPENDENT_AMBULATORY_CARE_PROVIDER_SITE_OTHER): Payer: Medicare Other | Admitting: Nurse Practitioner

## 2015-09-28 VITALS — BP 129/63 | HR 72 | Temp 97.7°F | Ht 65.0 in | Wt 126.0 lb

## 2015-09-28 DIAGNOSIS — K589 Irritable bowel syndrome without diarrhea: Secondary | ICD-10-CM

## 2015-09-28 DIAGNOSIS — R601 Generalized edema: Secondary | ICD-10-CM

## 2015-09-28 DIAGNOSIS — Z23 Encounter for immunization: Secondary | ICD-10-CM

## 2015-09-28 DIAGNOSIS — R3915 Urgency of urination: Secondary | ICD-10-CM

## 2015-09-28 DIAGNOSIS — I739 Peripheral vascular disease, unspecified: Secondary | ICD-10-CM

## 2015-09-28 DIAGNOSIS — E785 Hyperlipidemia, unspecified: Secondary | ICD-10-CM | POA: Diagnosis not present

## 2015-09-28 DIAGNOSIS — I6521 Occlusion and stenosis of right carotid artery: Secondary | ICD-10-CM | POA: Diagnosis not present

## 2015-09-28 DIAGNOSIS — E43 Unspecified severe protein-calorie malnutrition: Secondary | ICD-10-CM | POA: Diagnosis not present

## 2015-09-28 DIAGNOSIS — C3432 Malignant neoplasm of lower lobe, left bronchus or lung: Secondary | ICD-10-CM | POA: Diagnosis not present

## 2015-09-28 DIAGNOSIS — K219 Gastro-esophageal reflux disease without esophagitis: Secondary | ICD-10-CM | POA: Diagnosis not present

## 2015-09-28 MED ORDER — PANTOPRAZOLE SODIUM 40 MG PO TBEC: 40.0000 mg | DELAYED_RELEASE_TABLET | Freq: Every day | ORAL | 0 refills | Status: DC

## 2015-09-28 MED ORDER — OXYBUTYNIN CHLORIDE 5 MG PO TABS: ORAL_TABLET | ORAL | 0 refills | Status: DC

## 2015-09-28 NOTE — Addendum Note (Signed)
Addended by: Chevis Pretty on: 09/28/2015 02:44 PM   Modules accepted: Orders

## 2015-09-28 NOTE — Progress Notes (Signed)
Subjective:    Patient ID: [name], female    DOB: 1932/08/28, 82 y.o.   MRN: [id number]  HPI  Patient in today for follow up- she has not been seen in over 6 months. SHe has been getting treatments for lung cancer and has not felt well enough to ome to the office. SHe has currently completed a year of radiation. Say sthat she feels OK. She is sppose was to set up follow up appointment but has it done yet. She still smokes some. Otherwise she is doing well.  Patient Active Problem List   Diagnosis Date Noted  . Lung cancer ([place]) 11/14/2014  . Hyperlipidemia with target LDL less than 100 11/14/2014  . Centrilobular emphysema ([place]) 10/08/2013  . Protein-calorie malnutrition, severe ([place]) 06/21/2013  . Peripheral vascular disease, unspecified ([place]) 05/17/2012  . Pain in limb 05/17/2012  . Edema 02/16/2012  . IBS (irritable bowel syndrome) 02/09/2012  . Lymphocytic colitis 12/15/2011  . Personal history of adenomatous colonic polyps 12/02/2011  . GERD 10/20/2008   Outpatient Encounter Prescriptions as of 09/28/2015  Medication Sig  . albuterol (PROVENTIL HFA;VENTOLIN HFA) 108 (90 Base) MCG/ACT inhaler Inhale 2 puffs into the lungs every 4 (four) hours as needed for wheezing or shortness of breath.  . ALPRAZolam (XANAX) 0.5 MG tablet TAKE  (1)  TABLET TWICE A DAY.  Marland Kitchen aspirin EC 325 MG tablet Take 325 mg by mouth daily.  . budesonide-formoterol (SYMBICORT) 160-4.5 MCG/ACT inhaler Inhale 2 puffs into the lungs 2 (two) times daily.  . Cholecalciferol (VITAMIN D) 2000 UNITS tablet Take 2,000 Units by mouth daily.  . colestipol (COLESTID) 1 G tablet TAKE (2) TABLETS TWICE DAILY.  Marland Kitchen CRANBERRY PO Take 1 tablet by mouth daily.  . Guaifenesin (MUCINEX MAXIMUM STRENGTH) 1200 MG TB12 Take 1,200 mg by mouth every morning.  Marland Kitchen oxybutynin (DITROPAN) 5 MG tablet TAKE (1) TABLET TWICE A DAY.  . pantoprazole (PROTONIX) 40 MG tablet Take 1 tablet (40 mg total) by mouth daily.  . vitamin E 400 UNIT  capsule Take 400 Units by mouth daily.    Review of Systems  Constitutional: Negative.   HENT: Negative.   Respiratory: Positive for cough and shortness of breath.   Cardiovascular: Negative.  Negative for chest pain.  Genitourinary: Positive for urgency.  Neurological: Positive for dizziness.  Psychiatric/Behavioral: Negative.   All other systems reviewed and are negative.      Objective:   Physical Exam  Constitutional: She is oriented to person, place, and time. She appears well-developed and well-nourished.  HENT:  Nose: Nose normal.  Mouth/Throat: Oropharynx is clear and moist.  Oral mucosa dry  Eyes: EOM are normal.  Neck: Trachea normal, normal range of motion and full passive range of motion without pain. Neck supple. No JVD present. Carotid bruit is not present. No thyromegaly present.  Cardiovascular: Normal rate, regular rhythm, normal heart sounds and intact distal pulses.  Exam reveals no gallop and no friction rub.   No murmur heard. Pulmonary/Chest: Effort normal and breath sounds normal.  Abdominal: Soft. Bowel sounds are normal. She exhibits no distension and no mass. There is no tenderness.  Musculoskeletal: Normal range of motion.  Lymphadenopathy:    She has no cervical adenopathy.  Neurological: She is alert and oriented to person, place, and time. She has normal reflexes.  Skin: Skin is warm and dry.  Psychiatric: She has a normal mood and affect. Her behavior is normal. Judgment and thought content normal.  BP 129/63   Pulse 72   Temp 97.7 F (36.5 C) (Oral)   Ht _0  (1.651 m)   Wt 126 lb (57.2 kg)   BMI 20.97 kg/m         Assessment & Plan:  1. Peripheral vascular disease, unspecified (HCC) Elevate legs when sitting  2. Malignant neoplasm of lower lobe of left lung ([place]) Need to follow up with oncology  3. Gastroesophageal reflux disease, esophagitis presence not specified Avoid spicy foods Do not eat 2 hours prior to bedtime -  pantoprazole (PROTONIX) 40 MG tablet; Take 1 tablet (40 mg total) by mouth daily.  Dispense: 30 tablet; Refill: 0  4. IBS (irritable bowel syndrome) Watch diet  5. Generalized edema  6. Hyperlipidemia with target LDL less than 100 Lowfat diet - CMP14+EGFR - Lipid panel  7. Protein-calorie malnutrition, severe ([place]) Continue ensure daily  8. Urinary urgency - oxybutynin (DITROPAN) 5 MG tablet; TAKE (1) TABLET TWICE A DAY.  Dispense: 60 tablet; Refill: 0    Labs pending Health maintenance reviewed Diet and exercise encouraged Continue all meds Follow up  In 6 months   [place], FNP

## 2015-09-29 LAB — LIPID PANEL
CHOL/HDL RATIO: 3.5 ratio (ref 0.0–4.4)
CHOLESTEROL TOTAL: 218 mg/dL — AB (ref 100–199)
HDL: 63 mg/dL (ref >39)
LDL CALC: 134 mg/dL — AB (ref 0–99)
TRIGLYCERIDES: 105 mg/dL (ref 0–149)
VLDL Cholesterol Cal: 21 mg/dL (ref 5–40)

## 2015-09-29 LAB — CMP14+EGFR
ALK PHOS: 85 IU/L (ref 39–117)
ALT: 15 IU/L (ref 0–32)
AST: 21 IU/L (ref 0–40)
Albumin/Globulin Ratio: 1.3 (ref 1.2–2.2)
Albumin: 4.1 g/dL (ref 3.5–4.7)
BUN/Creatinine Ratio: 27 (ref 12–28)
BUN: 17 mg/dL (ref 8–27)
Bilirubin Total: 0.3 mg/dL (ref 0.0–1.2)
CALCIUM: 9.9 mg/dL (ref 8.7–10.3)
CO2: 25 mmol/L (ref 18–29)
CREATININE: 0.64 mg/dL (ref 0.57–1.00)
Chloride: 100 mmol/L (ref 96–106)
GFR calc Af Amer: 97 mL/min/{1.73_m2} (ref >59)
GFR, EST NON AFRICAN AMERICAN: 84 mL/min/{1.73_m2} (ref >59)
GLOBULIN, TOTAL: 3.1 g/dL (ref 1.5–4.5)
GLUCOSE: 92 mg/dL (ref 65–99)
Potassium: 5.4 mmol/L — ABNORMAL HIGH (ref 3.5–5.2)
SODIUM: 144 mmol/L (ref 134–144)
Total Protein: 7.2 g/dL (ref 6.0–8.5)

## 2015-09-29 LAB — ANEMIA PROFILE B
BASOS: 1 %
Basophils Absolute: 0.1 10*3/uL (ref 0.0–0.2)
EOS (ABSOLUTE): 0.4 10*3/uL (ref 0.0–0.4)
EOS: 4 %
FERRITIN: 34 ng/mL (ref 15–150)
Folate: 17 ng/mL (ref >3.0)
HEMATOCRIT: 37.3 % (ref 34.0–46.6)
HEMOGLOBIN: 11.8 g/dL (ref 11.1–15.9)
IMMATURE GRANS (ABS): 0 10*3/uL (ref 0.0–0.1)
IRON SATURATION: 7 % — AB (ref 15–55)
Immature Granulocytes: 0 %
Iron: 26 ug/dL — ABNORMAL LOW (ref 27–139)
LYMPHS: 24 %
Lymphocytes Absolute: 2.5 10*3/uL (ref 0.7–3.1)
MCH: 25.6 pg — ABNORMAL LOW (ref 26.6–33.0)
MCHC: 31.6 g/dL (ref 31.5–35.7)
MCV: 81 fL (ref 79–97)
MONOCYTES: 7 %
Monocytes Absolute: 0.7 10*3/uL (ref 0.1–0.9)
Neutrophils Absolute: 6.7 10*3/uL (ref 1.4–7.0)
Neutrophils: 64 %
Platelets: 398 10*3/uL — ABNORMAL HIGH (ref 150–379)
RBC: 4.61 x10E6/uL (ref 3.77–5.28)
RDW: 16.9 % — ABNORMAL HIGH (ref 12.3–15.4)
RETIC CT PCT: 1.4 % (ref 0.6–2.6)
TIBC: 380 ug/dL (ref 250–450)
UIBC: 354 ug/dL (ref 118–369)
Vitamin B-12: 1225 pg/mL — ABNORMAL HIGH (ref 211–946)
WBC: 10.3 10*3/uL (ref 3.4–10.8)

## 2015-11-09 ENCOUNTER — Telehealth: Payer: Self-pay

## 2015-11-09 NOTE — Telephone Encounter (Signed)
Pt. Called to inquire about wearing compression stocking for the swelling in her feet and legs.  Reported the swelling is equal in both legs, and goes from her toes to the mid-calf.  Reported it does improve somewhat overnight.   Denied leg pain.  Denied any nonhealing ulcers of lower extremities.  Last ABI's 09/17/15; showed moderate right LE (0.66), and mild left LE (0.84) Arterial occlusive disease.  Will make Dr. Oneida Alar aware, and call pt. back with recommendation.  Agreed.

## 2015-11-10 NOTE — Telephone Encounter (Signed)
RE: approval for compression stockings  Received: Yesterday  Message Contents  Elam Dutch, MD  Denman George, RN        yes   Previous Messages    ----- Message -----  From: Denman George, RN  Sent: 11/09/2015  1:05 PM  To: Elam Dutch, MD  Subject: approval for compression stockings        Is it okay that this pt. wear compression stockings of 15-20 mm compression?

## 2015-11-10 NOTE — Telephone Encounter (Signed)
Notified pt. of recommendation by Dr. Oneida Alar to wear compression hose with 15-20 mm compression.  Verb. Understanding.

## 2015-11-13 ENCOUNTER — Encounter: Payer: Self-pay | Admitting: Pulmonary Disease

## 2015-11-13 ENCOUNTER — Ambulatory Visit (INDEPENDENT_AMBULATORY_CARE_PROVIDER_SITE_OTHER): Payer: Medicare Other | Admitting: Pulmonary Disease

## 2015-11-13 ENCOUNTER — Ambulatory Visit: Payer: Medicare Other | Admitting: Pulmonary Disease

## 2015-11-13 VITALS — BP 110/68 | HR 73 | Ht 65.0 in | Wt 126.8 lb

## 2015-11-13 DIAGNOSIS — J432 Centrilobular emphysema: Secondary | ICD-10-CM

## 2015-11-13 DIAGNOSIS — Z23 Encounter for immunization: Secondary | ICD-10-CM | POA: Diagnosis not present

## 2015-11-13 DIAGNOSIS — I6521 Occlusion and stenosis of right carotid artery: Secondary | ICD-10-CM

## 2015-11-13 DIAGNOSIS — C3432 Malignant neoplasm of lower lobe, left bronchus or lung: Secondary | ICD-10-CM

## 2015-11-13 NOTE — Progress Notes (Signed)
   Subjective:    Patient ID: [name], female    DOB: 01/26/1932, 82 y.o.   MRN: [id number]  HPI  82 year old smoker for FU of mild COPD & pulmonary nodule s/p RT 07/2015 PMh/o COPD, remote PE, colonic adenoma and lymphocytic colitis,  2007 - H/o left empyema requiring thoracotomy, complicated by bleeding & PMV , tstomy     11/13/2015  Chief Complaint  Patient presents with  . Follow-up    Pt. states her breathing is doing ok, havn't needed her inhaler, coughing,some dizziness Pt. denies wheezing, chest pain    She completed radiation to left lower lobe nodule in 07/2015 She tolerated this well without significant adverse effects She reports chronic. Pedal edema-breathing is okay She only takes Symbicort as needed Has not needed rescue albuterol much   Significant tests/ events  CT chest 06/29/13: New extensive interstitial lung disease primarily in the upper lobes with progressive bronchiectasis at the right base and new extensive bronchiectasis at the left base. And New hilar and mediastinal adenopathy- compared to CT from 2010.  Sputum AFB neg  Workup showed -ACE 48, RA mild pos, ESR 30, ANA positive 1:40  PFTs  08/2013 - FVC 96%, TLC 95%, ratio 74, no obstruction, DLCO 43% -10.3    CT 10/03/13 -aggressive appearing 1.5 x 1.2 x 1.5 cm nodule with spiculated margins the causes retraction of the adjacent left major fissure CT chest 01/23/14 - unchanged spiculated left lower lobe (sup seg) nodule  PET  01/2014-hypermetabolic nodule, no LNs light up     Review of Systems neg for any significant sore throat, dysphagia, itching, sneezing, nasal congestion or excess/ purulent secretions, fever, chills, sweats, unintended wt loss, pleuritic or exertional cp, hempoptysis, orthopnea pnd or change in chronic leg swelling.   Also denies presyncope, palpitations, heartburn, abdominal pain, nausea, vomiting, diarrhea or change in bowel or urinary habits, dysuria,hematuria,  rash, arthralgias, visual complaints, headache, numbness weakness or ataxia.     Objective:   Physical Exam  Gen. Pleasant, well-nourished, in no distress ENT - no lesions, no post nasal drip Neck: No JVD, no thyromegaly, no carotid bruits Lungs: no use of accessory muscles, no dullness to percussion, decreased without rales or rhonchi  Cardiovascular: Rhythm regular, heart sounds  normal, no murmurs or gallops, no peripheral edema Musculoskeletal: No deformities, no cyanosis or clubbing        Assessment & Plan:

## 2015-11-13 NOTE — Patient Instructions (Addendum)
CT chest no contrast will be scheduled Prevnar vaccine today  You have to QUIT smoking !!!

## 2015-11-13 NOTE — Assessment & Plan Note (Signed)
CT chest no contrast will be scheduled -post RT Prevnar vaccine today

## 2015-11-13 NOTE — Assessment & Plan Note (Signed)
Smoking cessation most important intervention here and was again emphasized

## 2015-11-26 ENCOUNTER — Ambulatory Visit (INDEPENDENT_AMBULATORY_CARE_PROVIDER_SITE_OTHER)
Admission: RE | Admit: 2015-11-26 | Discharge: 2015-11-26 | Disposition: A | Discharge status: Home / Self Care | Payer: Medicare Other | Source: Ambulatory Visit | Attending: Pulmonary Disease | Admitting: Pulmonary Disease

## 2015-11-26 ENCOUNTER — Other Ambulatory Visit: Payer: Self-pay | Admitting: Nurse Practitioner

## 2015-11-26 DIAGNOSIS — K219 Gastro-esophageal reflux disease without esophagitis: Secondary | ICD-10-CM

## 2015-11-26 DIAGNOSIS — C3432 Malignant neoplasm of lower lobe, left bronchus or lung: Secondary | ICD-10-CM | POA: Diagnosis not present

## 2015-11-26 DIAGNOSIS — R3915 Urgency of urination: Secondary | ICD-10-CM

## 2015-11-26 DIAGNOSIS — C3412 Malignant neoplasm of upper lobe, left bronchus or lung: Secondary | ICD-10-CM | POA: Diagnosis not present

## 2015-11-26 NOTE — Telephone Encounter (Signed)
Last filled 10/23/15, last seen 09/28/15, route to pool for call in

## 2015-11-27 NOTE — Telephone Encounter (Signed)
Refill called in to [facility]

## 2015-11-27 NOTE — Telephone Encounter (Signed)
Please call in alprazolam with 1 refills 

## 2015-12-14 NOTE — Addendum Note (Signed)
Addended by: Lianne Cure A on: 12/14/2015 01:54 PM   Modules accepted: Orders

## 2015-12-15 ENCOUNTER — Telehealth: Payer: Self-pay | Admitting: *Deleted

## 2015-12-15 NOTE — Telephone Encounter (Signed)
CALLED PATIENT TO INFORM OF LAB ON 01-06-16 - @ 10:45 AM, AND HER CT ON 01-06-16 - ARRIVAL TIME - 11:45 AM @ [facility] PATIENT TO BE NPO 4 HRS. PRIOR TO TEST, SHE CAN HAVE CLEAR LIQUIDS ONLY, SPOKE WITH PATIENT AND SHE IS AWARE OF THESE APPTS.

## 2015-12-26 ENCOUNTER — Other Ambulatory Visit: Payer: Self-pay | Admitting: Nurse Practitioner

## 2015-12-26 DIAGNOSIS — R404 Transient alteration of awareness: Secondary | ICD-10-CM | POA: Diagnosis not present

## 2015-12-26 DIAGNOSIS — K219 Gastro-esophageal reflux disease without esophagitis: Secondary | ICD-10-CM

## 2015-12-26 DIAGNOSIS — R531 Weakness: Secondary | ICD-10-CM | POA: Diagnosis not present

## 2016-01-06 ENCOUNTER — Ambulatory Visit (HOSPITAL_COMMUNITY)
Admission: RE | Admit: 2016-01-06 | Discharge: 2016-01-06 | Disposition: A | Discharge status: Home / Self Care | Payer: Medicare Other | Source: Ambulatory Visit | Attending: Radiation Oncology | Admitting: Radiation Oncology

## 2016-01-06 ENCOUNTER — Other Ambulatory Visit: Payer: Self-pay | Admitting: *Deleted

## 2016-01-06 ENCOUNTER — Encounter (HOSPITAL_COMMUNITY): Payer: Self-pay

## 2016-01-06 ENCOUNTER — Ambulatory Visit
Admission: RE | Admit: 2016-01-06 | Discharge: 2016-01-06 | Disposition: A | Discharge status: Home / Self Care | Payer: Medicare Other | Source: Ambulatory Visit | Attending: Radiation Oncology | Admitting: Radiation Oncology

## 2016-01-06 DIAGNOSIS — C3432 Malignant neoplasm of lower lobe, left bronchus or lung: Secondary | ICD-10-CM | POA: Diagnosis not present

## 2016-01-06 DIAGNOSIS — C3400 Malignant neoplasm of unspecified main bronchus: Secondary | ICD-10-CM

## 2016-01-06 DIAGNOSIS — J439 Emphysema, unspecified: Secondary | ICD-10-CM | POA: Insufficient documentation

## 2016-01-06 LAB — BUN AND CREATININE (CC13)
BUN: 18.2 mg/dL (ref 7.0–26.0)
Creatinine: 0.8 mg/dL (ref 0.6–1.1)
EGFR: 69 mL/min/{1.73_m2} — ABNORMAL LOW (ref >90)

## 2016-01-06 MED ORDER — IOPAMIDOL (ISOVUE-300) INJECTION 61%
INTRAVENOUS | Status: AC
  Administered 2016-01-06: 75 mL via INTRAVENOUS
  Filled 2016-01-06: qty 75

## 2016-01-07 ENCOUNTER — Ambulatory Visit: Payer: Medicare Other | Admitting: Nurse Practitioner

## 2016-01-12 ENCOUNTER — Telehealth: Payer: Self-pay | Admitting: Radiation Oncology

## 2016-01-12 DIAGNOSIS — C3400 Malignant neoplasm of unspecified main bronchus: Secondary | ICD-10-CM

## 2016-01-12 DIAGNOSIS — C3432 Malignant neoplasm of lower lobe, left bronchus or lung: Secondary | ICD-10-CM | POA: Insufficient documentation

## 2016-01-12 NOTE — Addendum Note (Signed)
Addended by: Carola Rhine on: 01/12/2016 09:41 AM   Modules accepted: Orders

## 2016-01-12 NOTE — Telephone Encounter (Addendum)
LM for pt to call back to discuss results of  CT.  Pt called back and we reviewed CT results and recommendations for repeat in 6 months with follow up thereafter.

## 2016-01-26 ENCOUNTER — Other Ambulatory Visit: Payer: Self-pay | Admitting: Nurse Practitioner

## 2016-01-27 NOTE — Telephone Encounter (Signed)
Last filled 12/26/15, last seen 09/28/15. Route to pool for call in.

## 2016-01-29 ENCOUNTER — Other Ambulatory Visit: Payer: Self-pay | Admitting: Nurse Practitioner

## 2016-01-29 NOTE — Telephone Encounter (Signed)
Please call in alprazolam with 0 refills

## 2016-01-30 NOTE — Telephone Encounter (Signed)
Doing this from home, looks like it was approved, but never called in. Resend to pool

## 2016-02-01 NOTE — Telephone Encounter (Signed)
Please call in xanax with 1 refills 

## 2016-02-20 ENCOUNTER — Encounter: Payer: Self-pay | Admitting: Family Medicine

## 2016-02-20 ENCOUNTER — Ambulatory Visit (INDEPENDENT_AMBULATORY_CARE_PROVIDER_SITE_OTHER): Payer: Medicare Other | Admitting: Family Medicine

## 2016-02-20 VITALS — BP 160/77 | HR 70 | Temp 96.7°F | Ht 65.0 in | Wt 132.2 lb

## 2016-02-20 DIAGNOSIS — I87393 Chronic venous hypertension (idiopathic) with other complications of bilateral lower extremity: Secondary | ICD-10-CM | POA: Diagnosis not present

## 2016-02-20 DIAGNOSIS — I739 Peripheral vascular disease, unspecified: Secondary | ICD-10-CM

## 2016-02-20 DIAGNOSIS — L03119 Cellulitis of unspecified part of limb: Secondary | ICD-10-CM | POA: Diagnosis not present

## 2016-02-20 MED ORDER — CIPROFLOXACIN HCL 250 MG PO TABS: 250.0000 mg | ORAL_TABLET | Freq: Two times a day (BID) | ORAL | 0 refills | Status: DC

## 2016-02-20 NOTE — Progress Notes (Signed)
Subjective:  Patient ID: [name], female    DOB: 10-30-1932  Age: 83 y.o. MRN: [id number]  CC: Edema (Bilateral legs for 1 month)   HPI ZI SEK presents for Continued swelling of both legs. She has had stents placed in both legs by vascular surgery. She notes that recently the leg swelled up so much that they started forming bumps that itch. She scratches them. Then they pop open and they leak watery fluid. They have become very red and painful over the last few days. She has been told in the past to wear compression stockings. She has not been doing this . Chart review shows that Dr. Juanda Crumble fields is her vascular surgeon. Most recent encounter with him recorded is 09/17/2015. That note is reviewed. He states that she had external iliac stenting August 2014. He stated she had not been considered a candidate for surgical correction of her deficits due to recent diagnosis of lung cancer and underlying COPD.  History Jasmynn has a past medical history of Anxiety disorder; Cancer ([place]); Colon adenoma; COPD (chronic obstructive pulmonary disease) ([place]); Depression; Fall at home (May 17, 2012); GERD (gastroesophageal reflux disease); Hyperlipidemia; Irritable bowel syndrome; Lymphocytic colitis (12/15/2011); Peptic ulcer disease; Pneumonia (2007); Pseudotumor cerebri; Pulmonary embolism ([place]) (2004); and Radiation (4/13,4/15,04/30/14).   She has a past surgical history that includes Cholecystectomy; Colon resection; Abdominal hysterectomy; Brain surgery; Cataract extraction; Upper gastrointestinal endoscopy; Colonoscopy; Lower extremity stents; abdominal aortagram (N/A, 08/10/2012); and Insertion of iliac stent (Bilateral, 08/10/2012).   Her family history includes Cancer in her mother; Heart attack in her mother; Hyperlipidemia in her mother and sister; Hypertension in her mother; Lung cancer in her father.She reports that she has been smoking Cigarettes.  She has a 30.00 pack-year smoking history.  She has never used smokeless tobacco. She reports that she does not drink alcohol or use drugs.    ROS Review of Systems  Constitutional: Negative for activity change, appetite change and fever.  HENT: Negative for congestion, rhinorrhea and sore throat.   Eyes: Negative for visual disturbance.  Respiratory: Positive for shortness of breath. Negative for cough.   Cardiovascular: Positive for leg swelling. Negative for chest pain and palpitations.  Gastrointestinal: Negative for abdominal pain, diarrhea and nausea.  Genitourinary: Negative for dysuria.  Musculoskeletal: Positive for myalgias.    Objective:  BP (!) 160/77   Pulse 70   Temp (!) 96.7 F (35.9 C) (Oral)   Ht '5\' 5"'$  (1.651 m)   Wt 132 lb 3.2 oz (60 kg)   BMI 22.00 kg/m   BP Readings from Last 3 Encounters:  02/20/16 (!) 160/77  11/13/15 110/68  09/28/15 129/63    Wt Readings from Last 3 Encounters:  02/20/16 132 lb 3.2 oz (60 kg)  11/13/15 126 lb 12.8 oz (57.5 kg)  09/28/15 126 lb (57.2 kg)     Physical Exam  Constitutional: She is oriented to person, place, and time. She appears well-developed and well-nourished. No distress.  HENT:  Head: Normocephalic and atraumatic.  Eyes: Conjunctivae are normal. Pupils are equal, round, and reactive to light.  Neck: Normal range of motion. Neck supple. No thyromegaly present.  Cardiovascular: Normal rate, regular rhythm and normal heart sounds.   No murmur heard. Pulmonary/Chest: Effort normal and breath sounds normal. No respiratory distress. She has no wheezes. She has no rales.  Abdominal: Soft. Bowel sounds are normal. She exhibits no distension. There is no tenderness.  Musculoskeletal: Normal range of motion. She exhibits edema (4+  with multiple almost cobblestone appearing vesicular eruption on the posterior leg bilaterally. There is diffuse erythema extending from the ankle to the tibial tuberosity bilaterally anteriorly and posteriorly. There is mild  tenderness throughout. Ther).  Lymphadenopathy:    She has no cervical adenopathy.  Neurological: She is alert and oriented to person, place, and time.  Skin: Skin is warm and dry.  Psychiatric: She has a normal mood and affect. Her behavior is normal. Judgment and thought content normal.    Ct Chest W Contrast  Result Date: 01/06/2016 CLINICAL DATA:  Followup left lower lobe lung carcinoma. Status post radiation therapy. Chronic shortness of breath. Emphysema. EXAM: CT CHEST WITH CONTRAST TECHNIQUE: Multidetector CT imaging of the chest was performed during intravenous contrast administration. CONTRAST:  75 mL ISOVUE-300 IOPAMIDOL (ISOVUE-300) INJECTION 61% COMPARISON:  11/26/2015 FINDINGS: Cardiovascular: No acute findings. Aortic and coronary artery atherosclerosis. Mediastinum/Nodes: No masses or pathologically enlarged lymph nodes identified. Sub-cm mediastinal lymph nodes show no significant change. Lungs/Pleura: Moderate emphysema again demonstrated. Scarring in the left perihilar region and left lung base show no significant change. No suspicious pulmonary nodules or masses identified. No evidence of acute infiltrate or pleural effusion. Upper Abdomen: Normal adrenal glands. Left hepatic lobe cyst and probable small flash filling hemangioma again noted. Musculoskeletal:  No suspicious bone lesions. IMPRESSION: Stable post treatment changes in left lung. No evidence of recurrent or metastatic carcinoma within the thorax. Moderate emphysema.  No acute findings. Electronically Signed   By: Earle Gell M.D.   On: 01/06/2016 16:33    Assessment & Plan:   Antonique was seen today for edema.  Diagnoses and all orders for this visit:  Peripheral vascular disease ([place]) -     Ambulatory referral to Vascular Surgery  Stasis edema with complication, bilateral -     Ambulatory referral to Vascular Surgery  Cellulitis of lower extremity, unspecified laterality -     Ambulatory referral to Vascular  Surgery  Other orders -     ciprofloxacin (CIPRO) 250 MG tablet; Take 1 tablet (250 mg total) by mouth 2 (two) times daily.   Due to her age the Cipro dose was cut in half to handle the cellulitis. For the stasis edema she will need to keep her legs elevated. She should also obtain compression stockings. These should probably be 30-40 mmHg. She should follow up with Dr. Oneida Alar.   I have discontinued Ms. Maiello's Guaifenesin. I am also having her start on ciprofloxacin. Additionally, I am having her maintain her aspirin EC, CRANBERRY PO, Vitamin D, vitamin E, colestipol, budesonide-formoterol, albuterol, oxybutynin, pantoprazole, and ALPRAZolam.  Allergies as of 02/20/2016      Reactions   Codeine Anaphylaxis   Penicillins Anaphylaxis   Sulfa Antibiotics Anaphylaxis      Medication List       Accurate as of 02/20/16  2:27 PM. Always use your most recent med list.          albuterol 108 (90 Base) MCG/ACT inhaler Commonly known as:  PROVENTIL HFA;VENTOLIN HFA Inhale 2 puffs into the lungs every 4 (four) hours as needed for wheezing or shortness of breath.   ALPRAZolam 0.5 MG tablet Commonly known as:  XANAX TAKE  (1)  TABLET TWICE A DAY.   aspirin EC 325 MG tablet Take 325 mg by mouth daily.   budesonide-formoterol 160-4.5 MCG/ACT inhaler Commonly known as:  SYMBICORT Inhale 2 puffs into the lungs 2 (two) times daily.   ciprofloxacin 250 MG tablet Commonly known as:  CIPRO  Take 1 tablet (250 mg total) by mouth 2 (two) times daily.   colestipol 1 g tablet Commonly known as:  COLESTID TAKE (2) TABLETS TWICE DAILY.   CRANBERRY PO Take 1 tablet by mouth daily.   oxybutynin 5 MG tablet Commonly known as:  DITROPAN TAKE (1) TABLET TWICE A DAY.   pantoprazole 40 MG tablet Commonly known as:  PROTONIX TAKE 1 TABLET DAILY   Vitamin D 2000 units tablet Take 2,000 Units by mouth daily.   vitamin E 400 UNIT capsule Take 400 Units by mouth daily.        Follow-up:  Return in about 2 weeks (around 03/05/2016).  Claretta Fraise, M.D.

## 2016-02-25 ENCOUNTER — Other Ambulatory Visit: Payer: Self-pay | Admitting: Nurse Practitioner

## 2016-02-26 ENCOUNTER — Encounter: Payer: Self-pay | Admitting: Family Medicine

## 2016-02-26 ENCOUNTER — Ambulatory Visit (INDEPENDENT_AMBULATORY_CARE_PROVIDER_SITE_OTHER): Payer: Medicare Other | Admitting: Family Medicine

## 2016-02-26 VITALS — BP 148/71 | HR 70 | Temp 98.6°F | Ht 65.0 in | Wt 137.0 lb

## 2016-02-26 DIAGNOSIS — I87393 Chronic venous hypertension (idiopathic) with other complications of bilateral lower extremity: Secondary | ICD-10-CM | POA: Diagnosis not present

## 2016-02-26 DIAGNOSIS — I739 Peripheral vascular disease, unspecified: Secondary | ICD-10-CM | POA: Diagnosis not present

## 2016-02-26 DIAGNOSIS — L03119 Cellulitis of unspecified part of limb: Secondary | ICD-10-CM | POA: Diagnosis not present

## 2016-02-26 MED ORDER — CIPROFLOXACIN HCL 250 MG PO TABS: 250.0000 mg | ORAL_TABLET | Freq: Two times a day (BID) | ORAL | 0 refills | Status: DC

## 2016-02-26 MED ORDER — POTASSIUM CHLORIDE CRYS ER 10 MEQ PO TBCR: 10.0000 meq | EXTENDED_RELEASE_TABLET | Freq: Every day | ORAL | 2 refills | Status: DC

## 2016-02-26 MED ORDER — FUROSEMIDE 40 MG PO TABS: 40.0000 mg | ORAL_TABLET | Freq: Every day | ORAL | 3 refills | Status: DC

## 2016-02-26 NOTE — Telephone Encounter (Signed)
Needs to be seen, hasnt been seen by PCP in 6 mo, has not had anxiety meds addressed in office visit in longer than that

## 2016-02-26 NOTE — Progress Notes (Signed)
Subjective:  Patient ID: [name], female    DOB: 1932/08/19  Age: 83 y.o. MRN: [id number]  CC: Follow-up (pt here today for 1 week recheck on her legs for PVD)   HPI [name] presents for Continued swelling of both legs. She has had stents placed in both legs by vascular surgery. She notes that recently the legs have not had any new bumps. Still very swollen She scratches them much less after discussion last week..  She has been told in the past to wear compression stockings. She has not been doing this this week, but is forming a plan to et some. Keeping legs elevated .Pt. had bilateral external iliac stenting August 2014. He stated she had not been considered a candidate for surgical correction of her deficits due to recent diagnosis of lung cancer and underlying COPD.  History [name] has a past medical history of Anxiety disorder; Cancer ([place]); Colon adenoma; COPD (chronic obstructive pulmonary disease) ([place]); Depression; Fall at home (May 17, 2012); GERD (gastroesophageal reflux disease); Hyperlipidemia; Irritable bowel syndrome; Lymphocytic colitis (12/15/2011); Peptic ulcer disease; Pneumonia (2007); Pseudotumor cerebri; Pulmonary embolism ([place]) (2004); and Radiation (4/13,4/15,04/30/14).   She has a past surgical history that includes Cholecystectomy; Colon resection; Abdominal hysterectomy; Brain surgery; Cataract extraction; Upper gastrointestinal endoscopy; Colonoscopy; Lower extremity stents; abdominal aortagram (N/A, 08/10/2012); and Insertion of iliac stent (Bilateral, 08/10/2012).   Her family history includes Cancer in her mother; Heart attack in her mother; Hyperlipidemia in her mother and sister; Hypertension in her mother; Lung cancer in her father.She reports that she has been smoking Cigarettes.  She has a 30.00 pack-year smoking history. She has never used smokeless tobacco. She reports that she does not drink alcohol or use drugs.    ROS Review of Systems    Constitutional: Negative for activity change, appetite change and fever.  HENT: Negative for congestion, rhinorrhea and sore throat.   Eyes: Negative for visual disturbance.  Respiratory: Positive for shortness of breath. Negative for cough.   Cardiovascular: Positive for leg swelling. Negative for chest pain and palpitations.  Gastrointestinal: Negative for abdominal pain, diarrhea and nausea.  Genitourinary: Negative for dysuria.  Musculoskeletal: Positive for myalgias.    Objective:  BP (!) 148/71   Pulse 70   Temp 98.6 F (37 C) (Oral)   Ht '5\' 5"'$  (1.651 m)   Wt 137 lb (62.1 kg)   BMI 22.80 kg/m   BP Readings from Last 3 Encounters:  02/26/16 (!) 148/71  02/20/16 (!) 160/77  11/13/15 110/68    Wt Readings from Last 3 Encounters:  02/26/16 137 lb (62.1 kg)  02/20/16 132 lb 3.2 oz (60 kg)  11/13/15 126 lb 12.8 oz (57.5 kg)     Physical Exam  Constitutional: She is oriented to person, place, and time. She appears well-developed and well-nourished. No distress.  HENT:  Head: Normocephalic and atraumatic.  Eyes: Conjunctivae are normal. Pupils are equal, round, and reactive to light.  Neck: Normal range of motion. Neck supple. No thyromegaly present.  Cardiovascular: Normal rate, regular rhythm and normal heart sounds.   No murmur heard. Pulmonary/Chest: Effort normal and breath sounds normal. No respiratory distress. She has no wheezes. She has no rales.  Abdominal: Soft. Bowel sounds are normal. She exhibits no distension. There is no tenderness.  Musculoskeletal: Normal range of motion. She exhibits edema (4+ with multiple almost cobblestone appearing vesicular eruption on the posterior leg bilaterally. There is diffuse erythema extending from the ankle to the tibial  tuberosity bilaterally anteriorly and posteriorly. There is mild tenderness throughout. Ther).  Lymphadenopathy:    She has no cervical adenopathy.  Neurological: She is alert and oriented to person,  place, and time.  Skin: Skin is warm and dry.  Psychiatric: She has a normal mood and affect. Her behavior is normal. Judgment and thought content normal.    Ct Chest W Contrast  Result Date: 01/06/2016 CLINICAL DATA:  Followup left lower lobe lung carcinoma. Status post radiation therapy. Chronic shortness of breath. Emphysema. EXAM: CT CHEST WITH CONTRAST TECHNIQUE: Multidetector CT imaging of the chest was performed during intravenous contrast administration. CONTRAST:  75 mL ISOVUE-300 IOPAMIDOL (ISOVUE-300) INJECTION 61% COMPARISON:  11/26/2015 FINDINGS: Cardiovascular: No acute findings. Aortic and coronary artery atherosclerosis. Mediastinum/Nodes: No masses or pathologically enlarged lymph nodes identified. Sub-cm mediastinal lymph nodes show no significant change. Lungs/Pleura: Moderate emphysema again demonstrated. Scarring in the left perihilar region and left lung base show no significant change. No suspicious pulmonary nodules or masses identified. No evidence of acute infiltrate or pleural effusion. Upper Abdomen: Normal adrenal glands. Left hepatic lobe cyst and probable small flash filling hemangioma again noted. Musculoskeletal:  No suspicious bone lesions. IMPRESSION: Stable post treatment changes in left lung. No evidence of recurrent or metastatic carcinoma within the thorax. Moderate emphysema.  No acute findings. Electronically Signed   By: Earle Gell M.D.   On: 01/06/2016 16:33    Assessment & Plan:   [name] was seen today for follow-up.  Diagnoses and all orders for this visit:  Stasis edema with complication, bilateral  Cellulitis of lower extremity, unspecified laterality  Peripheral vascular disease ([place])  Other orders -     ciprofloxacin (CIPRO) 250 MG tablet; Take 1 tablet (250 mg total) by mouth 2 (two) times daily. -     potassium chloride SA (K-DUR,KLOR-CON) 10 MEQ tablet; Take 1 tablet (10 mEq total) by mouth daily. As a potassium supplement -      furosemide (LASIX) 40 MG tablet; Take 1 tablet (40 mg total) by mouth daily.   Due to her age the Cipro dose was cut in half to handle the cellulitis. For the stasis edema she will need to keep her legs elevated. She should also obtain compression stockings. These should probably be 30-40 mmHg. She should follow up with Dr. Oneida Alar.   I have discontinued Ms. Diaz's colestipol. I am also having her start on potassium chloride and furosemide. Additionally, I am having her maintain her aspirin EC, CRANBERRY PO, Vitamin D, vitamin E, budesonide-formoterol, albuterol, oxybutynin, pantoprazole, ALPRAZolam, and ciprofloxacin.  Allergies as of 02/26/2016      Reactions   Codeine Anaphylaxis   Penicillins Anaphylaxis   Sulfa Antibiotics Anaphylaxis      Medication List       Accurate as of 02/26/16 11:59 PM. Always use your most recent med list.          albuterol 108 (90 Base) MCG/ACT inhaler Commonly known as:  PROVENTIL HFA;VENTOLIN HFA Inhale 2 puffs into the lungs every 4 (four) hours as needed for wheezing or shortness of breath.   ALPRAZolam 0.5 MG tablet Commonly known as:  XANAX TAKE  (1)  TABLET TWICE A DAY.   aspirin EC 325 MG tablet Take 325 mg by mouth daily.   budesonide-formoterol 160-4.5 MCG/ACT inhaler Commonly known as:  SYMBICORT Inhale 2 puffs into the lungs 2 (two) times daily.   ciprofloxacin 250 MG tablet Commonly known as:  CIPRO Take 1 tablet (250 mg total) by  mouth 2 (two) times daily.   CRANBERRY PO Take 1 tablet by mouth daily.   furosemide 40 MG tablet Commonly known as:  LASIX Take 1 tablet (40 mg total) by mouth daily.   oxybutynin 5 MG tablet Commonly known as:  DITROPAN TAKE (1) TABLET TWICE A DAY.   pantoprazole 40 MG tablet Commonly known as:  PROTONIX TAKE 1 TABLET DAILY   potassium chloride 10 MEQ tablet Commonly known as:  K-DUR,KLOR-CON Take 1 tablet (10 mEq total) by mouth daily. As a potassium supplement   Vitamin D 2000 units  tablet Take 2,000 Units by mouth daily.   vitamin E 400 UNIT capsule Take 400 Units by mouth daily.        Follow-up: Return in about 6 days (around 03/03/2016).  Claretta Fraise, M.D.

## 2016-03-03 ENCOUNTER — Ambulatory Visit (INDEPENDENT_AMBULATORY_CARE_PROVIDER_SITE_OTHER): Payer: Medicare Other | Admitting: Family Medicine

## 2016-03-03 ENCOUNTER — Telehealth: Payer: Self-pay

## 2016-03-03 VITALS — BP 142/78 | HR 82 | Temp 97.9°F | Ht 65.0 in | Wt 129.0 lb

## 2016-03-03 DIAGNOSIS — L03119 Cellulitis of unspecified part of limb: Secondary | ICD-10-CM | POA: Diagnosis not present

## 2016-03-03 DIAGNOSIS — I878 Other specified disorders of veins: Secondary | ICD-10-CM

## 2016-03-03 MED ORDER — CIPROFLOXACIN HCL 250 MG PO TABS: 250.0000 mg | ORAL_TABLET | Freq: Two times a day (BID) | ORAL | 0 refills | Status: DC

## 2016-03-03 NOTE — Progress Notes (Signed)
Subjective:  Patient ID: [name], female    DOB: [birth year]  Age: 83 y.o. MRN: [id number]  CC: Follow-up (pt here today following up on her legs, pt and daughter states they are better)   HPI [name] presents for The swelling has recited significantly. The patient could not tolerate the full dose of Lasix because she was urinating constantly. She decided to take just one half. She also decrease the potassium pill to one half daily. She has not been short of breath. She does not have the strength to raise her reclining chair at home to the fully reclined position therefore she does not elevate her feet as much as she liked. However family members have put a stool with pillows on it in front of her chair in order to raise her feet up better.   History [name] has a past medical history of Anxiety disorder; Cancer ([place]); Colon adenoma; COPD (chronic obstructive pulmonary disease) ([place]); Depression; Fall at home (May 17, 2012); GERD (gastroesophageal reflux disease); Hyperlipidemia; Irritable bowel syndrome; Lymphocytic colitis (12/15/2011); Peptic ulcer disease; Pneumonia (2007); Pseudotumor cerebri; Pulmonary embolism ([place]) (2004); and Radiation (4/13,4/15,04/30/14).   She has a past surgical history that includes Cholecystectomy; Colon resection; Abdominal hysterectomy; Brain surgery; Cataract extraction; Upper gastrointestinal endoscopy; Colonoscopy; Lower extremity stents; abdominal aortagram (N/A, 08/10/2012); and Insertion of iliac stent (Bilateral, 08/10/2012).   Her family history includes Cancer in her mother; Heart attack in her mother; Hyperlipidemia in her mother and sister; Hypertension in her mother; Lung cancer in her father.She reports that she has been smoking Cigarettes.  She has a 30.00 pack-year smoking history. She has never used smokeless tobacco. She reports that she does not drink alcohol or use drugs.    ROS Review of Systems   Unremarkable  Objective:  BP (!)  142/78   Pulse 82   Temp 97.9 F (36.6 C) (Oral)   Ht '5\' 5"'  (1.651 m)   Wt 129 lb (58.5 kg)   BMI 21.47 kg/m   BP Readings from Last 3 Encounters:  03/03/16 (!) 142/78  02/26/16 (!) 148/71  02/20/16 (!) 160/77    Wt Readings from Last 3 Encounters:  03/03/16 129 lb (58.5 kg)  02/26/16 137 lb (62.1 kg)  02/20/16 132 lb 3.2 oz (60 kg)     Physical Exam  Constitutional: She is oriented to person, place, and time.  Cardiovascular: Normal rate and regular rhythm.   Pulmonary/Chest: Breath sounds normal.  Neurological: She is alert and oriented to person, place, and time.  Skin: Skin is warm and dry. Rash noted.  The edema has receded dramatically. However it is still at the 2+ level to the tibial plateau. There is some pruning of the skin based on the rapid deflation of the legs. The erythema has diminished as well    Ct Chest W Contrast  Result Date: 01/06/2016 CLINICAL DATA:  Followup left lower lobe lung carcinoma. Status post radiation therapy. Chronic shortness of breath. Emphysema. EXAM: CT CHEST WITH CONTRAST TECHNIQUE: Multidetector CT imaging of the chest was performed during intravenous contrast administration. CONTRAST:  75 mL ISOVUE-300 IOPAMIDOL (ISOVUE-300) INJECTION 61% COMPARISON:  11/26/2015 FINDINGS: Cardiovascular: No acute findings. Aortic and coronary artery atherosclerosis. Mediastinum/Nodes: No masses or pathologically enlarged lymph nodes identified. Sub-cm mediastinal lymph nodes show no significant change. Lungs/Pleura: Moderate emphysema again demonstrated. Scarring in the left perihilar region and left lung base show no significant change. No suspicious pulmonary nodules or masses identified. No evidence of acute infiltrate  or pleural effusion. Upper Abdomen: Normal adrenal glands. Left hepatic lobe cyst and probable small flash filling hemangioma again noted. Musculoskeletal:  No suspicious bone lesions. IMPRESSION: Stable post treatment changes in left lung.  No evidence of recurrent or metastatic carcinoma within the thorax. Moderate emphysema.  No acute findings. Electronically Signed   By: Earle Gell M.D.   On: 01/06/2016 16:33    Assessment & Plan:   [name] was seen today for follow-up.  Diagnoses and all orders for this visit:  Cellulitis of lower extremity, unspecified laterality  Venous stasis of both lower extremities -     BMP8+EGFR -     Ambulatory referral to Vascular Surgery  Other orders -     ciprofloxacin (CIPRO) 250 MG tablet; Take 1 tablet (250 mg total) by mouth 2 (two) times daily.      I am having [name] maintain her aspirin EC, CRANBERRY PO, Vitamin D, vitamin E, budesonide-formoterol, albuterol, oxybutynin, pantoprazole, ALPRAZolam, potassium chloride, furosemide, and ciprofloxacin.  Allergies as of 03/03/2016      Reactions   Codeine Anaphylaxis   Penicillins Anaphylaxis   Sulfa Antibiotics Anaphylaxis      Medication List       Accurate as of 03/03/16  6:14 PM. Always use your most recent med list.          albuterol 108 (90 Base) MCG/ACT inhaler Commonly known as:  PROVENTIL HFA;VENTOLIN HFA Inhale 2 puffs into the lungs every 4 (four) hours as needed for wheezing or shortness of breath.   ALPRAZolam 0.5 MG tablet Commonly known as:  XANAX TAKE  (1)  TABLET TWICE A DAY.   aspirin EC 325 MG tablet Take 325 mg by mouth daily.   budesonide-formoterol 160-4.5 MCG/ACT inhaler Commonly known as:  SYMBICORT Inhale 2 puffs into the lungs 2 (two) times daily.   ciprofloxacin 250 MG tablet Commonly known as:  CIPRO Take 1 tablet (250 mg total) by mouth 2 (two) times daily.   CRANBERRY PO Take 1 tablet by mouth daily.   furosemide 40 MG tablet Commonly known as:  LASIX Take 1 tablet (40 mg total) by mouth daily.   oxybutynin 5 MG tablet Commonly known as:  DITROPAN TAKE (1) TABLET TWICE A DAY.   pantoprazole 40 MG tablet Commonly known as:  PROTONIX TAKE 1 TABLET DAILY   potassium  chloride 10 MEQ tablet Commonly known as:  K-DUR,KLOR-CON Take 1 tablet (10 mEq total) by mouth daily. As a potassium supplement   Vitamin D 2000 units tablet Take 2,000 Units by mouth daily.   vitamin E 400 UNIT capsule Take 400 Units by mouth daily.        Follow-up: Return in about 2 weeks (around 03/17/2016).  Claretta Fraise, M.D.

## 2016-03-03 NOTE — Telephone Encounter (Signed)
-----   Message from Margy Clarks, LPN sent at 0/35/0093  4:26 PM EST ----- This pts daughter called and said that her PCP has been treating her for 2 weeks for cellulitis and that they want her to come back and see [place], but no referral was made.   She last saw Dr. Oneida Alar on 09/17/15.  Has an apt to return on 09/22/16 with a carotid and ABI and f/u w/ [facility].  The daughter's name is [name] [phone].  Let me know how I should proceed.

## 2016-03-03 NOTE — Telephone Encounter (Signed)
attempted to return call to pt's daughter.  Left message to return call to office.

## 2016-03-04 LAB — BMP8+EGFR
BUN / CREAT RATIO: 22 (ref 12–28)
BUN: 17 mg/dL (ref 8–27)
CALCIUM: 9.4 mg/dL (ref 8.7–10.3)
CHLORIDE: 99 mmol/L (ref 96–106)
CO2: 28 mmol/L (ref 18–29)
CREATININE: 0.79 mg/dL (ref 0.57–1.00)
GFR calc non Af Amer: 70 (ref >59)
GFR, EST AFRICAN AMERICAN: 81 (ref >59)
GLUCOSE: 93 mg/dL (ref 65–99)
Potassium: 5 mmol/L (ref 3.5–5.2)
Sodium: 142 mmol/L (ref 134–144)

## 2016-03-04 NOTE — Telephone Encounter (Signed)
Attempted to return call to pt's daughter on cell # [phone], and home # [phone].  Left message to call office on cell #.  No option to leave message on the home number.

## 2016-03-14 NOTE — Progress Notes (Signed)
   Subjective:    Patient ID: [name], female    DOB: 01/21/1932, 83 y.o.   MRN: [id number]  HPI 83 year old female with presumed cellulitis of both lower extremities. She was started on Cipro last week for cellulitis. According to daughter or granddaughter who accompanies her today, legs look much better. Swelling has diminished in spite of the fact that she only takes half a diuretic that was prescribed.  Patient Active Problem List   Diagnosis Date Noted  . Venous stasis of both lower extremities 03/03/2016  . Primary cancer of left lower lobe of lung ([place]) 01/12/2016  . Malignant neoplasm of lower lobe of left lung ([place]) 01/12/2016  . Lung cancer ([place]) 11/14/2014  . Hyperlipidemia with target LDL less than 100 11/14/2014  . Centrilobular emphysema ([place]) 10/08/2013  . Protein-calorie malnutrition, severe ([place]) 06/21/2013  . Peripheral vascular disease ([place]) 05/17/2012  . Pain in limb 05/17/2012  . Edema 02/16/2012  . IBS (irritable bowel syndrome) 02/09/2012  . Lymphocytic colitis 12/15/2011  . Personal history of adenomatous colonic polyps 12/02/2011  . GERD 10/20/2008   Outpatient Encounter Prescriptions as of 03/15/2016  Medication Sig  . albuterol (PROVENTIL HFA;VENTOLIN HFA) 108 (90 Base) MCG/ACT inhaler Inhale 2 puffs into the lungs every 4 (four) hours as needed for wheezing or shortness of breath.  . ALPRAZolam (XANAX) 0.5 MG tablet TAKE  (1)  TABLET TWICE A DAY.  Marland Kitchen aspirin EC 325 MG tablet Take 325 mg by mouth daily.  . budesonide-formoterol (SYMBICORT) 160-4.5 MCG/ACT inhaler Inhale 2 puffs into the lungs 2 (two) times daily.  . Cholecalciferol (VITAMIN D) 2000 UNITS tablet Take 2,000 Units by mouth daily.  . ciprofloxacin (CIPRO) 250 MG tablet Take 1 tablet (250 mg total) by mouth 2 (two) times daily.  Marland Kitchen CRANBERRY PO Take 1 tablet by mouth daily.  . furosemide (LASIX) 40 MG tablet Take 1 tablet (40 mg total) by mouth daily.  Marland Kitchen oxybutynin (DITROPAN) 5 MG tablet TAKE  (1) TABLET TWICE A DAY.  . pantoprazole (PROTONIX) 40 MG tablet TAKE 1 TABLET DAILY  . potassium chloride SA (K-DUR,KLOR-CON) 10 MEQ tablet Take 1 tablet (10 mEq total) by mouth daily. As a potassium supplement  . vitamin E 400 UNIT capsule Take 400 Units by mouth daily.   No facility-administered encounter medications on file as of 03/15/2016.       Review of Systems  Constitutional: Negative.   Respiratory: Negative.   Cardiovascular: Negative.   Skin: Positive for color change and rash.       Objective:   Physical Exam  Constitutional: She appears well-developed and well-nourished.  Musculoskeletal: Edema: there is 1+ edema both lower extremities. There are some excoriated areas probably related to patient scratching and itching; there is some erythema.   BP 116/63   Pulse 74   Temp 97.4 F (36.3 C) (Oral)   Ht '5\' 5"'$  (1.651 m)   Wt 130 lb 3.2 oz (59.1 kg)   BMI 21.67 kg/m         Assessment & Plan:  1. Venous stasis of both lower extremities Since there has been improvement on Cipro, will continue for 1 more week. Cipro would not be my first choice of antibiotic but she is allergic to penicillin and sulfa. Will add triamcinolone cream to be mixed with Eucerin for itching and inflammation. Continue with low-dose diuretic and elevation  Wardell Honour MD

## 2016-03-15 ENCOUNTER — Encounter: Payer: Self-pay | Admitting: Family Medicine

## 2016-03-15 ENCOUNTER — Ambulatory Visit (INDEPENDENT_AMBULATORY_CARE_PROVIDER_SITE_OTHER): Payer: Medicare Other | Admitting: Family Medicine

## 2016-03-15 VITALS — BP 116/63 | HR 74 | Temp 97.4°F | Ht 65.0 in | Wt 130.2 lb

## 2016-03-15 DIAGNOSIS — I878 Other specified disorders of veins: Secondary | ICD-10-CM

## 2016-03-15 MED ORDER — TRIAMCINOLONE 0.1 % CREAM:EUCERIN CREAM 1:1: 1.0000 "application " | TOPICAL_CREAM | Freq: Three times a day (TID) | CUTANEOUS | 0 refills | Status: DC

## 2016-03-15 MED ORDER — CIPROFLOXACIN HCL 250 MG PO TABS: 250.0000 mg | ORAL_TABLET | Freq: Two times a day (BID) | ORAL | 0 refills | Status: DC

## 2016-03-18 ENCOUNTER — Ambulatory Visit (INDEPENDENT_AMBULATORY_CARE_PROVIDER_SITE_OTHER): Payer: Medicare Other | Admitting: Family Medicine

## 2016-03-18 ENCOUNTER — Encounter: Payer: Self-pay | Admitting: Family Medicine

## 2016-03-18 VITALS — BP 126/53 | HR 59 | Temp 97.0°F | Ht 65.0 in | Wt 133.0 lb

## 2016-03-18 DIAGNOSIS — I878 Other specified disorders of veins: Secondary | ICD-10-CM

## 2016-03-18 NOTE — Progress Notes (Signed)
Subjective:  Patient ID: [name], female    DOB: 09-13-1932  Age: 83 y.o. MRN: [id number]  CC: Venous Stasis Ulcer (pt here today following up on her lower legs, they are better and she is still on the Cipro)   HPI [name] presents for The swelling has gone down significantly. The patient could not tolerate the full dose of Lasix because she was urinating constantly. She decided to take just one half. She also decrease the potassium pill to one half daily. She has not been short of breath. She does not have the strength to raise her reclining chair at home to the fully reclined position therefore she does not elevate her feet as much as she liked. However family members have put a stool with pillows on it in front of her chair in order to raise her feet up better.   History [name] has a past medical history of Anxiety disorder; Cancer ([place]); Colon adenoma; COPD (chronic obstructive pulmonary disease) ([place]); Depression; Fall at home (May 17, 2012); GERD (gastroesophageal reflux disease); Hyperlipidemia; Irritable bowel syndrome; Lymphocytic colitis (12/15/2011); Peptic ulcer disease; Pneumonia (2007); Pseudotumor cerebri; Pulmonary embolism ([place]) (2004); and Radiation (4/13,4/15,04/30/14).   She has a past surgical history that includes Cholecystectomy; Colon resection; Abdominal hysterectomy; Brain surgery; Cataract extraction; Upper gastrointestinal endoscopy; Colonoscopy; Lower extremity stents; abdominal aortagram (N/A, 08/10/2012); and Insertion of iliac stent (Bilateral, 08/10/2012).   Her family history includes Cancer in her mother; Heart attack in her mother; Hyperlipidemia in her mother and sister; Hypertension in her mother; Lung cancer in her father.She reports that she has been smoking Cigarettes.  She has a 30.00 pack-year smoking history. She has never used smokeless tobacco. She reports that she does not drink alcohol or use drugs.    ROS Review of Systems    Constitutional: Negative for activity change, appetite change and fever.  HENT: Negative for congestion, rhinorrhea and sore throat.   Eyes: Negative for visual disturbance.  Respiratory: Negative for cough and shortness of breath.   Cardiovascular: Negative for chest pain and palpitations.  Gastrointestinal: Negative for abdominal pain, diarrhea and nausea.  Genitourinary: Negative for dysuria.  Musculoskeletal: Negative for arthralgias and myalgias.     Unremarkable  Objective:  BP (!) 126/53   Pulse (!) 59   Temp 97 F (36.1 C) (Oral)   Ht '5\' 5"'$  (1.651 m)   Wt 133 lb (60.3 kg)   BMI 22.13 kg/m   BP Readings from Last 3 Encounters:  03/18/16 (!) 126/53  03/15/16 116/63  03/03/16 (!) 142/78    Wt Readings from Last 3 Encounters:  03/18/16 133 lb (60.3 kg)  03/15/16 130 lb 3.2 oz (59.1 kg)  03/03/16 129 lb (58.5 kg)     Physical Exam  Constitutional: She is oriented to person, place, and time.  Cardiovascular: Normal rate and regular rhythm.   Pulmonary/Chest: Breath sounds normal.  Neurological: She is alert and oriented to person, place, and time.  Skin: Skin is warm and dry. Rash noted.  The edema has receded dramatically. It is trace only. The erythema has resolved as well    Ct Chest W Contrast  Result Date: 01/06/2016 CLINICAL DATA:  Followup left lower lobe lung carcinoma. Status post radiation therapy. Chronic shortness of breath. Emphysema. EXAM: CT CHEST WITH CONTRAST TECHNIQUE:   IMPRESSION: Stable post treatment changes in left lung. No evidence of recurrent or metastatic carcinoma within the thorax. Moderate emphysema.  No acute findings. Electronically Signed  By: Earle Gell M.D.   On: 01/06/2016 16:33    Assessment & Plan:   [name] was seen today for venous stasis ulcer.  Diagnoses and all orders for this visit:  Venous stasis of both lower extremities      I am having [name] maintain her aspirin EC, CRANBERRY PO, Vitamin D, vitamin  E, budesonide-formoterol, albuterol, oxybutynin, pantoprazole, ALPRAZolam, potassium chloride, furosemide, ciprofloxacin, and triamcinolone 0.1 % cream : eucerin.  Allergies as of 03/18/2016      Reactions   Codeine Anaphylaxis   Penicillins Anaphylaxis   Sulfa Antibiotics Anaphylaxis      Medication List       Accurate as of 03/18/16 11:59 PM. Always use your most recent med list.          albuterol 108 (90 Base) MCG/ACT inhaler Commonly known as:  PROVENTIL HFA;VENTOLIN HFA Inhale 2 puffs into the lungs every 4 (four) hours as needed for wheezing or shortness of breath.   ALPRAZolam 0.5 MG tablet Commonly known as:  XANAX TAKE  (1)  TABLET TWICE A DAY.   aspirin EC 325 MG tablet Take 325 mg by mouth daily.   budesonide-formoterol 160-4.5 MCG/ACT inhaler Commonly known as:  SYMBICORT Inhale 2 puffs into the lungs 2 (two) times daily.   ciprofloxacin 250 MG tablet Commonly known as:  CIPRO Take 1 tablet (250 mg total) by mouth 2 (two) times daily.   CRANBERRY PO Take 1 tablet by mouth daily.   furosemide 40 MG tablet Commonly known as:  LASIX Take 1 tablet (40 mg total) by mouth daily.   oxybutynin 5 MG tablet Commonly known as:  DITROPAN TAKE (1) TABLET TWICE A DAY.   pantoprazole 40 MG tablet Commonly known as:  PROTONIX TAKE 1 TABLET DAILY   potassium chloride 10 MEQ tablet Commonly known as:  K-DUR,KLOR-CON Take 1 tablet (10 mEq total) by mouth daily. As a potassium supplement   triamcinolone 0.1 % cream : eucerin Crea Apply 1 application topically 3 (three) times daily.   Vitamin D 2000 units tablet Take 2,000 Units by mouth daily.   vitamin E 400 UNIT capsule Take 400 Units by mouth daily.        Follow-up: Return in about 1 month (around 04/18/2016).  Claretta Fraise, M.D.

## 2016-03-29 ENCOUNTER — Other Ambulatory Visit: Payer: Self-pay | Admitting: Family Medicine

## 2016-03-29 ENCOUNTER — Other Ambulatory Visit: Payer: Self-pay | Admitting: Nurse Practitioner

## 2016-03-29 DIAGNOSIS — K219 Gastro-esophageal reflux disease without esophagitis: Secondary | ICD-10-CM

## 2016-03-31 NOTE — Telephone Encounter (Signed)
Refill called to [facility] 

## 2016-03-31 NOTE — Telephone Encounter (Signed)
Please call in xanax with 0 refills 

## 2016-04-18 ENCOUNTER — Ambulatory Visit (INDEPENDENT_AMBULATORY_CARE_PROVIDER_SITE_OTHER): Payer: Medicare Other | Admitting: Family Medicine

## 2016-04-18 ENCOUNTER — Encounter: Payer: Self-pay | Admitting: Family Medicine

## 2016-04-18 VITALS — BP 138/62 | HR 78 | Temp 97.5°F | Ht 65.0 in | Wt 136.0 lb

## 2016-04-18 DIAGNOSIS — I878 Other specified disorders of veins: Secondary | ICD-10-CM | POA: Diagnosis not present

## 2016-04-18 DIAGNOSIS — R601 Generalized edema: Secondary | ICD-10-CM

## 2016-04-18 DIAGNOSIS — J432 Centrilobular emphysema: Secondary | ICD-10-CM | POA: Diagnosis not present

## 2016-04-18 DIAGNOSIS — C3400 Malignant neoplasm of unspecified main bronchus: Secondary | ICD-10-CM

## 2016-04-18 MED ORDER — MIRABEGRON ER 50 MG PO TB24: 50.0000 mg | ORAL_TABLET | Freq: Every day | ORAL | 5 refills | Status: DC

## 2016-04-18 MED ORDER — FUROSEMIDE 20 MG PO TABS
20.0000 mg | ORAL_TABLET | Freq: Every day | ORAL | 2 refills | Status: DC
Start: 2016-04-18 — End: 2016-08-24

## 2016-04-18 NOTE — Progress Notes (Signed)
Subjective:  Patient ID: [name], female    DOB: 11-05-1932  Age: 83 y.o. MRN: [id number]  CC: Follow-up (pt here today following up on her legs, she states they are getting better)   HPI [name] presents for The swelling has gone down significantly. The patient could not tolerate the full dose of Lasix because she was urinating constantly. She decided to take just one half. She also decrease the potassium pill to one half daily. Then she DCed both because she ran out of antibiotic. She has not been short of breath. She is elevating legs, but not above heart. Swelling now under control No new cancer found recently at follow up with her lung Ca doctor.   History [name] has a past medical history of Anxiety disorder; Cancer ([place]); Colon adenoma; COPD (chronic obstructive pulmonary disease) ([place]); Depression; Fall at home (May 17, 2012); GERD (gastroesophageal reflux disease); Hyperlipidemia; Irritable bowel syndrome; Lymphocytic colitis (12/15/2011); Peptic ulcer disease; Pneumonia (2007); Pseudotumor cerebri; Pulmonary embolism ([place]) (2004); and Radiation (4/13,4/15,04/30/14).   She has a past surgical history that includes Cholecystectomy; Colon resection; Abdominal hysterectomy; Brain surgery; Cataract extraction; Upper gastrointestinal endoscopy; Colonoscopy; Lower extremity stents; abdominal aortagram (N/A, 08/10/2012); and Insertion of iliac stent (Bilateral, 08/10/2012).   Her family history includes Cancer in her mother; Heart attack in her mother; Hyperlipidemia in her mother and sister; Hypertension in her mother; Lung cancer in her father.She reports that she has been smoking Cigarettes.  She has a 30.00 pack-year smoking history. She has never used smokeless tobacco. She reports that she does not drink alcohol or use drugs.    ROS Review of Systems  Constitutional: Negative for activity change, appetite change and fever.  HENT: Negative for congestion, rhinorrhea and sore  throat.   Eyes: Negative for visual disturbance.  Respiratory: Negative for cough and shortness of breath.   Cardiovascular: Positive for leg swelling. Negative for chest pain and palpitations.  Gastrointestinal: Negative for abdominal pain, diarrhea and nausea.  Genitourinary: Positive for frequency (current med not helping.). Negative for dysuria.  Musculoskeletal: Negative for arthralgias and myalgias.     Unremarkable  Objective:  BP 138/62   Pulse 78   Temp 97.5 F (36.4 C) (Oral)   Ht _0  (1.651 m)   Wt 136 lb (61.7 kg)   BMI 22.63 kg/m   BP Readings from Last 3 Encounters:  04/18/16 138/62  03/18/16 (!) 126/53  03/15/16 116/63    Wt Readings from Last 3 Encounters:  04/18/16 136 lb (61.7 kg)  03/18/16 133 lb (60.3 kg)  03/15/16 130 lb 3.2 oz (59.1 kg)     Physical Exam  Constitutional: She is oriented to person, place, and time.  Cardiovascular: Normal rate and regular rhythm.   Pulmonary/Chest: Breath sounds normal.  Musculoskeletal: She exhibits edema (2+ BLE).  Neurological: She is alert and oriented to person, place, and time.  Skin: Skin is warm and dry. Rash noted.  The edema has receded dramatically. It is trace only. The erythema has resolved as well    Ct Chest W Contrast  Result Date: 01/06/2016 CLINICAL DATA:  Followup left lower lobe lung carcinoma. Status post radiation therapy. Chronic shortness of breath. Emphysema. EXAM: CT CHEST WITH CONTRAST TECHNIQUE:   IMPRESSION: Stable post treatment changes in left lung. No evidence of recurrent or metastatic carcinoma within the thorax. Moderate emphysema.  No acute findings. Electronically Signed   By: Earle Gell M.D.   On: 01/06/2016 16:33  Assessment & Plan:   [name] was seen today for follow-up.  Diagnoses and all orders for this visit:  Venous stasis of both lower extremities -     CBC with Differential/Platelet -     CMP14+EGFR  Generalized edema -     CMP14+EGFR  Malignant  neoplasm of hilus of lung, unspecified laterality (HCC) -     CMP14+EGFR  Centrilobular emphysema (HCC) -     CMP14+EGFR  Other orders -     furosemide (LASIX) 20 MG tablet; Take 1 tablet (20 mg total) by mouth daily. -     mirabegron ER (MYRBETRIQ) 50 MG TB24 tablet; Take 1 tablet (50 mg total) by mouth daily. To prevent urine incontinence      I have discontinued [name] oxybutynin, ciprofloxacin, and oxybutynin. I have also changed her furosemide. Additionally, I am having her start on mirabegron ER. Lastly, I am having her maintain her aspirin EC, CRANBERRY PO, Vitamin D, vitamin E, budesonide-formoterol, albuterol, potassium chloride, triamcinolone 0.1 % cream : eucerin, ALPRAZolam, and pantoprazole.  Allergies as of 04/18/2016      Reactions   Codeine Anaphylaxis   Penicillins Anaphylaxis   Sulfa Antibiotics Anaphylaxis      Medication List       Accurate as of 04/18/16  1:44 PM. Always use your most recent med list.          albuterol 108 (90 Base) MCG/ACT inhaler Commonly known as:  PROVENTIL HFA;VENTOLIN HFA Inhale 2 puffs into the lungs every 4 (four) hours as needed for wheezing or shortness of breath.   ALPRAZolam 0.5 MG tablet Commonly known as:  XANAX TAKE  (1)  TABLET TWICE A DAY.   aspirin EC 325 MG tablet Take 325 mg by mouth daily.   budesonide-formoterol 160-4.5 MCG/ACT inhaler Commonly known as:  SYMBICORT Inhale 2 puffs into the lungs 2 (two) times daily.   CRANBERRY PO Take 1 tablet by mouth daily.   furosemide 20 MG tablet Commonly known as:  LASIX Take 1 tablet (20 mg total) by mouth daily.   mirabegron ER 50 MG Tb24 tablet Commonly known as:  MYRBETRIQ Take 1 tablet (50 mg total) by mouth daily. To prevent urine incontinence   pantoprazole 40 MG tablet Commonly known as:  PROTONIX TAKE 1 TABLET DAILY   potassium chloride 10 MEQ tablet Commonly known as:  K-DUR,KLOR-CON Take 1 tablet (10 mEq total) by mouth daily. As a potassium  supplement   triamcinolone 0.1 % cream : eucerin Crea Apply 1 application topically 3 (three) times daily.   Vitamin D 2000 units tablet Take 2,000 Units by mouth daily.   vitamin E 400 UNIT capsule Take 400 Units by mouth daily.      Pt. Encouraged to resume lasix & potassium. She agrees. Reiterated need to elevate legs, use compression stockings.  Follow-up: Return in about 6 weeks (around 05/30/2016).  Claretta Fraise, M.D.

## 2016-04-19 LAB — CBC WITH DIFFERENTIAL/PLATELET
Basophils Absolute: 0.1 10*3/uL (ref 0.0–0.2)
Basos: 1 %
EOS (ABSOLUTE): 0.4 10*3/uL (ref 0.0–0.4)
EOS: 4 %
HEMATOCRIT: 31.5 % — AB (ref 34.0–46.6)
HEMOGLOBIN: 10 g/dL — AB (ref 11.1–15.9)
IMMATURE GRANS (ABS): 0 10*3/uL (ref 0.0–0.1)
Immature Granulocytes: 0 %
LYMPHS ABS: 1.9 10*3/uL (ref 0.7–3.1)
Lymphs: 22 %
MCH: 23.1 pg — AB (ref 26.6–33.0)
MCHC: 31.7 g/dL (ref 31.5–35.7)
MCV: 73 fL — AB (ref 79–97)
Monocytes Absolute: 0.7 10*3/uL (ref 0.1–0.9)
Monocytes: 8 %
Neutrophils Absolute: 5.7 10*3/uL (ref 1.4–7.0)
Neutrophils: 65 %
Platelets: 437 10*3/uL — ABNORMAL HIGH (ref 150–379)
RBC: 4.33 x10E6/uL (ref 3.77–5.28)
RDW: 16.4 % — ABNORMAL HIGH (ref 12.3–15.4)
WBC: 8.8 10*3/uL (ref 3.4–10.8)

## 2016-04-19 LAB — CMP14+EGFR
A/G RATIO: 1.3 (ref 1.2–2.2)
ALBUMIN: 3.9 g/dL (ref 3.5–4.7)
ALT: 12 IU/L (ref 0–32)
AST: 16 IU/L (ref 0–40)
Alkaline Phosphatase: 77 IU/L (ref 39–117)
BUN / CREAT RATIO: 23 (ref 12–28)
BUN: 14 mg/dL (ref 8–27)
CHLORIDE: 100 mmol/L (ref 96–106)
CO2: 26 mmol/L (ref 18–29)
Calcium: 8.8 mg/dL (ref 8.7–10.3)
Creatinine, Ser: 0.62 mg/dL (ref 0.57–1.00)
GFR calc non Af Amer: 84 mL/min/{1.73_m2} (ref >59)
GFR, EST AFRICAN AMERICAN: 97 mL/min/{1.73_m2} (ref >59)
GLOBULIN, TOTAL: 3 g/dL (ref 1.5–4.5)
GLUCOSE: 81 mg/dL (ref 65–99)
Potassium: 4.8 mmol/L (ref 3.5–5.2)
SODIUM: 141 mmol/L (ref 134–144)
TOTAL PROTEIN: 6.9 g/dL (ref 6.0–8.5)

## 2016-04-21 LAB — IRON AND TIBC
IRON: 17 ug/dL — AB (ref 27–139)
Iron Saturation: 4 % — CL (ref 15–55)
Total Iron Binding Capacity: 397 ug/dL (ref 250–450)
UIBC: 380 ug/dL — AB (ref 118–369)

## 2016-04-21 LAB — SPECIMEN STATUS REPORT

## 2016-04-21 LAB — FERRITIN: Ferritin: 13 ng/mL — ABNORMAL LOW (ref 15–150)

## 2016-04-25 ENCOUNTER — Other Ambulatory Visit: Payer: Self-pay | Admitting: *Deleted

## 2016-04-25 DIAGNOSIS — D509 Iron deficiency anemia, unspecified: Secondary | ICD-10-CM

## 2016-04-25 MED ORDER — FERROUS FUMARATE 325 (106 FE) MG PO TABS: 1.0000 | ORAL_TABLET | Freq: Every day | ORAL | Status: AC

## 2016-05-11 ENCOUNTER — Other Ambulatory Visit: Payer: Self-pay | Admitting: Family Medicine

## 2016-05-11 NOTE — Telephone Encounter (Signed)
What is the name of the medication? Xanax  Have you contacted your pharmacy to request a refill? YES  Which pharmacy would you like this sent to? [facility]   Patient notified that their request is being sent to the clinical staff for review and that they should receive a call once it is complete. If they do not receive a call within 24 hours they can check with their pharmacy or our office.

## 2016-05-12 NOTE — Telephone Encounter (Signed)
Last filled 03/31/16, last seen 04/18/16. Call in

## 2016-05-13 ENCOUNTER — Other Ambulatory Visit: Payer: Self-pay | Admitting: Nurse Practitioner

## 2016-05-16 MED ORDER — ALPRAZOLAM 0.5 MG PO TABS: ORAL_TABLET | ORAL | 1 refills | Status: DC

## 2016-05-16 NOTE — Telephone Encounter (Signed)
Please call in xanax with 1 refills 

## 2016-05-18 NOTE — Telephone Encounter (Signed)
Spoke to the pharmacist and this rx was called in 05/16/16.

## 2016-05-23 ENCOUNTER — Encounter: Payer: Self-pay | Admitting: Family Medicine

## 2016-05-23 ENCOUNTER — Ambulatory Visit (INDEPENDENT_AMBULATORY_CARE_PROVIDER_SITE_OTHER): Payer: Medicare Other | Admitting: Family Medicine

## 2016-05-23 VITALS — BP 120/57 | HR 80 | Temp 99.6°F | Ht 65.0 in | Wt 127.0 lb

## 2016-05-23 DIAGNOSIS — I878 Other specified disorders of veins: Secondary | ICD-10-CM | POA: Diagnosis not present

## 2016-05-23 DIAGNOSIS — I739 Peripheral vascular disease, unspecified: Secondary | ICD-10-CM | POA: Diagnosis not present

## 2016-05-23 DIAGNOSIS — N393 Stress incontinence (female) (male): Secondary | ICD-10-CM | POA: Diagnosis not present

## 2016-05-23 MED ORDER — OXYBUTYNIN CHLORIDE 5 MG PO TABS: 5.0000 mg | ORAL_TABLET | Freq: Four times a day (QID) | ORAL | 2 refills | Status: DC

## 2016-05-23 NOTE — Progress Notes (Signed)
Subjective:  Patient ID: [name], female    DOB: [birth year]  Age: 83 y.o. MRN: [id number]  CC: Follow-up (pt here today following up on her legs. She states they are better but she still has a few spots on them. She also wants to see if she can get a refill on her xanax.)   HPI [name] presents for Lasix feeling much improved. She says the itch occasionally. The spot she mention above consists of 2 miniscule areas of excoriation from the itching. That has gotten better. She feels that the swelling has resolved essentially. Patient does have periodic anxiety for which she uses Xanax regularly and has for many years. Of note is on chart review shows that the refill was sent in for that approximately 1 week ago.   History [name] has a past medical history of Anxiety disorder; Cancer ([place]); Colon adenoma; COPD (chronic obstructive pulmonary disease) ([place]); Depression; Fall at home (May 17, 2012); GERD (gastroesophageal reflux disease); Hyperlipidemia; Irritable bowel syndrome; Lymphocytic colitis (12/15/2011); Peptic ulcer disease; Pneumonia (2007); Pseudotumor cerebri; Pulmonary embolism ([place]) (2004); and Radiation (4/13,4/15,04/30/14).   She has a past surgical history that includes Cholecystectomy; Colon resection; Abdominal hysterectomy; Brain surgery; Cataract extraction; Upper gastrointestinal endoscopy; Colonoscopy; Lower extremity stents; abdominal aortagram (N/A, 08/10/2012); and Insertion of iliac stent (Bilateral, 08/10/2012).   Her family history includes Cancer in her mother; Heart attack in her mother; Hyperlipidemia in her mother and sister; Hypertension in her mother; Lung cancer in her father.She reports that she has been smoking Cigarettes.  She has a 30.00 pack-year smoking history. She has never used smokeless tobacco. She reports that she does not drink alcohol or use drugs.    ROS Review of Systems  Constitutional: Negative for activity change, appetite change and  fever.  Respiratory: Negative for chest tightness.   Cardiovascular: Positive for chest pain and leg swelling (much improved still mildly if she doesn't keep her legs elevated or take her medicine, furosemide).  Genitourinary: Positive for urgency. Negative for difficulty urinating (unable to hold it with urge or stress).    Objective:  BP (!) 120/57   Pulse 80   Temp 99.6 F (37.6 C) (Oral)   Ht '5\' 5"'$  (1.651 m)   Wt 127 lb (57.6 kg)   BMI 21.13 kg/m   BP Readings from Last 3 Encounters:  05/23/16 (!) 120/57  04/18/16 138/62  03/18/16 (!) 126/53    Wt Readings from Last 3 Encounters:  05/23/16 127 lb (57.6 kg)  04/18/16 136 lb (61.7 kg)  03/18/16 133 lb (60.3 kg)     Physical Exam  Constitutional: She is oriented to person, place, and time. She appears well-developed and well-nourished. No distress.  Musculoskeletal: Normal range of motion. She exhibits edema (trace to 1+ at the ankles.).  Neurological: She is alert and oriented to person, place, and time.  Skin: Skin is warm and dry. Rash: still some colorless lichenification a. No erythema.      Assessment & Plan:   [name] was seen today for follow-up.  Diagnoses and all orders for this visit:  Peripheral vascular disease ([place])  Venous stasis of both lower extremities  Stress incontinence of urine  Other orders -     oxybutynin (DITROPAN) 5 MG tablet; Take 1 tablet (5 mg total) by mouth 4 (four) times daily.       I have discontinued [name]'s mirabegron ER. I am also having her start on oxybutynin. Additionally, I am having her  maintain her aspirin EC, CRANBERRY PO, Vitamin D, vitamin E, budesonide-formoterol, albuterol, potassium chloride, triamcinolone 0.1 % cream : eucerin, pantoprazole, furosemide, and ALPRAZolam. We will continue to administer ferrous fumarate.  Allergies as of 05/23/2016      Reactions   Codeine Anaphylaxis   Penicillins Anaphylaxis   Sulfa Antibiotics Anaphylaxis        Medication List       Accurate as of 05/23/16 11:20 PM. Always use your most recent med list.          albuterol 108 (90 Base) MCG/ACT inhaler Commonly known as:  PROVENTIL HFA;VENTOLIN HFA Inhale 2 puffs into the lungs every 4 (four) hours as needed for wheezing or shortness of breath.   ALPRAZolam 0.5 MG tablet Commonly known as:  XANAX TAKE  (1)  TABLET TWICE A DAY.   aspirin EC 325 MG tablet Take 325 mg by mouth daily.   budesonide-formoterol 160-4.5 MCG/ACT inhaler Commonly known as:  SYMBICORT Inhale 2 puffs into the lungs 2 (two) times daily.   CRANBERRY PO Take 1 tablet by mouth daily.   furosemide 20 MG tablet Commonly known as:  LASIX Take 1 tablet (20 mg total) by mouth daily.   oxybutynin 5 MG tablet Commonly known as:  DITROPAN Take 1 tablet (5 mg total) by mouth 4 (four) times daily.   pantoprazole 40 MG tablet Commonly known as:  PROTONIX TAKE 1 TABLET DAILY   potassium chloride 10 MEQ tablet Commonly known as:  K-DUR,KLOR-CON Take 1 tablet (10 mEq total) by mouth daily. As a potassium supplement   triamcinolone 0.1 % cream : eucerin Crea Apply 1 application topically 3 (three) times daily.   Vitamin D 2000 units tablet Take 2,000 Units by mouth daily.   vitamin E 400 UNIT capsule Take 400 Units by mouth daily.       Increased dose of ditropan at pt. Request. Now ax max. Side effects reviewed.Patient would like for me to take over exclusive prescribing of her Xanax. We will discuss that again in the near future. For now she has no open prescription sent in just last week by her previous primary provider. She should fill that and once that supplies exhausted contact me. As long as she is willing to not get those medications for many of her provider then it seems reasonable to continue that medicine more so than trying to taper her off of it after many years of use. Follow-up: No Follow-up on file.  Claretta Fraise, M.D.

## 2016-05-26 NOTE — Addendum Note (Signed)
Addended by: Lianne Cure A on: 05/26/2016 04:26 PM   Modules accepted: Orders

## 2016-05-27 ENCOUNTER — Encounter: Payer: Self-pay | Admitting: Vascular Surgery

## 2016-06-02 ENCOUNTER — Ambulatory Visit (INDEPENDENT_AMBULATORY_CARE_PROVIDER_SITE_OTHER): Payer: Medicare Other | Admitting: Vascular Surgery

## 2016-06-02 ENCOUNTER — Encounter: Payer: Self-pay | Admitting: Vascular Surgery

## 2016-06-02 ENCOUNTER — Ambulatory Visit (HOSPITAL_COMMUNITY)
Admission: RE | Admit: 2016-06-02 | Discharge: 2016-06-02 | Disposition: A | Discharge status: Home / Self Care | Payer: Medicare Other | Source: Ambulatory Visit | Attending: Vascular Surgery | Admitting: Vascular Surgery

## 2016-06-02 VITALS — BP 150/74 | HR 96 | Temp 97.6°F | Resp 18 | Ht 65.0 in | Wt 128.0 lb

## 2016-06-02 DIAGNOSIS — I82812 Embolism and thrombosis of superficial veins of left lower extremities: Secondary | ICD-10-CM | POA: Insufficient documentation

## 2016-06-02 DIAGNOSIS — I878 Other specified disorders of veins: Secondary | ICD-10-CM | POA: Diagnosis not present

## 2016-06-02 DIAGNOSIS — M7989 Other specified soft tissue disorders: Secondary | ICD-10-CM

## 2016-06-02 NOTE — Progress Notes (Signed)
Referring Physician: Dr. Elmarie [facility] family medicine  Patient name: [name] MRN: [id number] DOB: May 20, 1932 Sex: female  REASON FOR CONSULT: Bilateral leg swelling  HPI: [name] is a 83 y.o. female referred for evaluation of bilateral leg swelling. She apparently had severe swelling of her leg several weeks ago which resulted in weeping of fluid from the scan. She never developed a frank ulceration. Currently her legs are still swollen with a rash but no open wounds. Patient is well known to [place] from previous external iliac artery stenting. She also has a history of bilateral moderate carotid stenosis. She denies any prior skin ulcerations on her legs. She denies rest pain. She does develop easy fatigue in her legs. She does not really walk much because she states her legs give out easily. She sits most of the time with her legs in a dependent position. She states that her daughter has gotten some compression stockings for her but she has not worn them yet. She has a history of severe COPD and lung cancer previously treated with radiation that is currently in remission.  Past Medical History:  Diagnosis Date  . Anxiety disorder   . Cancer ([place])   . Colon adenoma   . COPD (chronic obstructive pulmonary disease) ([place])   . Depression   . Fall at home May 17, 2012   Pt. fell in tub today  . GERD (gastroesophageal reflux disease)   . Hyperlipidemia   . Irritable bowel syndrome   . Lymphocytic colitis 12/15/2011  . Peptic ulcer disease   . Pneumonia 2007   prolonged ICU stay on vent with trach  . Pseudotumor cerebri   . Pulmonary embolism ([place]) 2004   off anticoagulation  . Radiation 4/13,4/15,04/30/14   Left lower lung   Past Surgical History:  Procedure Laterality Date  . ABDOMINAL AORTAGRAM N/A 08/10/2012   Procedure: ABDOMINAL Maxcine Ham;  Surgeon: Elam Dutch, MD;  Location[facility] C[facility] LAB;  Service: Cardiovascular;  Laterality: N/A;  . ABDOMINAL  HYSTERECTOMY    . BRAIN SURGERY     10 YEARS AGO   . CATARACT EXTRACTION     BOTH EYES  . CHOLECYSTECTOMY    . COLON RESECTION    . COLONOSCOPY    . INSERTION OF ILIAC STENT Bilateral 08/10/2012   Procedure: INSERTION OF ILIAC STENT;  Surgeon: Elam Dutch, MD;  Location: [facility] CATH LAB;  Service: Cardiovascular;  Laterality: Bilateral;  . Lower extremity stents     Per Dr. Oneida Alar  . UPPER GASTROINTESTINAL ENDOSCOPY      Family History  Problem Relation Age of Onset  . Lung cancer Father   . Cancer Mother   . Hyperlipidemia Mother   . Hypertension Mother   . Heart attack Mother   . Hyperlipidemia Sister   . Colon cancer Neg Hx     SOCIAL HISTORY: Social History   Social History  . Marital status: Widowed    Spouse name: N/A  . Number of children: N/A  . Years of education: N/A   Occupational History  . retired Retired   Social History Main Topics  . Smoking status: Current Every Day Smoker    Packs/day: 0.50    Years: 60.00    Types: Cigarettes  . Smokeless tobacco: Never Used     Comment: less than 1/2 PPD (07/23/15) --pt states "I will wait until all of this is over".  Heaviest   . Alcohol use No  . Drug  use: No  . Sexual activity: No   Other Topics Concern  . Not on file   Social History Narrative  . No narrative on file    Allergies  Allergen Reactions  . Codeine Anaphylaxis  . Penicillins Anaphylaxis  . Sulfa Antibiotics Anaphylaxis    Current Outpatient Prescriptions  Medication Sig Dispense Refill  . albuterol (PROVENTIL HFA;VENTOLIN HFA) 108 (90 Base) MCG/ACT inhaler Inhale 2 puffs into the lungs every 4 (four) hours as needed for wheezing or shortness of breath. 8.5 g 5  . ALPRAZolam (XANAX) 0.5 MG tablet TAKE  (1)  TABLET TWICE A DAY. 60 tablet 1  . aspirin EC 325 MG tablet Take 325 mg by mouth daily.    . budesonide-formoterol (SYMBICORT) 160-4.5 MCG/ACT inhaler Inhale 2 puffs into the lungs 2 (two) times daily.    . Cholecalciferol  (VITAMIN D) 2000 UNITS tablet Take 2,000 Units by mouth daily.    Marland Kitchen CRANBERRY PO Take 1 tablet by mouth daily.    . furosemide (LASIX) 20 MG tablet Take 1 tablet (20 mg total) by mouth daily. 30 tablet 2  . oxybutynin (DITROPAN) 5 MG tablet Take 1 tablet (5 mg total) by mouth 4 (four) times daily. 120 tablet 2  . pantoprazole (PROTONIX) 40 MG tablet TAKE 1 TABLET DAILY 30 tablet 2  . potassium chloride SA (K-DUR,KLOR-CON) 10 MEQ tablet Take 1 tablet (10 mEq total) by mouth daily. As a potassium supplement 30 tablet 2  . Triamcinolone Acetonide (TRIAMCINOLONE 0.1 % CREAM : EUCERIN) CREA Apply 1 application topically 3 (three) times daily. 1 each 0  . vitamin E 400 UNIT capsule Take 400 Units by mouth daily.     Current Facility-Administered Medications  Medication Dose Route Frequency Provider Last Rate Last Dose  . ferrous fumarate (HEMOCYTE - 106 mg FE) tablet 106 mg of iron  1 tablet Oral Daily Stacks, Cletus Gash, MD        ROS:   General:  No weight loss, Fever, chills  HEENT: No recent headaches, no nasal bleeding, no visual changes, no sore throat  Neurologic: No dizziness, blackouts, seizures. No recent symptoms of stroke or mini- stroke. No recent episodes of slurred speech, or temporary blindness.  Cardiac: No recent episodes of chest pain/pressure, no shortness of breath at rest.  + shortness of breath with exertion.  Denies history of atrial fibrillation or irregular heartbeat  Vascular: No history of rest pain in feet.  + history of claudication.  No history of non-healing ulcer, No history of DVT   Pulmonary: No home oxygen, no productive cough, no hemoptysis,  No asthma or wheezing  Musculoskeletal:  [ ]  Arthritis, [ ]  Low back pain,  [ ]  Joint pain  Hematologic:No history of hypercoagulable state.  No history of easy bleeding.  No history of anemia  Gastrointestinal: No hematochezia or melena,  No gastroesophageal reflux, no trouble swallowing  Urinary: [ ]  chronic Kidney  disease, [ ]  on HD - [ ]  MWF or [ ]  TTHS, [ ]  Burning with urination, [ ]  Frequent urination, [ ]  Difficulty urinating;   Skin: No rashes  Psychological: No history of anxiety,  No history of depression   Physical Examination  Vitals:   06/02/16 1333 06/02/16 1334  BP: (!) 152/72 (!) 150/74  Pulse: 96   Resp: 18   Temp: 97.6 F (36.4 C)   TempSrc: Oral   SpO2: (!) 86%   Weight: 128 lb (58.1 kg)   Height: 5\' 5"  (1.651 m)  Body mass index is 21.3 kg/m.  General:  Alert and oriented, no acute distress HEENT: Normal Neck: Bilateral carotid bruits Pulmonary: Clear to auscultation bilaterally Cardiac: Regular Rate and Rhythm without murmur Abdomen: Soft, non-tender, non-distended, no mass Skin: No rash Extremity Pulses:  2+ radial, brachial, femoral, absent popliteal dorsalis pedis, posterior tibial pulses bilaterally Musculoskeletal: No deformity diffuse below knee edema with some pitting papular erythematous rash bilateral lower extremities below the knee diffusely primarily in the gaiter area  Neurologic: Upper and lower extremity motor 5/5 and symmetric  DATA:  Patient had a venous duplex today which showed no evidence of DVT. He did have thrombosis of the left lesser saphenous vein. This was chronic in appearance. She had no reflux in the deep or superficial system.  ASSESSMENT:  Bilateral lower extremity dependent edema most likely secondary to her her sitting with legs dependent all day long possibly also due to hypoalbuminemia with some component of malnutrition. She does not have any venous reflux. She has known peripheral arterial disease but this is not contributing to her leg swelling.   PLAN:  Patient was counseled and elevating her legs as much as possible avoiding sitting in a dependent position wearing compression stockings every day to prevent skin breakdown from the swelling. If the swelling persists other possible etiologies to consider would be malnutrition  renal or cardiac dysfunction possibly hepatic dysfunction. I think at this point we have ruled out a vascular source of her lower extremity edema. The patient has follow-up scheduled with [place] in September regarding her peripheral arterial disease and carotid occlusive disease.   Ruta Hinds, MD [facility] Office: [phone] Pager: [phone]

## 2016-06-21 ENCOUNTER — Telehealth: Payer: Self-pay | Admitting: *Deleted

## 2016-06-21 NOTE — Telephone Encounter (Signed)
CALLED PATIENT TO INFORM OF LAB AND CT FOR 07-11-16 AND HER FU ON 07-12-16 WITH ALISON PERKINS @ 10 AM FOR RESULTS, SPOKE WITH PATIENT AND SHE IS AWARE OF THESE APPTS.

## 2016-06-23 ENCOUNTER — Other Ambulatory Visit: Payer: Self-pay | Admitting: Nurse Practitioner

## 2016-06-23 DIAGNOSIS — K219 Gastro-esophageal reflux disease without esophagitis: Secondary | ICD-10-CM

## 2016-06-30 ENCOUNTER — Ambulatory Visit
Admission: RE | Admit: 2016-06-30 | Discharge: 2016-06-30 | Disposition: A | Discharge status: Home / Self Care | Payer: Medicare Other | Source: Ambulatory Visit | Attending: Radiation Oncology | Admitting: Radiation Oncology

## 2016-06-30 DIAGNOSIS — Z9889 Other specified postprocedural states: Secondary | ICD-10-CM | POA: Insufficient documentation

## 2016-06-30 DIAGNOSIS — Z79899 Other long term (current) drug therapy: Secondary | ICD-10-CM | POA: Insufficient documentation

## 2016-06-30 DIAGNOSIS — Z8249 Family history of ischemic heart disease and other diseases of the circulatory system: Secondary | ICD-10-CM | POA: Insufficient documentation

## 2016-06-30 DIAGNOSIS — E785 Hyperlipidemia, unspecified: Secondary | ICD-10-CM | POA: Insufficient documentation

## 2016-06-30 DIAGNOSIS — F419 Anxiety disorder, unspecified: Secondary | ICD-10-CM | POA: Insufficient documentation

## 2016-06-30 DIAGNOSIS — Z801 Family history of malignant neoplasm of trachea, bronchus and lung: Secondary | ICD-10-CM | POA: Insufficient documentation

## 2016-06-30 DIAGNOSIS — Z808 Family history of malignant neoplasm of other organs or systems: Secondary | ICD-10-CM | POA: Insufficient documentation

## 2016-06-30 DIAGNOSIS — Z923 Personal history of irradiation: Secondary | ICD-10-CM | POA: Insufficient documentation

## 2016-06-30 DIAGNOSIS — C3432 Malignant neoplasm of lower lobe, left bronchus or lung: Secondary | ICD-10-CM | POA: Insufficient documentation

## 2016-06-30 DIAGNOSIS — F17210 Nicotine dependence, cigarettes, uncomplicated: Secondary | ICD-10-CM | POA: Insufficient documentation

## 2016-06-30 DIAGNOSIS — J449 Chronic obstructive pulmonary disease, unspecified: Secondary | ICD-10-CM | POA: Insufficient documentation

## 2016-07-11 ENCOUNTER — Ambulatory Visit
Admission: RE | Admit: 2016-07-11 | Discharge: 2016-07-11 | Disposition: A | Discharge status: Home / Self Care | Payer: Medicare Other | Source: Ambulatory Visit | Attending: Radiation Oncology | Admitting: Radiation Oncology

## 2016-07-11 ENCOUNTER — Encounter (HOSPITAL_COMMUNITY): Payer: Self-pay

## 2016-07-11 ENCOUNTER — Ambulatory Visit (HOSPITAL_COMMUNITY)
Admission: RE | Admit: 2016-07-11 | Discharge: 2016-07-11 | Disposition: A | Discharge status: Home / Self Care | Payer: Medicare Other | Source: Ambulatory Visit | Attending: Radiation Oncology | Admitting: Radiation Oncology

## 2016-07-11 DIAGNOSIS — C3400 Malignant neoplasm of unspecified main bronchus: Secondary | ICD-10-CM

## 2016-07-11 DIAGNOSIS — I700 Atherosclerosis of aorta: Secondary | ICD-10-CM | POA: Diagnosis not present

## 2016-07-11 DIAGNOSIS — J439 Emphysema, unspecified: Secondary | ICD-10-CM | POA: Diagnosis not present

## 2016-07-11 DIAGNOSIS — I2510 Atherosclerotic heart disease of native coronary artery without angina pectoris: Secondary | ICD-10-CM | POA: Diagnosis not present

## 2016-07-11 DIAGNOSIS — C3432 Malignant neoplasm of lower lobe, left bronchus or lung: Secondary | ICD-10-CM

## 2016-07-11 DIAGNOSIS — K449 Diaphragmatic hernia without obstruction or gangrene: Secondary | ICD-10-CM | POA: Diagnosis not present

## 2016-07-11 DIAGNOSIS — J449 Chronic obstructive pulmonary disease, unspecified: Secondary | ICD-10-CM | POA: Diagnosis not present

## 2016-07-11 LAB — BASIC METABOLIC PANEL
ANION GAP: 8 meq/L (ref 3–11)
BUN: 22.4 mg/dL (ref 7.0–26.0)
CALCIUM: 9.2 mg/dL (ref 8.4–10.4)
CO2: 27 mEq/L (ref 22–29)
CREATININE: 0.7 mg/dL (ref 0.6–1.1)
Chloride: 107 mEq/L (ref 98–109)
EGFR: 75 mL/min/{1.73_m2} — ABNORMAL LOW (ref >90)
Glucose: 89 mg/dl (ref 70–140)
POTASSIUM: 4.3 meq/L (ref 3.5–5.1)
Sodium: 141 mEq/L (ref 136–145)

## 2016-07-11 MED ORDER — IOPAMIDOL (ISOVUE-300) INJECTION 61%
75.0000 mL | Freq: Once | INTRAVENOUS | Status: AC | PRN
Start: 2016-07-11 — End: 2016-07-11
  Administered 2016-07-11: 75 mL via INTRAVENOUS

## 2016-07-11 MED ORDER — IOPAMIDOL (ISOVUE-300) INJECTION 61%
INTRAVENOUS | Status: AC
  Filled 2016-07-11: qty 75

## 2016-07-11 NOTE — Progress Notes (Deleted)
  [name] 83 y.o. woman with malignant neoplasm of lower lobe of left lung radiation completed , one year FU.  Weight changes, if any: Respiratory complaints, if any:  Hemoptysis, if any:   Swallowing Problems/Pain/Difficulty swallowing: Appetite : Pain: When is next chemo scheduled?: Imaging:07-11-16 CT chest w contrast Lab work from of chart:N/A

## 2016-07-11 NOTE — Progress Notes (Addendum)
  [name]. [name] 83 y.o. woman with malignant neoplasm of lower lobe of left lung radiation completed ,review 07-11-16 CT chest w contrast, one year FU  Weight changes, if any: Wt Readings from Last 3 Encounters:  07/12/16 123 lb 6.4 oz (56 kg)  06/02/16 128 lb (58.1 kg)  05/23/16 127 lb (57.6 kg)   Respiratory complaints, if any: SOB with exertion Hemoptysis, if YWV:PXTG   Swallowing Problems/Pain/Difficulty swallowing:Daughter reports her mother is having swallowing problems while eating and drinking. Appetite :Good eating two meals daily with snacks . Pain:None When is next chemo scheduled?:N/A Imaging:07-11-16 CT chest w contrast Lab work from of chart:N/A BP (!) 102/59   Pulse 85   Temp 98.1 F (36.7 C) (Oral)   Resp 16   Ht 5\' 5"  (1.651 m)   Wt 123 lb 6.4 oz (56 kg)   SpO2 93%   BMI 20.53 kg/m

## 2016-07-12 ENCOUNTER — Ambulatory Visit
Admission: RE | Admit: 2016-07-12 | Discharge: 2016-07-12 | Disposition: A | Discharge status: Home / Self Care | Payer: Medicare Other | Source: Ambulatory Visit | Attending: Radiation Oncology | Admitting: Radiation Oncology

## 2016-07-12 ENCOUNTER — Encounter: Payer: Self-pay | Admitting: Radiation Oncology

## 2016-07-12 VITALS — BP 102/59 | HR 85 | Temp 98.1°F | Resp 16 | Ht 65.0 in | Wt 123.4 lb

## 2016-07-12 DIAGNOSIS — C3432 Malignant neoplasm of lower lobe, left bronchus or lung: Secondary | ICD-10-CM | POA: Diagnosis not present

## 2016-07-12 DIAGNOSIS — Z8249 Family history of ischemic heart disease and other diseases of the circulatory system: Secondary | ICD-10-CM | POA: Diagnosis not present

## 2016-07-12 DIAGNOSIS — Z8709 Personal history of other diseases of the respiratory system: Secondary | ICD-10-CM | POA: Diagnosis not present

## 2016-07-12 DIAGNOSIS — Z801 Family history of malignant neoplasm of trachea, bronchus and lung: Secondary | ICD-10-CM | POA: Diagnosis not present

## 2016-07-12 DIAGNOSIS — F419 Anxiety disorder, unspecified: Secondary | ICD-10-CM | POA: Diagnosis not present

## 2016-07-12 DIAGNOSIS — J449 Chronic obstructive pulmonary disease, unspecified: Secondary | ICD-10-CM | POA: Diagnosis not present

## 2016-07-12 DIAGNOSIS — Z808 Family history of malignant neoplasm of other organs or systems: Secondary | ICD-10-CM | POA: Diagnosis not present

## 2016-07-12 DIAGNOSIS — F17210 Nicotine dependence, cigarettes, uncomplicated: Secondary | ICD-10-CM | POA: Diagnosis not present

## 2016-07-12 DIAGNOSIS — Z9889 Other specified postprocedural states: Secondary | ICD-10-CM | POA: Diagnosis not present

## 2016-07-12 DIAGNOSIS — Z923 Personal history of irradiation: Secondary | ICD-10-CM | POA: Diagnosis not present

## 2016-07-12 DIAGNOSIS — Z08 Encounter for follow-up examination after completed treatment for malignant neoplasm: Secondary | ICD-10-CM | POA: Diagnosis not present

## 2016-07-12 DIAGNOSIS — Z85118 Personal history of other malignant neoplasm of bronchus and lung: Secondary | ICD-10-CM | POA: Diagnosis not present

## 2016-07-12 DIAGNOSIS — Z79899 Other long term (current) drug therapy: Secondary | ICD-10-CM | POA: Diagnosis not present

## 2016-07-12 DIAGNOSIS — E785 Hyperlipidemia, unspecified: Secondary | ICD-10-CM | POA: Diagnosis not present

## 2016-07-13 ENCOUNTER — Other Ambulatory Visit: Payer: Self-pay | Admitting: Radiation Oncology

## 2016-07-13 DIAGNOSIS — C3432 Malignant neoplasm of lower lobe, left bronchus or lung: Secondary | ICD-10-CM

## 2016-07-13 NOTE — Progress Notes (Signed)
Radiation Oncology         [phone] ________________________________  Name: [name] MRN: [id number]  Date: 07/12/2016  DOB: November 21, 1932     Follow-Up Visit Note  CC: Claretta Fraise, MD  Rigoberto Noel, MD  Diagnosis:   Putative Stage I NSCLC of the Left Lower Lobe without tissue confirmation    ICD-10-CM   1. Malignant neoplasm of lower lobe of left lung ([place]) C34.32     Interval Since Last Radiation:  2 years, 3 months  04/23/14-04/30/14: SBRT, 54Gy  to the LLL  Narrative:  In summary, this is a pleasant 83 y.o. patient found to have a lesion in the left lower lobe on imaging, which characteristically favor a malignant abnormality. She was not felt to be appropriate medical candidate to undergo bronchoscopy for tissue biopsy was T guidance. She subsequently reviewed the risks and benefits of this with Dr. Pablo Ledger and elected to undergo definitive SBRT which she completed in April 2016. She's been followed in surveillance with serial CT scans, and has been without evidence of disease. Her most recent CT on 07/11/16 revealed post radiotherapy change and no new lesions or adenopathy. She does have radiographic evidence of COPD and continues follow up with Dr. Elsworth Soho.  On review of systems, the patient reports that she is doing well overall. She denies any chest pain, shortness of breath, cough, fevers, chills, night sweats, unintended weight changes. She denies any bowel or bladder disturbances, and denies abdominal pain, nausea or vomiting. She denies any new musculoskeletal or joint aches or pains, new skin lesions or concerns. A complete review of systems is obtained and is otherwise negative.   Past Medical History:  Past Medical History:  Diagnosis Date  . Anxiety disorder   . Cancer ([place])   . Colon adenoma   . COPD (chronic obstructive pulmonary disease) ([place])   . Depression   . Fall at home May 17, 2012   Pt. fell in tub today  . GERD (gastroesophageal reflux disease)     . Hyperlipidemia   . Irritable bowel syndrome   . Lymphocytic colitis 12/15/2011  . Peptic ulcer disease   . Pneumonia 2007   prolonged ICU stay on vent with trach  . Pseudotumor cerebri   . Pulmonary embolism ([place]) 2004   off anticoagulation  . Radiation 4/13,4/15,04/30/14   Left lower lung    Past Surgical History: Past Surgical History:  Procedure Laterality Date  . ABDOMINAL AORTAGRAM N/A 08/10/2012   Procedure: ABDOMINAL Maxcine Ham;  Surgeon: Elam Dutch, MD;  Location: [facility] CATH LAB;  Service: Cardiovascular;  Laterality: N/A;  . ABDOMINAL HYSTERECTOMY    . BRAIN SURGERY     10 YEARS AGO   . CATARACT EXTRACTION     BOTH EYES  . CHOLECYSTECTOMY    . COLON RESECTION    . COLONOSCOPY    . INSERTION OF ILIAC STENT Bilateral 08/10/2012   Procedure: INSERTION OF ILIAC STENT;  Surgeon: Elam Dutch, MD;  Location: [facility] CATH LAB;  Service: Cardiovascular;  Laterality: Bilateral;  . Lower extremity stents     Per Dr. Oneida Alar  . UPPER GASTROINTESTINAL ENDOSCOPY      Social History:  Social History   Social History  . Marital status: Widowed    Spouse name: N/A  . Number of children: N/A  . Years of education: N/A   Occupational History  . retired Retired   Social History Main Topics  . Smoking status: Current Every Day Smoker  Packs/day: 0.50    Years: 60.00    Types: Cigarettes  . Smokeless tobacco: Never Used     Comment: less than 1/2 PPD (07/23/15) --pt states "I will wait until all of this is over".  Heaviest   . Alcohol use No  . Drug use: No  . Sexual activity: No   Other Topics Concern  . Not on file   Social History Narrative  . No narrative on file  The patient is accompanied by her daughter and granddaughter.  Family History: Family History  Problem Relation Age of Onset  . Lung cancer Father   . Cancer Mother   . Hyperlipidemia Mother   . Hypertension Mother   . Heart attack Mother   . Hyperlipidemia Sister   . Colon cancer Neg Hx                                   ALLERGIES:  is allergic to codeine; penicillins; and sulfa antibiotics.  Meds: Current Outpatient Prescriptions  Medication Sig Dispense Refill  . albuterol (PROVENTIL HFA;VENTOLIN HFA) 108 (90 Base) MCG/ACT inhaler Inhale 2 puffs into the lungs every 4 (four) hours as needed for wheezing or shortness of breath. 8.5 g 5  . ALPRAZolam (XANAX) 0.5 MG tablet TAKE  (1)  TABLET TWICE A DAY. 60 tablet 1  . aspirin EC 325 MG tablet Take 325 mg by mouth daily.    . budesonide-formoterol (SYMBICORT) 160-4.5 MCG/ACT inhaler Inhale 2 puffs into the lungs 2 (two) times daily.    . Cholecalciferol (VITAMIN D) 2000 UNITS tablet Take 2,000 Units by mouth daily.    Marland Kitchen oxybutynin (DITROPAN) 5 MG tablet Take 1 tablet (5 mg total) by mouth 4 (four) times daily. 120 tablet 2  . potassium chloride SA (K-DUR,KLOR-CON) 10 MEQ tablet Take 1 tablet (10 mEq total) by mouth daily. As a potassium supplement 30 tablet 2  . vitamin E 400 UNIT capsule Take 400 Units by mouth daily.    Marland Kitchen CRANBERRY PO Take 1 tablet by mouth daily.    . furosemide (LASIX) 20 MG tablet Take 1 tablet (20 mg total) by mouth daily. (Patient not taking: Reported on 07/12/2016) 30 tablet 2  . pantoprazole (PROTONIX) 40 MG tablet TAKE 1 TABLET DAILY (Patient not taking: Reported on 07/12/2016) 30 tablet 5  . Triamcinolone Acetonide (TRIAMCINOLONE 0.1 % CREAM : EUCERIN) CREA Apply 1 application topically 3 (three) times daily. (Patient not taking: Reported on 07/12/2016) 1 each 0   Current Facility-Administered Medications  Medication Dose Route Frequency Provider Last Rate Last Dose  . ferrous fumarate (HEMOCYTE - 106 mg FE) tablet 106 mg of iron  1 tablet Oral Daily Claretta Fraise, MD        Physical Findings:  height is 5\' 5"  (1.651 m) and weight is 123 lb 6.4 oz (56 kg). Her oral temperature is 98.1 F (36.7 C). Her blood pressure is 102/59 (abnormal) and her pulse is 85. Her respiration is 16 and oxygen saturation  is 93%.    In general this is a well appearing Caucasian female in no acute distress. She is alert and oriented x4 and appropriate throughout the examination. HEENT reveals that the patient is normocephalic, atraumatic. EOMs are intact. PERRLA. Skin is intact without any evidence of gross lesions. Cardiovascular exam reveals a regular rate and rhythm, no clicks rubs or murmurs are auscultated. Chest is clear to auscultation bilaterally. Lymphatic assessment  is performed and does not reveal any adenopathy in the cervical, supraclavicular, axillary, or inguinal chains. Abdomen has active bowel sounds in all quadrants and is intact.    Lab Findings: Lab Results  Component Value Date   WBC 8.8 04/18/2016   WBC 13.8 (H) 02/05/2014   HGB 10.0 (L) 04/18/2016   HCT 31.5 (L) 04/18/2016   PLT 437 (H) 04/18/2016    Lab Results  Component Value Date   NA 141 07/11/2016   K 4.3 07/11/2016   CHLORIDE 107 07/11/2016   CO2 27 07/11/2016   GLUCOSE 89 07/11/2016   BUN 22.4 07/11/2016   CREATININE 0.7 07/11/2016   BILITOT <0.2 04/18/2016   ALKPHOS 77 04/18/2016   AST 16 04/18/2016   ALT 12 04/18/2016   PROT 6.9 04/18/2016   ALBUMIN 3.9 04/18/2016   CALCIUM 9.2 07/11/2016   ANIONGAP 8 07/11/2016   ANIONGAP 15 01/23/2014    Radiographic Findings: Ct Chest W Contrast  Result Date: 07/11/2016 CLINICAL DATA:  Status post radiation therapy for left lower lobe lung cancer. COPD. Smoker with chronic shortness of breath on exertion. EXAM: CT CHEST WITH CONTRAST TECHNIQUE: Multidetector CT imaging of the chest was performed during intravenous contrast administration. CONTRAST:  19mL ISOVUE-300 IOPAMIDOL (ISOVUE-300) INJECTION 61% COMPARISON:  01/06/2016 FINDINGS: Cardiovascular: Extensive colonic diverticulosis. Mild cardiomegaly, without pericardial or pleural effusion. Multivessel coronary artery atherosclerosis. No central pulmonary embolism, on this non-dedicated study. Mediastinum/Nodes: No  supraclavicular adenopathy. Small middle mediastinal nodes are not significantly changed. None are pathologic by size criteria. No hilar adenopathy. Tiny hiatal hernia. Lungs/Pleura: Similar mild left-sided pleural thickening. Advanced bullous type emphysema. Lower lobe predominant bronchial wall thickening. Minimal motion degradation inferiorly. Posterior left upper lobe and superior segment left lower lobe consolidation is felt to be slightly increased and presumably radiation induced. Example image 58/series 5. No well-defined locally recurrent disease. Upper Abdomen: Left hepatic lobe cysts. Lateral left hepatic lobe probable hemangioma with secondary transient hepatic attenuation difference. Example image 139/series 2. Similar. Normal imaged portions of the spleen, adrenal glands, kidneys. Cholecystectomy. Pancreatic atrophy. Musculoskeletal: Osteopenia. Suspect postsurgical and/or radiation changes involving posterior left upper ribs, similar. IMPRESSION: 1. Slight increase in posterior left upper lobe and superior segment left lower lobe consolidation, likely radiation induced. No well-defined recurrent disease. 2. No evidence of thoracic nodal metastasis. 3. Coronary artery atherosclerosis. Aortic Atherosclerosis (ICD10-I70.0). 4.  Emphysema (ICD10-J43.9). 5.  Tiny hiatal hernia. Electronically Signed   By: Abigail Miyamoto M.D.   On: 07/11/2016 15:58    Impression/Plan: 1. Putative Stage IA, NSCLC of the left lower lobe of the lung. The patient's CT imaging was reviewed and she appears to be clinically and radiographically stable. We will proceed with repeat CT of the chest in 6 months' time. She states agreement and understanding and will contact us with questions or concerns prior to her next visit. 2. Radiographic evidence of bronchiectasis with a history of COPD. The patient continues to be followed by pulmonology and is asymptomatic at this time. We will follow this expectantly, and I've encouraged  her to use her inhalers as prescribed by Dr. Elsworth Soho as well.    Carola Rhine, PAC

## 2016-08-12 ENCOUNTER — Other Ambulatory Visit: Payer: Self-pay | Admitting: *Deleted

## 2016-08-12 MED ORDER — ALPRAZOLAM 0.5 MG PO TABS: ORAL_TABLET | ORAL | 0 refills | Status: DC

## 2016-08-13 NOTE — Telephone Encounter (Signed)
Rx called in to pharmacy. 

## 2016-08-24 ENCOUNTER — Ambulatory Visit (INDEPENDENT_AMBULATORY_CARE_PROVIDER_SITE_OTHER): Payer: Medicare Other | Admitting: Family Medicine

## 2016-08-24 ENCOUNTER — Encounter: Payer: Self-pay | Admitting: Family Medicine

## 2016-08-24 ENCOUNTER — Ambulatory Visit (INDEPENDENT_AMBULATORY_CARE_PROVIDER_SITE_OTHER): Payer: Medicare Other

## 2016-08-24 VITALS — BP 123/58 | HR 84 | Temp 98.2°F | Ht 65.0 in | Wt 123.5 lb

## 2016-08-24 DIAGNOSIS — J432 Centrilobular emphysema: Secondary | ICD-10-CM

## 2016-08-24 DIAGNOSIS — C3432 Malignant neoplasm of lower lobe, left bronchus or lung: Secondary | ICD-10-CM

## 2016-08-24 DIAGNOSIS — E785 Hyperlipidemia, unspecified: Secondary | ICD-10-CM | POA: Diagnosis not present

## 2016-08-24 DIAGNOSIS — R109 Unspecified abdominal pain: Secondary | ICD-10-CM

## 2016-08-24 MED ORDER — FUROSEMIDE 20 MG PO TABS: 20.0000 mg | ORAL_TABLET | Freq: Every day | ORAL | 2 refills | Status: DC

## 2016-08-24 MED ORDER — POTASSIUM CHLORIDE CRYS ER 10 MEQ PO TBCR: 10.0000 meq | EXTENDED_RELEASE_TABLET | Freq: Every day | ORAL | 2 refills | Status: DC

## 2016-08-24 MED ORDER — ALPRAZOLAM 0.5 MG PO TABS: ORAL_TABLET | ORAL | 2 refills | Status: DC

## 2016-08-24 MED ORDER — OXYBUTYNIN CHLORIDE 5 MG PO TABS: 5.0000 mg | ORAL_TABLET | Freq: Four times a day (QID) | ORAL | 2 refills | Status: DC

## 2016-08-24 NOTE — Patient Instructions (Signed)
Drink 64 oz of water daily.  Take Lasix every morning with you Potassium pill.

## 2016-08-24 NOTE — Progress Notes (Signed)
Subjective:  Patient ID: [name], female    DOB: [birth year]  Age: 83 y.o. MRN: [id number]  CC: Follow-up (pt here today for routine follow up of her chronic medical condtions, no other concerns voiced.)   HPI [name] presents for Dyspnea now at baseline. Continues to use oxygen regularly. she's also had some more swelling recently but had discontinued her furosemide. She would like to go back on that. She was noted to have an elevated BUN last month indicating some prerenal azotemia at the time it was discontinued.  Patient in for follow-up of elevated cholesterol. Doing well without complaints on current medication. Denies side effects of statin including myalgia and arthralgia and nausea. Also in today for liver function testing. Currently no chest pain, shortness of breath or other cardiovascular related symptoms noted.  Patient in for follow-up of GERD. Currently asymptomatic taking  PPI daily. There is no chest pain or heartburn. No hematemesis and no melena. No dysphagia or choking. Onset is remote. Progression is stable. Complicating factors, none.   Depression screen [facility] 2/9 08/24/2016 07/12/2016 05/23/2016  Decreased Interest 0 0 0  Down, Depressed, Hopeless 0 0 0  PHQ - 2 Score 0 0 0  Altered sleeping - - -  Tired, decreased energy - - -  Change in appetite - - -  Feeling bad or failure about yourself  - - -  Trouble concentrating - - -  Moving slowly or fidgety/restless - - -  Suicidal thoughts - - -  PHQ-9 Score - - -    History [name] has a past medical history of Anxiety disorder; Cancer ([place]); Colon adenoma; COPD (chronic obstructive pulmonary disease) ([place]); Depression; Fall at home (May 17, 2012); GERD (gastroesophageal reflux disease); Hyperlipidemia; Irritable bowel syndrome; Lymphocytic colitis (12/15/2011); Peptic ulcer disease; Pneumonia (2007); Pseudotumor cerebri; Pulmonary embolism ([place]) (2004); and Radiation (4/13,4/15,04/30/14).   She has a past  surgical history that includes Cholecystectomy; Colon resection; Abdominal hysterectomy; Brain surgery; Cataract extraction; Upper gastrointestinal endoscopy; Colonoscopy; Lower extremity stents; abdominal aortagram (N/A, 08/10/2012); and Insertion of iliac stent (Bilateral, 08/10/2012).   Her family history includes Cancer in her mother; Heart attack in her mother; Hyperlipidemia in her mother and sister; Hypertension in her mother; Lung cancer in her father.She reports that she has been smoking Cigarettes.  She has a 30.00 pack-year smoking history. She has never used smokeless tobacco. She reports that she does not drink alcohol or use drugs.    ROS Review of Systems  Constitutional: Negative for activity change, appetite change and fever.  HENT: Negative for congestion, rhinorrhea and sore throat.   Eyes: Negative for visual disturbance.  Respiratory: Positive for shortness of breath. Negative for cough.   Cardiovascular: Positive for leg swelling. Negative for chest pain and palpitations.  Gastrointestinal: Negative for abdominal pain, diarrhea and nausea.  Genitourinary: Negative for dysuria.  Musculoskeletal: Negative for arthralgias and myalgias.    Objective:  BP (!) 123/58   Pulse 84   Temp 98.2 F (36.8 C) (Oral)   Ht _0  (1.651 m)   Wt 123 lb 8 oz (56 kg)   BMI 20.55 kg/m   BP Readings from Last 3 Encounters:  08/24/16 (!) 123/58  07/12/16 (!) 102/59  06/02/16 (!) 150/74    Wt Readings from Last 3 Encounters:  08/24/16 123 lb 8 oz (56 kg)  07/12/16 123 lb 6.4 oz (56 kg)  06/02/16 128 lb (58.1 kg)     Physical Exam  Constitutional: She  is oriented to person, place, and time. She appears well-developed and well-nourished. No distress.  HENT:  Head: Normocephalic and atraumatic.  Right Ear: External ear normal.  Left Ear: External ear normal.  Nose: Nose normal.  Mouth/Throat: Oropharynx is clear and moist.  Eyes: Pupils are equal, round, and reactive to light.  Conjunctivae and EOM are normal.  Neck: Normal range of motion. Neck supple. No thyromegaly present.  Cardiovascular: Normal rate, regular rhythm and normal heart sounds.   No murmur heard. Pulmonary/Chest: Effort normal and breath sounds normal. No respiratory distress. She has no wheezes. She has no rales.  Abdominal: Soft. Bowel sounds are normal. She exhibits no distension. There is no tenderness.  Lymphadenopathy:    She has no cervical adenopathy.  Neurological: She is alert and oriented to person, place, and time. She has normal reflexes.  Skin: Skin is warm and dry.  Psychiatric: She has a normal mood and affect. Her behavior is normal. Judgment and thought content normal.      Assessment & Plan:   [name] was seen today for follow-up.  Diagnoses and all orders for this visit:  Centrilobular emphysema ([place]) -     DG Chest 2 View; Future  Malignant neoplasm of lower lobe of left lung ([facility]) -     CBC with Differential/Platelet -     CMP14+EGFR  Hyperlipidemia with target LDL less than 100 -     Lipid panel  Stomach pain -     Cancel: Urinalysis  Other orders -     ALPRAZolam (XANAX) 0.5 MG tablet; TAKE  (1)  TABLET TWICE A DAY. -     furosemide (LASIX) 20 MG tablet; Take 1 tablet (20 mg total) by mouth daily. -     oxybutynin (DITROPAN) 5 MG tablet; Take 1 tablet (5 mg total) by mouth 4 (four) times daily. -     potassium chloride (K-DUR,KLOR-CON) 10 MEQ tablet; Take 1 tablet (10 mEq total) by mouth daily. As a potassium supplement       I have changed Ms. Jablonski's potassium chloride. I am also having her maintain her aspirin EC, CRANBERRY PO, Vitamin D, vitamin E, budesonide-formoterol, albuterol, triamcinolone 0.1 % cream : eucerin, pantoprazole, ALPRAZolam, furosemide, and oxybutynin. We will continue to administer ferrous fumarate.  Allergies as of 08/24/2016      Reactions   Codeine Anaphylaxis   Penicillins Anaphylaxis   Sulfa Antibiotics Anaphylaxis        Medication List       Accurate as of 08/24/16 11:59 PM. Always use your most recent med list.          albuterol 108 (90 Base) MCG/ACT inhaler Commonly known as:  PROVENTIL HFA;VENTOLIN HFA Inhale 2 puffs into the lungs every 4 (four) hours as needed for wheezing or shortness of breath.   ALPRAZolam 0.5 MG tablet Commonly known as:  XANAX TAKE  (1)  TABLET TWICE A DAY.   aspirin EC 325 MG tablet Take 325 mg by mouth daily.   budesonide-formoterol 160-4.5 MCG/ACT inhaler Commonly known as:  SYMBICORT Inhale 2 puffs into the lungs 2 (two) times daily.   CRANBERRY PO Take 1 tablet by mouth daily.   furosemide 20 MG tablet Commonly known as:  LASIX Take 1 tablet (20 mg total) by mouth daily.   oxybutynin 5 MG tablet Commonly known as:  DITROPAN Take 1 tablet (5 mg total) by mouth 4 (four) times daily.   pantoprazole 40 MG tablet Commonly known as:  PROTONIX  TAKE 1 TABLET DAILY   potassium chloride 10 MEQ tablet Commonly known as:  K-DUR,KLOR-CON Take 1 tablet (10 mEq total) by mouth daily. As a potassium supplement   triamcinolone 0.1 % cream : eucerin Crea Apply 1 application topically 3 (three) times daily.   Vitamin D 2000 units tablet Take 2,000 Units by mouth daily.   vitamin E 400 UNIT capsule Take 400 Units by mouth daily.        Follow-up: Return in about 3 months (around 11/24/2016).  Claretta Fraise, M.D.

## 2016-08-25 LAB — CBC WITH DIFFERENTIAL/PLATELET
BASOS ABS: 0 10*3/uL (ref 0.0–0.2)
BASOS: 1 %
EOS (ABSOLUTE): 0.3 10*3/uL (ref 0.0–0.4)
Eos: 4 %
HEMOGLOBIN: 9.9 g/dL — AB (ref 11.1–15.9)
Hematocrit: 31.7 % — ABNORMAL LOW (ref 34.0–46.6)
IMMATURE GRANULOCYTES: 0 %
Immature Grans (Abs): 0 10*3/uL (ref 0.0–0.1)
LYMPHS: 28 %
Lymphocytes Absolute: 2.2 10*3/uL (ref 0.7–3.1)
MCH: 22 pg — AB (ref 26.6–33.0)
MCHC: 31.2 g/dL — ABNORMAL LOW (ref 31.5–35.7)
MCV: 71 fL — ABNORMAL LOW (ref 79–97)
MONOCYTES: 9 %
Monocytes Absolute: 0.7 10*3/uL (ref 0.1–0.9)
NEUTROS ABS: 4.7 10*3/uL (ref 1.4–7.0)
Neutrophils: 58 %
Platelets: 409 10*3/uL — ABNORMAL HIGH (ref 150–379)
RBC: 4.49 x10E6/uL (ref 3.77–5.28)
RDW: 19.2 % — ABNORMAL HIGH (ref 12.3–15.4)
WBC: 8 10*3/uL (ref 3.4–10.8)

## 2016-08-25 LAB — LIPID PANEL
Chol/HDL Ratio: 4 ratio (ref 0.0–4.4)
Cholesterol, Total: 189 mg/dL (ref 100–199)
HDL: 47 mg/dL (ref >39)
LDL CALC: 124 mg/dL — AB (ref 0–99)
Triglycerides: 91 mg/dL (ref 0–149)
VLDL CHOLESTEROL CAL: 18 mg/dL (ref 5–40)

## 2016-08-25 LAB — CMP14+EGFR
ALBUMIN: 3.8 g/dL (ref 3.5–4.7)
ALT: 11 IU/L (ref 0–32)
AST: 15 IU/L (ref 0–40)
Albumin/Globulin Ratio: 1.4 (ref 1.2–2.2)
Alkaline Phosphatase: 67 IU/L (ref 39–117)
BILIRUBIN TOTAL: 0.3 mg/dL (ref 0.0–1.2)
BUN / CREAT RATIO: 17 (ref 12–28)
BUN: 11 mg/dL (ref 8–27)
CALCIUM: 8.9 mg/dL (ref 8.7–10.3)
CHLORIDE: 103 mmol/L (ref 96–106)
CO2: 25 mmol/L (ref 20–29)
CREATININE: 0.64 mg/dL (ref 0.57–1.00)
GFR, EST AFRICAN AMERICAN: 96 mL/min/{1.73_m2} (ref >59)
GFR, EST NON AFRICAN AMERICAN: 83 mL/min/{1.73_m2} (ref >59)
GLUCOSE: 93 mg/dL (ref 65–99)
Globulin, Total: 2.8 g/dL (ref 1.5–4.5)
Potassium: 4.7 mmol/L (ref 3.5–5.2)
Sodium: 140 mmol/L (ref 134–144)
Total Protein: 6.6 g/dL (ref 6.0–8.5)

## 2016-09-13 ENCOUNTER — Other Ambulatory Visit: Payer: Self-pay | Admitting: Family Medicine

## 2016-09-15 NOTE — Telephone Encounter (Signed)
rx called into pharmacy

## 2016-09-22 ENCOUNTER — Ambulatory Visit: Payer: Medicare Other | Admitting: Vascular Surgery

## 2016-09-22 ENCOUNTER — Encounter (HOSPITAL_COMMUNITY): Payer: Medicare Other

## 2016-11-04 DIAGNOSIS — Z23 Encounter for immunization: Secondary | ICD-10-CM | POA: Diagnosis not present

## 2016-11-10 ENCOUNTER — Ambulatory Visit (HOSPITAL_COMMUNITY)
Admission: RE | Admit: 2016-11-10 | Discharge: 2016-11-10 | Disposition: A | Discharge status: Home / Self Care | Payer: Medicare Other | Source: Ambulatory Visit | Attending: Vascular Surgery | Admitting: Vascular Surgery

## 2016-11-10 ENCOUNTER — Encounter: Payer: Self-pay | Admitting: Vascular Surgery

## 2016-11-10 ENCOUNTER — Ambulatory Visit (INDEPENDENT_AMBULATORY_CARE_PROVIDER_SITE_OTHER): Payer: Medicare Other | Admitting: Vascular Surgery

## 2016-11-10 ENCOUNTER — Ambulatory Visit (INDEPENDENT_AMBULATORY_CARE_PROVIDER_SITE_OTHER)
Admission: RE | Admit: 2016-11-10 | Discharge: 2016-11-10 | Disposition: A | Discharge status: Home / Self Care | Payer: Medicare Other | Source: Ambulatory Visit | Attending: Vascular Surgery | Admitting: Vascular Surgery

## 2016-11-10 VITALS — BP 151/74 | HR 66 | Temp 97.5°F | Resp 20 | Ht 65.0 in | Wt 120.0 lb

## 2016-11-10 DIAGNOSIS — I6521 Occlusion and stenosis of right carotid artery: Secondary | ICD-10-CM | POA: Diagnosis not present

## 2016-11-10 DIAGNOSIS — R0989 Other specified symptoms and signs involving the circulatory and respiratory systems: Secondary | ICD-10-CM

## 2016-11-10 DIAGNOSIS — I6523 Occlusion and stenosis of bilateral carotid arteries: Secondary | ICD-10-CM | POA: Diagnosis not present

## 2016-11-10 DIAGNOSIS — I739 Peripheral vascular disease, unspecified: Secondary | ICD-10-CM | POA: Insufficient documentation

## 2016-11-10 DIAGNOSIS — F17200 Nicotine dependence, unspecified, uncomplicated: Secondary | ICD-10-CM | POA: Insufficient documentation

## 2016-11-10 LAB — VAS US CAROTID
LCCADDIAS: -21 cm/s
LCCAPDIAS: 19 cm/s
LCCAPSYS: 125 cm/s
LEFT ECA DIAS: -15 cm/s
LEFT VERTEBRAL DIAS: 16 cm/s
LICADDIAS: -27 cm/s
LICAPSYS: -71 cm/s
Left CCA dist sys: -69 cm/s
Left ICA dist sys: -83 cm/s
Left ICA prox dias: -18 cm/s
RCCAPSYS: 89 cm/s
RIGHT CCA MID DIAS: 15 cm/s
RIGHT ECA DIAS: -30 cm/s
RIGHT VERTEBRAL DIAS: -10 cm/s
Right CCA prox dias: 16 cm/s
Right cca dist sys: -74 cm/s

## 2016-11-10 NOTE — Progress Notes (Signed)
Patient is here for f/u from bilateral external iliac stenting (08/2012). Her shortness of breath usually interferes with exercise first. She is able to walk half a block before she is short of breath. She denies any rest pain at night. She currently is able to ambulate as much as she wishes. She has fairly significant pulmonary dysfunction.  She was seen back in May for leg swelling and redness has improved significantly with elevation.  Review of systems: Severely limited with shortness of breath at short distances of walking. She denies chest pain.  Current Outpatient Prescriptions on File Prior to Visit  Medication Sig Dispense Refill  . albuterol (PROVENTIL HFA;VENTOLIN HFA) 108 (90 Base) MCG/ACT inhaler Inhale 2 puffs into the lungs every 4 (four) hours as needed for wheezing or shortness of breath. 8.5 g 5  . ALPRAZolam (XANAX) 0.5 MG tablet TAKE  (1)  TABLET TWICE A DAY. 60 tablet 0  . aspirin EC 325 MG tablet Take 325 mg by mouth daily.    . budesonide-formoterol (SYMBICORT) 160-4.5 MCG/ACT inhaler Inhale 2 puffs into the lungs 2 (two) times daily.    . Cholecalciferol (VITAMIN D) 2000 UNITS tablet Take 2,000 Units by mouth daily.    Marland Kitchen CRANBERRY PO Take 1 tablet by mouth daily.    . furosemide (LASIX) 20 MG tablet Take 1 tablet (20 mg total) by mouth daily. 30 tablet 2  . oxybutynin (DITROPAN) 5 MG tablet Take 1 tablet (5 mg total) by mouth 4 (four) times daily. 120 tablet 2  . pantoprazole (PROTONIX) 40 MG tablet TAKE 1 TABLET DAILY 30 tablet 5  . potassium chloride (K-DUR,KLOR-CON) 10 MEQ tablet Take 1 tablet (10 mEq total) by mouth daily. As a potassium supplement 30 tablet 2  . Triamcinolone Acetonide (TRIAMCINOLONE 0.1 % CREAM : EUCERIN) CREA Apply 1 application topically 3 (three) times daily. 1 each 0  . vitamin E 400 UNIT capsule Take 400 Units by mouth daily.     Current Facility-Administered Medications on File Prior to Visit  Medication Dose Route Frequency Provider Last Rate  Last Dose  . ferrous fumarate (HEMOCYTE - 106 mg FE) tablet 106 mg of iron  1 tablet Oral Daily Claretta Fraise, MD         Physical exam:  Vitals:   11/10/16 1420 11/10/16 1424  BP: (!) 152/72 (!) 151/74  Pulse: 66   Resp: 20   Temp: (!) 97.5 F (36.4 C)   TempSrc: Oral   SpO2: 97%   Weight: 120 lb (54.4 kg)   Height: 5\' 5"  (1.651 m)     Neck: Left carotid bruit no right carotid bruit. Chest: Distant wheezing breath sounds  Cardiac: Regular rate and rhythm without murmur  Abdomen: Soft nontender  Extremities: No palpable pedal pulses  Data: Patient had a carotid duplex scan today which showed 40-60% right internal carotid artery stenosis less than 40% left internal carotid artery stenosis. She also had bilateral ABIs which are 0.83 on the left 0.71 on the right essentially unchanged from last exam.  Assessment: Bilateral lower extremity peripheral arterial disease not really symptomatic more limited by her COPD. Asymptomatic moderate carotid stenosis.  Plan: Patient will follow-up in 6 months with repeat ABIs and carotid duplex scan. She will continue her daily aspirin.  Ruta Hinds, MD [facility] Office: [phone] Pager: [phone]

## 2016-11-24 NOTE — Addendum Note (Signed)
Addended by: Lianne Cure A on: 11/24/2016 10:37 AM   Modules accepted: Orders

## 2016-11-25 ENCOUNTER — Ambulatory Visit: Payer: Medicare Other | Admitting: Family Medicine

## 2016-12-26 ENCOUNTER — Telehealth: Payer: Self-pay | Admitting: *Deleted

## 2016-12-26 NOTE — Telephone Encounter (Signed)
Called patient to inform of labs and CT for 01-13-17, and her fu on 01-16-17 for results, lvm for a return call

## 2017-01-13 ENCOUNTER — Ambulatory Visit (HOSPITAL_COMMUNITY): Admission: RE | Admit: 2017-01-13 | Payer: Medicare Other | Source: Ambulatory Visit

## 2017-01-13 ENCOUNTER — Ambulatory Visit: Payer: Medicare Other | Attending: Radiation Oncology

## 2017-01-16 ENCOUNTER — Telehealth: Payer: Self-pay | Admitting: *Deleted

## 2017-01-16 ENCOUNTER — Inpatient Hospital Stay
Admission: RE | Admit: 2017-01-16 | Discharge: 2017-01-16 | Disposition: A | Discharge status: Home / Self Care | Payer: Self-pay | Source: Ambulatory Visit | Attending: Radiation Oncology | Admitting: Radiation Oncology

## 2017-01-16 NOTE — Telephone Encounter (Signed)
Called patient to inform not to come today due to missing CT on 01-13-17, lvm for in-put so I can reschedule CT and fu

## 2017-01-30 ENCOUNTER — Other Ambulatory Visit: Payer: Self-pay | Admitting: Family Medicine

## 2017-02-03 ENCOUNTER — Other Ambulatory Visit: Payer: Self-pay | Admitting: Family Medicine

## 2017-02-14 ENCOUNTER — Ambulatory Visit (INDEPENDENT_AMBULATORY_CARE_PROVIDER_SITE_OTHER): Payer: Medicare Other | Admitting: Family Medicine

## 2017-02-14 ENCOUNTER — Encounter: Payer: Self-pay | Admitting: Family Medicine

## 2017-02-14 DIAGNOSIS — K219 Gastro-esophageal reflux disease without esophagitis: Secondary | ICD-10-CM | POA: Diagnosis not present

## 2017-02-14 MED ORDER — FUROSEMIDE 20 MG PO TABS: 20.0000 mg | ORAL_TABLET | Freq: Every day | ORAL | 5 refills | Status: DC

## 2017-02-14 MED ORDER — POTASSIUM CHLORIDE CRYS ER 10 MEQ PO TBCR: 10.0000 meq | EXTENDED_RELEASE_TABLET | Freq: Every day | ORAL | 5 refills | Status: DC

## 2017-02-14 MED ORDER — ALPRAZOLAM 0.5 MG PO TABS: ORAL_TABLET | ORAL | 0 refills | Status: DC

## 2017-02-14 MED ORDER — PANTOPRAZOLE SODIUM 40 MG PO TBEC: 40.0000 mg | DELAYED_RELEASE_TABLET | Freq: Every day | ORAL | 5 refills | Status: DC

## 2017-02-14 MED ORDER — OXYBUTYNIN CHLORIDE 5 MG PO TABS: 5.0000 mg | ORAL_TABLET | Freq: Four times a day (QID) | ORAL | 2 refills | Status: DC

## 2017-02-14 NOTE — Progress Notes (Signed)
Subjective:  Patient ID: [name], female    DOB: 19-Sep-1932  Age: 84 y.o. MRN: [id number]  CC: Follow-up (pt here today for routine follow up of her chronic medical conditions )   HPI [name] presents for follow-up on her emphysema and anxiety.  She has been breathing better.  She is currently using Symbicort 2 puffs twice daily.  Additionally she is taking Lasix and potassium to help with swelling.  Currently her anxiety is active.  Her daughter was recently diagnosed with cancer of the pancreas.  She is an only child of the patient.  Patient has been very worried distraught and preoccupied with this "every waking moment".  Patient is tearful when discussing this.  It has caused her to become agitated and loses focus and have difficulty concentrating.  Depression screen [facility] 2/9 02/14/2017 08/24/2016 07/12/2016  Decreased Interest 0 0 0  Down, Depressed, Hopeless 0 0 0  PHQ - 2 Score 0 0 0  Altered sleeping - - -  Tired, decreased energy - - -  Change in appetite - - -  Feeling bad or failure about yourself  - - -  Trouble concentrating - - -  Moving slowly or fidgety/restless - - -  Suicidal thoughts - - -  PHQ-9 Score - - -    History Kelena has a past medical history of Anxiety disorder, Cancer ([place]), Colon adenoma, COPD (chronic obstructive pulmonary disease) ([place]), Depression, Fall at home (May 17, 2012), GERD (gastroesophageal reflux disease), Hyperlipidemia, Irritable bowel syndrome, Lymphocytic colitis (12/15/2011), Peptic ulcer disease, Pneumonia (2007), Pseudotumor cerebri, Pulmonary embolism ([place]) (2004), and Radiation (4/13,4/15,04/30/14).   She has a past surgical history that includes Cholecystectomy; Colon resection; Abdominal hysterectomy; Brain surgery; Cataract extraction; Upper gastrointestinal endoscopy; Colonoscopy; Lower extremity stents; abdominal aortagram (N/A, 08/10/2012); and Insertion of iliac stent (Bilateral, 08/10/2012).   Her family history includes  Cancer in her mother; Heart attack in her mother; Hyperlipidemia in her mother and sister; Hypertension in her mother; Lung cancer in her father.She reports that she has been smoking cigarettes.  She has a 30.00 pack-year smoking history. she has never used smokeless tobacco. She reports that she does not drink alcohol or use drugs.    ROS Review of Systems  Constitutional: Negative for activity change, appetite change and fever.  HENT: Negative for congestion, rhinorrhea and sore throat.   Eyes: Negative for visual disturbance.  Respiratory: Negative for cough and shortness of breath.   Cardiovascular: Negative for chest pain and palpitations.  Gastrointestinal: Negative for abdominal pain, diarrhea and nausea.  Genitourinary: Negative for dysuria.  Musculoskeletal: Negative for arthralgias and myalgias.  Psychiatric/Behavioral: Positive for agitation and dysphoric mood. The patient is nervous/anxious.     Objective:  BP 139/77   Pulse 71   Temp 98.7 F (37.1 C) (Oral)   Ht '5\' 5"'  (1.651 m)   Wt 115 lb (52.2 kg)   BMI 19.14 kg/m   BP Readings from Last 3 Encounters:  02/14/17 139/77  11/10/16 (!) 151/74  08/24/16 (!) 123/58    Wt Readings from Last 3 Encounters:  02/14/17 115 lb (52.2 kg)  11/10/16 120 lb (54.4 kg)  08/24/16 123 lb 8 oz (56 kg)     Physical Exam  Constitutional: She is oriented to person, place, and time. She appears well-developed and well-nourished. No distress.  HENT:  Head: Normocephalic and atraumatic.  Eyes: Conjunctivae are normal. Pupils are equal, round, and reactive to light.  Neck: Normal range of  motion. Neck supple. No thyromegaly present.  Cardiovascular: Normal rate, regular rhythm and normal heart sounds.  No murmur heard. Pulmonary/Chest: Effort normal and breath sounds normal. No respiratory distress. She has no wheezes. She has no rales.  Abdominal: Soft. Bowel sounds are normal. She exhibits no distension. There is no tenderness.    Musculoskeletal: Normal range of motion.  Lymphadenopathy:    She has no cervical adenopathy.  Neurological: She is alert and oriented to person, place, and time.  Skin: Skin is warm and dry.  Psychiatric: Her speech is normal and behavior is normal. Thought content normal. Her mood appears anxious. Her affect is angry. Cognition and memory are normal. She expresses impulsivity.      Assessment & Plan:   Immaculate was seen today for follow-up.  Diagnoses and all orders for this visit:  Gastroesophageal reflux disease, esophagitis presence not specified -     CBC with Differential/Platelet -     CMP14+EGFR -     pantoprazole (PROTONIX) 40 MG tablet; Take 1 tablet (40 mg total) by mouth daily.  Other orders -     furosemide (LASIX) 20 MG tablet; Take 1 tablet (20 mg total) by mouth daily. -     oxybutynin (DITROPAN) 5 MG tablet; Take 1 tablet (5 mg total) by mouth 4 (four) times daily. -     potassium chloride (K-DUR,KLOR-CON) 10 MEQ tablet; Take 1 tablet (10 mEq total) by mouth daily. As a potassium supplement -     ALPRAZolam (XANAX) 0.5 MG tablet; TAKE  (1)  TABLET TWICE A DAY.       I have changed [name]. Tep's pantoprazole. I am also having her maintain her aspirin EC, CRANBERRY PO, Vitamin D, vitamin E, budesonide-formoterol, albuterol, triamcinolone 0.1 % cream : eucerin, furosemide, oxybutynin, potassium chloride, and ALPRAZolam. We will continue to administer ferrous fumarate.  Allergies as of 02/14/2017      Reactions   Codeine Anaphylaxis   Penicillins Anaphylaxis   Sulfa Antibiotics Anaphylaxis      Medication List        Accurate as of 02/14/17  6:04 PM. Always use your most recent med list.          albuterol 108 (90 Base) MCG/ACT inhaler Commonly known as:  PROVENTIL HFA;VENTOLIN HFA Inhale 2 puffs into the lungs every 4 (four) hours as needed for wheezing or shortness of breath.   ALPRAZolam 0.5 MG tablet Commonly known as:  XANAX TAKE  (1)  TABLET  TWICE A DAY.   aspirin EC 325 MG tablet Take 325 mg by mouth daily.   budesonide-formoterol 160-4.5 MCG/ACT inhaler Commonly known as:  SYMBICORT Inhale 2 puffs into the lungs 2 (two) times daily.   CRANBERRY PO Take 1 tablet by mouth daily.   furosemide 20 MG tablet Commonly known as:  LASIX Take 1 tablet (20 mg total) by mouth daily.   oxybutynin 5 MG tablet Commonly known as:  DITROPAN Take 1 tablet (5 mg total) by mouth 4 (four) times daily.   pantoprazole 40 MG tablet Commonly known as:  PROTONIX Take 1 tablet (40 mg total) by mouth daily.   potassium chloride 10 MEQ tablet Commonly known as:  K-DUR,KLOR-CON Take 1 tablet (10 mEq total) by mouth daily. As a potassium supplement   triamcinolone 0.1 % cream : eucerin Crea Apply 1 application topically 3 (three) times daily.   Vitamin D 2000 units tablet Take 2,000 Units by mouth daily.   vitamin E 400 UNIT capsule  Take 400 Units by mouth daily.        Follow-up: Return in about 6 months (around 08/14/2017), or if symptoms worsen or fail to improve.  Claretta Fraise, M.D.

## 2017-02-15 LAB — CMP14+EGFR
A/G RATIO: 1.5 (ref 1.2–2.2)
ALT: 7 IU/L (ref 0–32)
AST: 13 IU/L (ref 0–40)
Albumin: 4.2 g/dL (ref 3.5–4.7)
Alkaline Phosphatase: 71 IU/L (ref 39–117)
BUN/Creatinine Ratio: 13 (ref 12–28)
BUN: 10 mg/dL (ref 8–27)
Bilirubin Total: 0.3 mg/dL (ref 0.0–1.2)
CALCIUM: 9.2 mg/dL (ref 8.7–10.3)
CO2: 22 mmol/L (ref 20–29)
Chloride: 102 mmol/L (ref 96–106)
Creatinine, Ser: 0.76 mg/dL (ref 0.57–1.00)
GFR, EST AFRICAN AMERICAN: 84 mL/min/{1.73_m2} (ref >59)
GFR, EST NON AFRICAN AMERICAN: 73 mL/min/{1.73_m2} (ref >59)
Globulin, Total: 2.8 g/dL (ref 1.5–4.5)
Glucose: 84 mg/dL (ref 65–99)
POTASSIUM: 4.1 mmol/L (ref 3.5–5.2)
Sodium: 139 mmol/L (ref 134–144)
TOTAL PROTEIN: 7 g/dL (ref 6.0–8.5)

## 2017-02-15 LAB — CBC WITH DIFFERENTIAL/PLATELET
BASOS: 1 %
Basophils Absolute: 0.1 10*3/uL (ref 0.0–0.2)
EOS (ABSOLUTE): 0.4 10*3/uL (ref 0.0–0.4)
EOS: 5 %
Hematocrit: 33.7 % — ABNORMAL LOW (ref 34.0–46.6)
Hemoglobin: 10.6 g/dL — ABNORMAL LOW (ref 11.1–15.9)
IMMATURE GRANS (ABS): 0 10*3/uL (ref 0.0–0.1)
IMMATURE GRANULOCYTES: 0 %
LYMPHS: 24 %
Lymphocytes Absolute: 2.1 10*3/uL (ref 0.7–3.1)
MCH: 22.1 pg — ABNORMAL LOW (ref 26.6–33.0)
MCHC: 31.5 g/dL (ref 31.5–35.7)
MCV: 70 fL — AB (ref 79–97)
MONOCYTES: 7 %
Monocytes Absolute: 0.6 10*3/uL (ref 0.1–0.9)
NEUTROS PCT: 63 %
Neutrophils Absolute: 5.5 10*3/uL (ref 1.4–7.0)
PLATELETS: 454 10*3/uL — AB (ref 150–379)
RBC: 4.79 x10E6/uL (ref 3.77–5.28)
RDW: 17.5 % — ABNORMAL HIGH (ref 12.3–15.4)
WBC: 8.8 10*3/uL (ref 3.4–10.8)

## 2017-03-21 ENCOUNTER — Other Ambulatory Visit: Payer: Self-pay | Admitting: Family Medicine

## 2017-05-11 ENCOUNTER — Ambulatory Visit (INDEPENDENT_AMBULATORY_CARE_PROVIDER_SITE_OTHER): Payer: Medicare Other | Admitting: Vascular Surgery

## 2017-05-11 ENCOUNTER — Encounter: Payer: Self-pay | Admitting: Vascular Surgery

## 2017-05-11 ENCOUNTER — Encounter (HOSPITAL_COMMUNITY): Payer: Medicare Other

## 2017-05-11 ENCOUNTER — Ambulatory Visit (HOSPITAL_COMMUNITY)
Admission: RE | Admit: 2017-05-11 | Discharge: 2017-05-11 | Disposition: A | Discharge status: Home / Self Care | Payer: Medicare Other | Source: Ambulatory Visit | Attending: Vascular Surgery | Admitting: Vascular Surgery

## 2017-05-11 ENCOUNTER — Other Ambulatory Visit: Payer: Self-pay

## 2017-05-11 ENCOUNTER — Ambulatory Visit: Payer: Medicare Other | Admitting: Family

## 2017-05-11 ENCOUNTER — Ambulatory Visit (INDEPENDENT_AMBULATORY_CARE_PROVIDER_SITE_OTHER)
Admission: RE | Admit: 2017-05-11 | Discharge: 2017-05-11 | Disposition: A | Discharge status: Home / Self Care | Payer: Medicare Other | Source: Ambulatory Visit | Attending: Vascular Surgery | Admitting: Vascular Surgery

## 2017-05-11 VITALS — BP 135/70 | HR 73 | Temp 98.4°F | Resp 16 | Ht 65.0 in | Wt 114.0 lb

## 2017-05-11 DIAGNOSIS — I739 Peripheral vascular disease, unspecified: Secondary | ICD-10-CM | POA: Insufficient documentation

## 2017-05-11 DIAGNOSIS — I6523 Occlusion and stenosis of bilateral carotid arteries: Secondary | ICD-10-CM | POA: Diagnosis not present

## 2017-05-11 DIAGNOSIS — I6521 Occlusion and stenosis of right carotid artery: Secondary | ICD-10-CM

## 2017-05-11 DIAGNOSIS — F17200 Nicotine dependence, unspecified, uncomplicated: Secondary | ICD-10-CM | POA: Insufficient documentation

## 2017-05-11 NOTE — Progress Notes (Signed)
History of Present Illness:  Patient is a 84 y.o. year old female who presents for evaluation of asymptomatic carotid stenosis, as well as f/u for bilateral external iliac stenting (08/2012).      The patient denies symptoms of TIA, amaurosis, or stroke.  The patient is currently on 325 mg aspirin antiplatelet therapy.  The carotid stenosis was found in the office in 2017 secondary to carotid bruit on exam.  Her most recent carotid duplex revealed 40-60% right internal carotid artery stenosis less than 40% left internal carotid artery stenosis    She denise symptoms of claudication and states her ambulation distance is secondary to SOB and history of COPD.  She denise rest pain and non healing LE wounds.  Her previous ABI's were0.83 on the left 0.71 on the right     Other medical problems include hyperlipidemia managed currently on low fat diet and followed by her primary care.      Past Medical History:  Diagnosis Date  . Anxiety disorder   . Cancer ([place])   . Colon adenoma   . COPD (chronic obstructive pulmonary disease) ([place])   . Depression   . Fall at home May 17, 2012   Pt. fell in tub today  . GERD (gastroesophageal reflux disease)   . Hyperlipidemia   . Irritable bowel syndrome   . Lymphocytic colitis 12/15/2011  . Peptic ulcer disease   . Pneumonia 2007   prolonged ICU stay on vent with trach  . Pseudotumor cerebri   . Pulmonary embolism ([place]) 2004   off anticoagulation  . Radiation 4/13,4/15,04/30/14   Left lower lung    Past Surgical History:  Procedure Laterality Date  . ABDOMINAL AORTAGRAM N/A 08/10/2012   Procedure: ABDOMINAL Maxcine Ham;  Surgeon: Elam Dutch, MD;  Location[facility] CATH LAB;  Service: Cardiovascular;  Laterality: N/A;  . ABDOMINAL HYSTERECTOMY    . BRAIN SURGERY     10 YEARS AGO   . CATARACT EXTRACTION     BOTH EYES  . CHOLECYSTECTOMY    . COLON RESECTION    . COLONOSCOPY    . INSERTION OF ILIAC STENT Bilateral 08/10/2012   Procedure:  INSERTION OF ILIAC STENT;  Surgeon: Elam Dutch, MD;  Location[facility] CATH LAB;  Service: Cardiovascular;  Laterality: Bilateral;  . Lower extremity stents     Per Dr. Oneida Alar  . UPPER GASTROINTESTINAL ENDOSCOPY       Social History Social History   Tobacco Use  . Smoking status: Current Every Day Smoker    Packs/day: 0.50    Years: 60.00    Pack years: 30.00    Types: Cigarettes  . Smokeless tobacco: Never Used  . Tobacco comment: 2/3 pks per day  Substance Use Topics  . Alcohol use: No    Alcohol/week: 0.0 oz  . Drug use: No    Family History Family History  Problem Relation Age of Onset  . Lung cancer Father   . Cancer Mother   . Hyperlipidemia Mother   . Hypertension Mother   . Heart attack Mother   . Hyperlipidemia Sister   . Colon cancer Neg Hx     Allergies  Allergies  Allergen Reactions  . Codeine Anaphylaxis  . Penicillins Anaphylaxis  . Sulfa Antibiotics Anaphylaxis     Current Outpatient Medications  Medication Sig Dispense Refill  . albuterol (PROVENTIL HFA;VENTOLIN HFA) 108 (90 Base) MCG/ACT inhaler Inhale 2 puffs into the lungs every 4 (four) hours as needed for  wheezing or shortness of breath. 8.5 g 5  . ALPRAZolam (XANAX) 0.5 MG tablet TAKE (1) TABLET TWICE A DAY. 60 tablet 4  . aspirin EC 325 MG tablet Take 325 mg by mouth daily.    . budesonide-formoterol (SYMBICORT) 160-4.5 MCG/ACT inhaler Inhale 2 puffs into the lungs 2 (two) times daily.    . Cholecalciferol (VITAMIN D) 2000 UNITS tablet Take 2,000 Units by mouth daily.    Marland Kitchen CRANBERRY PO Take 1 tablet by mouth daily.    . furosemide (LASIX) 20 MG tablet Take 1 tablet (20 mg total) by mouth daily. 30 tablet 5  . oxybutynin (DITROPAN) 5 MG tablet Take 1 tablet (5 mg total) by mouth 4 (four) times daily. 120 tablet 2  . pantoprazole (PROTONIX) 40 MG tablet Take 1 tablet (40 mg total) by mouth daily. 30 tablet 5  . potassium chloride (K-DUR,KLOR-CON) 10 MEQ tablet Take 1 tablet (10 mEq total)  by mouth daily. As a potassium supplement 30 tablet 5  . Triamcinolone Acetonide (TRIAMCINOLONE 0.1 % CREAM : EUCERIN) CREA Apply 1 application topically 3 (three) times daily. 1 each 0  . vitamin E 400 UNIT capsule Take 400 Units by mouth daily.     Current Facility-Administered Medications  Medication Dose Route Frequency Provider Last Rate Last Dose  . ferrous fumarate (HEMOCYTE - 106 mg FE) tablet 106 mg of iron  1 tablet Oral Daily Stacks, Cletus Gash, MD        ROS:   General:  No weight loss, Fever, chills  HEENT: No recent headaches, no nasal bleeding, no visual changes, no sore throat  Neurologic: No dizziness, blackouts, seizures. No recent symptoms of stroke or mini- stroke. No recent episodes of slurred speech, or temporary blindness.  Cardiac: No recent episodes of chest pain/pressure, no shortness of breath at rest.  No shortness of breath with exertion.  Denies history of atrial fibrillation or irregular heartbeat  Vascular: No history of rest pain in feet.  No history of claudication.  No history of non-healing ulcer, No history of DVT   Pulmonary: No home oxygen, no productive cough, no hemoptysis,  No asthma or wheezing  Musculoskeletal:  [ ]  Arthritis, [ ]  Low back pain,  [ ]  Joint pain  Hematologic:No history of hypercoagulable state.  No history of easy bleeding.  No history of anemia  Gastrointestinal: No hematochezia or melena,  No gastroesophageal reflux, no trouble swallowing  Urinary: [ ]  chronic Kidney disease, [ ]  on HD - [ ]  MWF or [ ]  TTHS, [ ]  Burning with urination, [ ]  Frequent urination, [ ]  Difficulty urinating;   Skin: No rashes  Psychological: positive history of anxiety,  positive history of depression   Physical Examination  Vitals:   05/11/17 1335  BP: 135/70  Pulse: 73  Resp: 16  Temp: 98.4 F (36.9 C)  TempSrc: Oral  SpO2: 96%  Weight: 114 lb (51.7 kg)  Height: 5\' 5"  (1.651 m)    Body mass index is 18.97 kg/m.  General:  Alert  and oriented, no acute distress HEENT: Normal Neck: positive B bruits or JVD Pulmonary: Clear to auscultation with decreased breath sounds at bases bilaterally Cardiac: Regular Rate and Rhythm with murmur Gastrointestinal: Soft, non-tender, non-distended, no mass, no scars Skin: No rash Extremity Pulses:  2+ radial, brachial, femoral pulses bilaterally, non palpable dorsalis pedis, posterior tibial B. Musculoskeletal: Positive B LE edema  Neurologic: Upper and lower extremity motor grossly intact and symmetric  DATA:  ABI's Right 67 %  TBI 39 Left 64 % TBI 36  Carotid duplex Right 40-59% Left < 39%   ASSESSMENT:  PAD s/p B external iliac stents in 2014 and known right SFA occlusion Asymptomatic carotid stenosis   PLAN: She denise any lifestyle limiting changes since her last visit.  He ABI's have decreased slightly since her last visit, but she remain asymptomatic.  Her carotid stenosis was found on exam a year ago and is unchanged from her last visit.  She continues to smoke daily.    We encouraged her to walk daily as much as her COPD will allow.  Elevation of B LE for edema when at rest.  She will follow up in 6 months for repeat ABI's and carotid duplex.  If she develops rest pain or a non healing wounds she will call sooner.  Roxy Horseman PA-C [facility]  MD in clinic: Dr. Oneida Alar  Exam details as above.  Patient more limited by her underlying poor lung function and claudication type symptoms.  She has asymptomatic bilateral moderate carotid stenosis.  She will return for follow-up in 6 months time with repeat ABIs and carotid duplex at that point.  She will return sooner if she develops any worsening symptoms in her legs or symptoms of TIA amaurosis or stroke.  Ruta Hinds, MD [facility] [place] Office: [phone] Pager: [phone]

## 2017-06-22 ENCOUNTER — Other Ambulatory Visit: Payer: Self-pay

## 2017-06-22 DIAGNOSIS — I6523 Occlusion and stenosis of bilateral carotid arteries: Secondary | ICD-10-CM

## 2017-06-22 DIAGNOSIS — I739 Peripheral vascular disease, unspecified: Secondary | ICD-10-CM

## 2017-07-03 ENCOUNTER — Telehealth: Payer: Self-pay | Admitting: *Deleted

## 2017-07-03 NOTE — Telephone Encounter (Signed)
CALLED PATIENT TO INFORM OF STAT LABS ON 07-05-17 - ARRIVAL TIME - 12 PM @ [place] HER CT ON 07-05-17 - ARRIVAL TIME - 1:15 PM , PATIENT TO HAVE WATER ONLY - 4 HRS. PRIOR TO TEST, TEST TO BE @ [facility], SPOKE WITH PATIENT'S NEIGHBOR- RUTH DUGGINS AND SHE IS AWARE OF THESE APPTS

## 2017-07-05 ENCOUNTER — Ambulatory Visit (HOSPITAL_COMMUNITY): Payer: Medicare Other

## 2017-07-05 ENCOUNTER — Ambulatory Visit: Payer: Medicare Other

## 2017-07-05 ENCOUNTER — Other Ambulatory Visit: Payer: Self-pay | Admitting: *Deleted

## 2017-07-05 DIAGNOSIS — C3432 Malignant neoplasm of lower lobe, left bronchus or lung: Secondary | ICD-10-CM

## 2017-07-06 ENCOUNTER — Ambulatory Visit
Admission: RE | Admit: 2017-07-06 | Discharge: 2017-07-06 | Disposition: A | Discharge status: Home / Self Care | Payer: Medicare Other | Source: Ambulatory Visit | Attending: Radiation Oncology | Admitting: Radiation Oncology

## 2017-08-14 ENCOUNTER — Ambulatory Visit: Payer: Medicare Other | Admitting: Family Medicine

## 2017-08-15 ENCOUNTER — Encounter: Payer: Self-pay | Admitting: Family Medicine

## 2017-08-22 ENCOUNTER — Telehealth: Payer: Self-pay | Admitting: Family Medicine

## 2017-09-05 ENCOUNTER — Encounter: Payer: Self-pay | Admitting: Family

## 2017-09-05 ENCOUNTER — Ambulatory Visit (INDEPENDENT_AMBULATORY_CARE_PROVIDER_SITE_OTHER): Payer: Medicare Other | Admitting: Family

## 2017-09-05 ENCOUNTER — Ambulatory Visit: Payer: Medicare Other | Admitting: Family Medicine

## 2017-09-05 VITALS — BP 136/67 | HR 85 | Temp 98.3°F | Ht 65.0 in

## 2017-09-05 DIAGNOSIS — R4182 Altered mental status, unspecified: Secondary | ICD-10-CM | POA: Diagnosis not present

## 2017-09-05 DIAGNOSIS — I6523 Occlusion and stenosis of bilateral carotid arteries: Secondary | ICD-10-CM | POA: Diagnosis not present

## 2017-09-05 DIAGNOSIS — R300 Dysuria: Secondary | ICD-10-CM

## 2017-09-05 DIAGNOSIS — N3001 Acute cystitis with hematuria: Secondary | ICD-10-CM | POA: Diagnosis not present

## 2017-09-05 DIAGNOSIS — C3432 Malignant neoplasm of lower lobe, left bronchus or lung: Secondary | ICD-10-CM | POA: Diagnosis not present

## 2017-09-05 DIAGNOSIS — F17200 Nicotine dependence, unspecified, uncomplicated: Secondary | ICD-10-CM | POA: Diagnosis not present

## 2017-09-05 LAB — CMP14+EGFR
A/G RATIO: 1.2 (ref 1.2–2.2)
ALBUMIN: 3.7 g/dL (ref 3.5–4.7)
ALT: 12 IU/L (ref 0–32)
AST: 21 IU/L (ref 0–40)
Alkaline Phosphatase: 64 IU/L (ref 39–117)
BUN / CREAT RATIO: 14 (ref 12–28)
BUN: 11 mg/dL (ref 8–27)
Bilirubin Total: 0.4 mg/dL (ref 0.0–1.2)
CALCIUM: 9.1 mg/dL (ref 8.7–10.3)
CO2: 24 mmol/L (ref 20–29)
Chloride: 95 mmol/L — ABNORMAL LOW (ref 96–106)
Creatinine, Ser: 0.8 mg/dL (ref 0.57–1.00)
GFR, EST AFRICAN AMERICAN: 79 mL/min/{1.73_m2} (ref >59)
GFR, EST NON AFRICAN AMERICAN: 68 mL/min/{1.73_m2} (ref >59)
GLOBULIN, TOTAL: 3.2 g/dL (ref 1.5–4.5)
Glucose: 103 mg/dL — ABNORMAL HIGH (ref 65–99)
POTASSIUM: 3.7 mmol/L (ref 3.5–5.2)
SODIUM: 135 mmol/L (ref 134–144)
TOTAL PROTEIN: 6.9 g/dL (ref 6.0–8.5)

## 2017-09-05 LAB — CBC WITH DIFFERENTIAL/PLATELET
BASOS: 1 %
Basophils Absolute: 0.1 10*3/uL (ref 0.0–0.2)
EOS (ABSOLUTE): 0.4 10*3/uL (ref 0.0–0.4)
Eos: 6 %
Hematocrit: 37 % (ref 34.0–46.6)
Hemoglobin: 11.6 g/dL (ref 11.1–15.9)
IMMATURE GRANS (ABS): 0 10*3/uL (ref 0.0–0.1)
Immature Granulocytes: 1 %
LYMPHS ABS: 1.4 10*3/uL (ref 0.7–3.1)
LYMPHS: 22 %
MCH: 22.7 pg — AB (ref 26.6–33.0)
MCHC: 31.4 g/dL — ABNORMAL LOW (ref 31.5–35.7)
MCV: 72 fL — AB (ref 79–97)
Monocytes Absolute: 0.3 10*3/uL (ref 0.1–0.9)
Monocytes: 5 %
NEUTROS ABS: 4.1 10*3/uL (ref 1.4–7.0)
Neutrophils: 65 %
PLATELETS: 476 10*3/uL — AB (ref 150–450)
RBC: 5.11 x10E6/uL (ref 3.77–5.28)
RDW: 15.9 % — ABNORMAL HIGH (ref 12.3–15.4)
WBC: 6.3 10*3/uL (ref 3.4–10.8)

## 2017-09-05 LAB — URINALYSIS, COMPLETE
Bilirubin, UA: NEGATIVE
Glucose, UA: NEGATIVE
Ketones, UA: NEGATIVE
Nitrite, UA: POSITIVE — AB
PH UA: 5.5 (ref 5.0–7.5)
Specific Gravity, UA: >1.03 — ABNORMAL HIGH (ref 1.005–1.030)
Urobilinogen, Ur: 1 mg/dL (ref 0.2–1.0)

## 2017-09-05 LAB — MICROSCOPIC EXAMINATION: RENAL EPITHEL UA: NONE SEEN /HPF

## 2017-09-05 MED ORDER — CIPROFLOXACIN HCL 500 MG PO TABS: 500.0000 mg | ORAL_TABLET | Freq: Two times a day (BID) | ORAL | 0 refills | Status: DC

## 2017-09-05 NOTE — Progress Notes (Addendum)
   Subjective:    Patient ID: [name], female    DOB: [birth year], 84 y.o.   MRN: [id number]  Chief Complaint  Patient presents with  . Urinary Tract Infection  . Memory decline   PT presents to the office today with daughter with alter mental status changes over the last 8 weeks. She states her mother had been living by herself and caring for herself, but is now falling, getting confused, and not being able to care for herself at home. She has lung cancer, but has not followed up with Oncologists in a year per patient's wishes.  Urinary Frequency   This is a new problem. The current episode started more than 1 month ago. The patient is experiencing no pain. Associated symptoms include frequency. Pertinent negatives include no flank pain, hematuria, nausea or vomiting.      Review of Systems  Respiratory: Positive for shortness of breath.   Cardiovascular: Positive for leg swelling.  Gastrointestinal: Negative for nausea and vomiting.  Genitourinary: Positive for frequency. Negative for flank pain and hematuria.  All other systems reviewed and are negative.      Objective:   Physical Exam  Constitutional: She appears well-developed and well-nourished. No distress.  HENT:  Head: Normocephalic and atraumatic.  Eyes: Pupils are equal, round, and reactive to light.  Neck: Normal range of motion. Neck supple. No thyromegaly present.  Cardiovascular: Normal rate, regular rhythm and intact distal pulses.  Murmur heard. Pulmonary/Chest: Effort normal. No respiratory distress. She has no wheezes. She has rhonchi.  Abdominal: Soft. Bowel sounds are normal. She exhibits no distension. There is no tenderness.  Musculoskeletal: Normal range of motion. She exhibits edema (2+ BLE). She exhibits no tenderness.  Generalized weakness  Neurological: She is alert. She has normal reflexes. No cranial nerve deficit.  Skin: Skin is warm and dry. There is pallor.  Psychiatric: She has a normal  mood and affect. Her behavior is normal. Judgment and thought content normal.  Vitals reviewed.   BP 136/67   Pulse 85   Temp 98.3 F (36.8 C) (Oral)   Ht '5\' 5"'$  (1.651 m)   BMI 18.97 kg/m      Assessment & Plan:  [name] comes in today with chief complaint of Urinary Tract Infection and Memory decline   Diagnosis and orders addressed:  1. Dysuria - Urinalysis, Complete - Urine Culture - CMP14+EGFR - CBC with Differential/Platelet  2. Acute cystitis with hematuria Force fluids RTO 10-14 days to recheck mental status Culture pending - CMP14+EGFR - CBC with Differential/Platelet - ciprofloxacin (CIPRO) 500 MG tablet; Take 1 tablet (500 mg total) by mouth 2 (two) times daily.  Dispense: 14 tablet; Refill: 0  3. Altered mental status, unspecified altered mental status type Labs pending - CMP14+EGFR - CBC with Differential/Platelet    4. Malignant neoplasm of lower lobe of left lung ([place])  5. Current smoker Smoking cessation   Evelina Dun, FNP

## 2017-09-05 NOTE — Patient Instructions (Signed)

## 2017-09-08 LAB — URINE CULTURE

## 2017-09-12 ENCOUNTER — Telehealth: Payer: Self-pay | Admitting: Family Medicine

## 2017-09-12 NOTE — Telephone Encounter (Signed)
Patient aware [employer] is working on form.

## 2017-09-13 NOTE — Telephone Encounter (Signed)
Cathy aware FL2 done Faxed to [place] copy left up front for family

## 2017-09-19 ENCOUNTER — Encounter: Payer: Self-pay | Admitting: Family Medicine

## 2017-09-19 ENCOUNTER — Ambulatory Visit (INDEPENDENT_AMBULATORY_CARE_PROVIDER_SITE_OTHER): Payer: Medicare Other | Admitting: Family Medicine

## 2017-09-19 VITALS — BP 139/68 | HR 90 | Temp 98.6°F | Ht 65.0 in | Wt 112.0 lb

## 2017-09-19 DIAGNOSIS — B372 Candidiasis of skin and nail: Secondary | ICD-10-CM | POA: Diagnosis not present

## 2017-09-19 DIAGNOSIS — J441 Chronic obstructive pulmonary disease with (acute) exacerbation: Secondary | ICD-10-CM

## 2017-09-19 MED ORDER — PREDNISONE 20 MG PO TABS: 40.0000 mg | ORAL_TABLET | Freq: Every day | ORAL | 0 refills | Status: AC

## 2017-09-19 MED ORDER — CLOTRIMAZOLE 1 % EX CREA: 1.0000 "application " | TOPICAL_CREAM | Freq: Two times a day (BID) | CUTANEOUS | 0 refills | Status: DC

## 2017-09-19 MED ORDER — ALBUTEROL SULFATE HFA 108 (90 BASE) MCG/ACT IN AERS: 2.0000 | INHALATION_SPRAY | Freq: Four times a day (QID) | RESPIRATORY_TRACT | 0 refills | Status: DC | PRN

## 2017-09-19 MED ORDER — FLUCONAZOLE 150 MG PO TABS: ORAL_TABLET | ORAL | 0 refills | Status: DC

## 2017-09-19 MED ORDER — DOXYCYCLINE HYCLATE 100 MG PO TABS: 100.0000 mg | ORAL_TABLET | Freq: Two times a day (BID) | ORAL | 0 refills | Status: AC

## 2017-09-19 MED ORDER — BUDESONIDE-FORMOTEROL FUMARATE 160-4.5 MCG/ACT IN AERO: 2.0000 | INHALATION_SPRAY | Freq: Two times a day (BID) | RESPIRATORY_TRACT | 0 refills | Status: DC

## 2017-09-19 NOTE — Progress Notes (Signed)
Subjective: CC: Bottom raw, chest congestion PCP: Claretta Fraise, MD [name] is a 84 y.o. female presenting to clinic today for:  1.  Rash on buttocks Patient is accompanied by her daughters who reports that she has been having "rawness on her bottom from front to back" for a few weeks.  She states that patient often will sit in her adult diapers for several hours during the day because she is unwilling to allow personal care services to change her.  She states that over the weekend symptoms have gotten slightly better because she has been home with her and has been changing her regularly.  She is been applying Boudreau's Butt paste to affected area with some improvement as well.  She notes some skin breakdown and peeling.  Denies any purulence.  No known fevers.  Patient scratches at areas quite a bit but denies any significant pain or tenderness.  2.  Cough and congestion Patient with several day history of cough and congestion.  Her daughter has been trying to get her to take Mucinex twice daily but she often does not take the medication as directed.  She states that she has been giving her the medicine over the last couple of days which does seem to be helping some but patient continues to have a productive cough.  She is got inhalers at home but patient does not use these regularly as directed.  Again no fevers, hemoptysis.  Medical history is significant for centrilobular emphysema and lung cancer.   ROS: Per HPI  Allergies  Allergen Reactions  . Codeine Anaphylaxis  . Penicillins Anaphylaxis  . Sulfa Antibiotics Anaphylaxis   Past Medical History:  Diagnosis Date  . Anxiety disorder   . Cancer ([place])   . Colon adenoma   . COPD (chronic obstructive pulmonary disease) ([place])   . Depression   . Fall at home May 17, 2012   Pt. fell in tub today  . GERD (gastroesophageal reflux disease)   . Hyperlipidemia   . Irritable bowel syndrome   . Lymphocytic colitis 12/15/2011  .  Peptic ulcer disease   . Pneumonia 2007   prolonged ICU stay on vent with trach  . Pseudotumor cerebri   . Pulmonary embolism ([place]) 2004   off anticoagulation  . Radiation 4/13,4/15,04/30/14   Left lower lung    Current Outpatient Medications:  .  albuterol (PROVENTIL HFA;VENTOLIN HFA) 108 (90 Base) MCG/ACT inhaler, Inhale 2 puffs into the lungs every 4 (four) hours as needed for wheezing or shortness of breath., Disp: 8.5 g, Rfl: 5 .  ALPRAZolam (XANAX) 0.5 MG tablet, TAKE (1) TABLET TWICE A DAY., Disp: 60 tablet, Rfl: 4 .  aspirin EC 325 MG tablet, Take 325 mg by mouth daily., Disp: , Rfl:  .  budesonide-formoterol (SYMBICORT) 160-4.5 MCG/ACT inhaler, Inhale 2 puffs into the lungs 2 (two) times daily., Disp: , Rfl:  .  Cholecalciferol (VITAMIN D) 2000 UNITS tablet, Take 2,000 Units by mouth daily., Disp: , Rfl:  .  ciprofloxacin (CIPRO) 500 MG tablet, Take 1 tablet (500 mg total) by mouth 2 (two) times daily., Disp: 14 tablet, Rfl: 0 .  CRANBERRY PO, Take 1 tablet by mouth daily., Disp: , Rfl:  .  furosemide (LASIX) 20 MG tablet, Take 1 tablet (20 mg total) by mouth daily., Disp: 30 tablet, Rfl: 5 .  oxybutynin (DITROPAN) 5 MG tablet, Take 1 tablet (5 mg total) by mouth 4 (four) times daily., Disp: 120 tablet, Rfl: 2 .  pantoprazole (PROTONIX) 40 MG tablet, Take 1 tablet (40 mg total) by mouth daily., Disp: 30 tablet, Rfl: 5 .  potassium chloride (K-DUR,KLOR-CON) 10 MEQ tablet, Take 1 tablet (10 mEq total) by mouth daily. As a potassium supplement, Disp: 30 tablet, Rfl: 5 .  Triamcinolone Acetonide (TRIAMCINOLONE 0.1 % CREAM : EUCERIN) CREA, Apply 1 application topically 3 (three) times daily., Disp: 1 each, Rfl: 0 .  vitamin E 400 UNIT capsule, Take 400 Units by mouth daily., Disp: , Rfl:   Current Facility-Administered Medications:  .  ferrous fumarate (HEMOCYTE - 106 mg FE) tablet 106 mg of iron, 1 tablet, Oral, Daily, Claretta Fraise, MD Social History   Socioeconomic History  .  Marital status: Widowed    Spouse name: Not on file  . Number of children: Not on file  . Years of education: Not on file  . Highest education level: Not on file  Occupational History  . Occupation: retired    Fish farm manager: RETIRED  Social Needs  . Financial resource strain: Not on file  . Food insecurity:    Worry: Not on file    Inability: Not on file  . Transportation needs:    Medical: Not on file    Non-medical: Not on file  Tobacco Use  . Smoking status: Current Every Day Smoker    Packs/day: 0.50    Years: 60.00    Pack years: 30.00    Types: Cigarettes  . Smokeless tobacco: Never Used  . Tobacco comment: 2/3 pks per day  Substance and Sexual Activity  . Alcohol use: No    Alcohol/week: 0.0 standard drinks  . Drug use: No  . Sexual activity: Never  Lifestyle  . Physical activity:    Days per week: Not on file    Minutes per session: Not on file  . Stress: Not on file  Relationships  . Social connections:    Talks on phone: Not on file    Gets together: Not on file    Attends religious service: Not on file    Active member of club or organization: Not on file    Attends meetings of clubs or organizations: Not on file    Relationship status: Not on file  . Intimate partner violence:    Fear of current or ex partner: Not on file    Emotionally abused: Not on file    Physically abused: Not on file    Forced sexual activity: Not on file  Other Topics Concern  . Not on file  Social History Narrative  . Not on file   Family History  Problem Relation Age of Onset  . Lung cancer Father   . Cancer Mother   . Hyperlipidemia Mother   . Hypertension Mother   . Heart attack Mother   . Hyperlipidemia Sister   . Colon cancer Neg Hx     Objective: Office vital signs reviewed. BP 139/68   Pulse 90   Temp 98.6 F (37 C) (Oral)   Ht 5\' 5"  (1.651 m)   Wt 112 lb (50.8 kg)   BMI 18.64 kg/m   Physical Examination:  General: Awake, alert, thin, frail appearing  elderly female. No acute distress HEENT: MMM, sclera white Cardio: regular rate and rhythm, S1S2 heard, no murmurs appreciated Pulm: Global expiratory wheezes noted with decreased breath sounds throughout.  No rhonchi or rales.  Normal work of breathing on room air GU: Macerated, hyperpigmented skin noted throughout the entire groin.  Satellite lesions are appreciated,  particularly in the upper buttocks.  She is got quite a bit of peeling skin and what appears to be a stage II pressure ulcer on the right inner gluteal cheek.  There is no gross discharge or bleeding appreciated.  Assessment/ Plan: 84 y.o. female   1. COPD exacerbation ([place]) I suspect COPD exacerbation given productive cough.  She has known emphysema.  Will treat with prednisone burst, doxycycline p.o. twice daily for 7 days and albuterol inhaler.  Her Symbicort is also been refilled.  Reasons for return and emergent evaluation discussed.  Follow-up with PCP as needed.  2. Yeast dermatitis Likely initially a chemical dermatitis but given satellite lesions, will treat for candidiasis.  I have placed her on oral Diflucan once weekly for the next 2 weeks.  She will then transition over to topical clotrimazole twice daily for the following 2 weeks.  I recommended that she follow-up in 2 weeks for recheck.  If any evidence of secondary bacterial infection, will need to initiate oral antibiotics to cover.  Keep patient dry and clean.  Her family is actively working on placing her, as she seems to be declining.  Additionally, her daughter is to reach out to [facility] in efforts to secure personal care service attendant.   Meds ordered this encounter  Medications  . fluconazole (DIFLUCAN) 150 MG tablet    Sig: Take 1 tablet ONE time per week x2 weeks.    Dispense:  1 tablet    Refill:  0  . clotrimazole (LOTRIMIN) 1 % cream    Sig: Apply 1 application topically 2 (two) times daily. x2 weeks (start AFTER pill)    Dispense:  60 g     Refill:  0  . doxycycline (VIBRA-TABS) 100 MG tablet    Sig: Take 1 tablet (100 mg total) by mouth 2 (two) times daily for 7 days.    Dispense:  14 tablet    Refill:  0  . predniSONE (DELTASONE) 20 MG tablet    Sig: Take 2 tablets (40 mg total) by mouth daily with breakfast for 5 days.    Dispense:  10 tablet    Refill:  0  . budesonide-formoterol (SYMBICORT) 160-4.5 MCG/ACT inhaler    Sig: Inhale 2 puffs into the lungs 2 (two) times daily.    Dispense:  1 Inhaler    Refill:  0  . albuterol (PROVENTIL HFA;VENTOLIN HFA) 108 (90 Base) MCG/ACT inhaler    Sig: Inhale 2 puffs into the lungs every 6 (six) hours as needed for wheezing or shortness of breath.    Dispense:  1 Inhaler    Refill:  [place], [place] [phone]

## 2017-09-19 NOTE — Patient Instructions (Signed)
I am treating her as a COPD exacerbation with doxycycline twice daily for the next 7 days.  Make sure that she eats with the antibiotic.  I have also placed her on prednisone burst for the next 5 days.  She should take this with breakfast each morning.  She should use her albuterol inhaler 2 puffs every 6 hours for the next 2 days then as needed as directed.  She may start her Symbicort after she has completed the prednisone.  For her yeast infection, I have placed her on fluconazole.  She will take 1 tablet 1 time per week for the next 2 weeks.  Then I would like her to switch over to the topical antifungal to use twice a day for 2 additional weeks.  Plan to see me back in 2 weeks for recheck.

## 2017-09-21 ENCOUNTER — Other Ambulatory Visit: Payer: Self-pay | Admitting: Family

## 2017-09-23 NOTE — Progress Notes (Signed)
I spoke with the patient's PCP in the inpatient facility she resides in who saw her a few days ago, but also with her long term PCP provider Evelina Dun, NP who has spoken with the patient and the patient wishes to discontinue surveillance due to other ongoing chronic medical issues. We will cancel her previously ordered CT imaging.

## 2017-10-04 ENCOUNTER — Ambulatory Visit: Payer: Medicare Other | Admitting: Family Medicine

## 2017-11-20 DIAGNOSIS — R300 Dysuria: Secondary | ICD-10-CM | POA: Diagnosis not present

## 2017-11-20 DIAGNOSIS — N3001 Acute cystitis with hematuria: Secondary | ICD-10-CM | POA: Diagnosis not present

## 2017-11-23 ENCOUNTER — Ambulatory Visit: Payer: Medicare Other | Admitting: Family

## 2017-11-23 ENCOUNTER — Encounter (HOSPITAL_COMMUNITY): Payer: Medicare Other

## 2017-12-14 ENCOUNTER — Other Ambulatory Visit: Payer: Self-pay | Admitting: Family Medicine

## 2017-12-14 ENCOUNTER — Ambulatory Visit: Payer: Medicare Other

## 2017-12-15 ENCOUNTER — Ambulatory Visit (INDEPENDENT_AMBULATORY_CARE_PROVIDER_SITE_OTHER): Payer: Medicare Other

## 2017-12-15 DIAGNOSIS — Z23 Encounter for immunization: Secondary | ICD-10-CM

## 2017-12-15 NOTE — Telephone Encounter (Signed)
Last seen 09/05/17

## 2017-12-26 ENCOUNTER — Ambulatory Visit (INDEPENDENT_AMBULATORY_CARE_PROVIDER_SITE_OTHER): Payer: Medicare Other | Admitting: Family Medicine

## 2017-12-26 ENCOUNTER — Encounter: Payer: Self-pay | Admitting: Family Medicine

## 2017-12-26 VITALS — BP 127/67 | HR 89 | Temp 98.4°F | Ht 65.0 in | Wt 119.4 lb

## 2017-12-26 DIAGNOSIS — R410 Disorientation, unspecified: Secondary | ICD-10-CM | POA: Diagnosis not present

## 2017-12-26 DIAGNOSIS — N3001 Acute cystitis with hematuria: Secondary | ICD-10-CM

## 2017-12-26 DIAGNOSIS — N390 Urinary tract infection, site not specified: Secondary | ICD-10-CM | POA: Diagnosis not present

## 2017-12-26 DIAGNOSIS — R300 Dysuria: Secondary | ICD-10-CM | POA: Diagnosis not present

## 2017-12-26 LAB — URINALYSIS
Bilirubin, UA: NEGATIVE
Glucose, UA: NEGATIVE
Ketones, UA: NEGATIVE
Nitrite, UA: POSITIVE — AB
PH UA: 7 (ref 5.0–7.5)
PROTEIN UA: NEGATIVE
Specific Gravity, UA: 1.015 (ref 1.005–1.030)
Urobilinogen, Ur: 0.2 mg/dL (ref 0.2–1.0)

## 2017-12-26 MED ORDER — CIPROFLOXACIN HCL 250 MG PO TABS: 250.0000 mg | ORAL_TABLET | Freq: Two times a day (BID) | ORAL | 0 refills | Status: DC

## 2017-12-26 NOTE — Progress Notes (Signed)
Subjective: CC: altered mental status PCP: Claretta Fraise, MD [name] is a 84 y.o. female presenting to clinic today for:  1. Altered mental status Patient is brought to the office by family member who notes that she has been having some confusion since Friday.  Today she became very altered.  She has not had any fevers, vomiting, blood in urine.  She does often refuse to be changed and sits in soiled adult diapers.  She has had recurrent urinary tract infections and with each urinary tract infection she has altered mental status.  This is a telltale sign that she has an infection.  She notes last UTI was only a few weeks ago and she was seen at urgent care for this.   ROS: Per HPI  Allergies  Allergen Reactions  . Codeine Anaphylaxis  . Penicillins Anaphylaxis  . Sulfa Antibiotics Anaphylaxis   Past Medical History:  Diagnosis Date  . Anxiety disorder   . Cancer ([place])   . Colon adenoma   . COPD (chronic obstructive pulmonary disease) ([place])   . Depression   . Fall at home May 17, 2012   Pt. fell in tub today  . GERD (gastroesophageal reflux disease)   . Hyperlipidemia   . Irritable bowel syndrome   . Lymphocytic colitis 12/15/2011  . Peptic ulcer disease   . Pneumonia 2007   prolonged ICU stay on vent with trach  . Pseudotumor cerebri   . Pulmonary embolism ([place]) 2004   off anticoagulation  . Radiation 4/13,4/15,04/30/14   Left lower lung    Current Outpatient Medications:  .  albuterol (PROVENTIL HFA;VENTOLIN HFA) 108 (90 Base) MCG/ACT inhaler, Inhale 2 puffs into the lungs every 6 (six) hours as needed for wheezing or shortness of breath., Disp: 1 Inhaler, Rfl: 0 .  ALPRAZolam (XANAX) 0.5 MG tablet, TAKE (1) TABLET TWICE A DAY., Disp: 60 tablet, Rfl: 0 .  aspirin EC 325 MG tablet, Take 325 mg by mouth daily., Disp: , Rfl:  .  budesonide-formoterol (SYMBICORT) 160-4.5 MCG/ACT inhaler, Inhale 2 puffs into the lungs 2 (two) times daily., Disp: 1 Inhaler, Rfl:  0 .  Cholecalciferol (VITAMIN D) 2000 UNITS tablet, Take 2,000 Units by mouth daily., Disp: , Rfl:  .  clotrimazole (LOTRIMIN) 1 % cream, Apply 1 application topically 2 (two) times daily. x2 weeks (start AFTER pill), Disp: 60 g, Rfl: 0 .  CRANBERRY PO, Take 1 tablet by mouth daily., Disp: , Rfl:  .  fluconazole (DIFLUCAN) 150 MG tablet, Take 1 tablet ONE time per week x2 weeks., Disp: 1 tablet, Rfl: 0 .  furosemide (LASIX) 20 MG tablet, Take 1 tablet (20 mg total) by mouth daily., Disp: 30 tablet, Rfl: 5 .  oxybutynin (DITROPAN) 5 MG tablet, Take 1 tablet (5 mg total) by mouth 4 (four) times daily., Disp: 120 tablet, Rfl: 2 .  pantoprazole (PROTONIX) 40 MG tablet, Take 1 tablet (40 mg total) by mouth daily., Disp: 30 tablet, Rfl: 5 .  potassium chloride (K-DUR,KLOR-CON) 10 MEQ tablet, Take 1 tablet (10 mEq total) by mouth daily. As a potassium supplement, Disp: 30 tablet, Rfl: 5 .  Triamcinolone Acetonide (TRIAMCINOLONE 0.1 % CREAM : EUCERIN) CREA, Apply 1 application topically 3 (three) times daily., Disp: 1 each, Rfl: 0 .  vitamin E 400 UNIT capsule, Take 400 Units by mouth daily., Disp: , Rfl:   Current Facility-Administered Medications:  .  ferrous fumarate (HEMOCYTE - 106 mg FE) tablet 106 mg of iron, 1 tablet, Oral,  Daily, Claretta Fraise, MD Social History   Socioeconomic History  . Marital status: Widowed    Spouse name: Not on file  . Number of children: Not on file  . Years of education: Not on file  . Highest education level: Not on file  Occupational History  . Occupation: retired    Fish farm manager: RETIRED  Social Needs  . Financial resource strain: Not on file  . Food insecurity:    Worry: Not on file    Inability: Not on file  . Transportation needs:    Medical: Not on file    Non-medical: Not on file  Tobacco Use  . Smoking status: Current Every Day Smoker    Packs/day: 0.50    Years: 60.00    Pack years: 30.00    Types: Cigarettes  . Smokeless tobacco: Never Used  .  Tobacco comment: 2/3 pks per day  Substance and Sexual Activity  . Alcohol use: No    Alcohol/week: 0.0 standard drinks  . Drug use: No  . Sexual activity: Never  Lifestyle  . Physical activity:    Days per week: Not on file    Minutes per session: Not on file  . Stress: Not on file  Relationships  . Social connections:    Talks on phone: Not on file    Gets together: Not on file    Attends religious service: Not on file    Active member of club or organization: Not on file    Attends meetings of clubs or organizations: Not on file    Relationship status: Not on file  . Intimate partner violence:    Fear of current or ex partner: Not on file    Emotionally abused: Not on file    Physically abused: Not on file    Forced sexual activity: Not on file  Other Topics Concern  . Not on file  Social History Narrative  . Not on file   Family History  Problem Relation Age of Onset  . Lung cancer Father   . Cancer Mother   . Hyperlipidemia Mother   . Hypertension Mother   . Heart attack Mother   . Hyperlipidemia Sister   . Colon cancer Neg Hx     Objective: Office vital signs reviewed. BP 127/67   Pulse 89   Temp 98.4 F (36.9 C) (Oral)   Ht 5\' 5"  (1.651 m)   Wt 119 lb 6.4 oz (54.2 kg)   BMI 19.87 kg/m   Physical Examination:  General: Awake, alert, chronically ill appearing, No acute distress Neuro: Interacts with provider but is not oriented.  Assessment/ Plan: 84 y.o. female   1. Acute cystitis with hematuria Patient is afebrile nontoxic-appearing.  Her urinalysis shows nitrites, trace blood and leukocytes.  Altered mentation is presumed to be related to acute cystitis.  I reviewed her last urine culture which demonstrated intermediate responsiveness/resistance to many antibiotics.  She is also allergic to several antibiotics.  Both the species of E. coli seem to be responsive to Cipro, which she is used without difficulty in the past.  This is been prescribed 250  mg p.o. twice daily for the next 3 days.  Home care instructions reviewed and reasons for emergent evaluation emergency department discussed.  We discussed that if symptoms do not improve or if they worsen low threshold to be evaluated the emergency department as she will likely need IV antibiotics at that point.  I have also placed a referral to urology given recurrence  of urinary tract infections.  She may benefit from daily antibiotic though I do suspect that her allergies to multiple antibiotics may be limiting. - Urine Culture - Urinalysis - Ambulatory referral to Urology  2. Recurrent UTI - Ambulatory referral to Urology  3. Delirium As above   Orders Placed This Encounter  Procedures  . Urine Culture  . Urinalysis   Meds ordered this encounter  Medications  . ciprofloxacin (CIPRO) 250 MG tablet    Sig: Take 1 tablet (250 mg total) by mouth 2 (two) times daily.    Dispense:  6 tablet    Refill:  0     Dameshia Seybold Windell Moulding, DO [place] [phone]

## 2017-12-26 NOTE — Patient Instructions (Signed)
Because she has many drug allergies and her last urine culture was resistant to multiple antibiotics, I am prescribing Cipro again for urinary tract infection.  She should take this twice daily for the next 3 days.  If symptoms worsen or do not improve, do not hesitate to seek immediate medical attention. Additionally, since this is recurrent I have placed a referral to urology.  She may need to be on a daily prophylactic antibiotic.

## 2017-12-30 LAB — URINE CULTURE

## 2018-01-13 DIAGNOSIS — R300 Dysuria: Secondary | ICD-10-CM | POA: Diagnosis not present

## 2018-01-13 DIAGNOSIS — N3001 Acute cystitis with hematuria: Secondary | ICD-10-CM | POA: Diagnosis not present

## 2018-01-29 ENCOUNTER — Other Ambulatory Visit: Payer: Self-pay | Admitting: Family Medicine

## 2018-02-01 ENCOUNTER — Ambulatory Visit: Payer: Medicare Other | Admitting: Family Medicine

## 2018-02-05 ENCOUNTER — Encounter: Payer: Self-pay | Admitting: Family Medicine

## 2018-02-05 ENCOUNTER — Ambulatory Visit (INDEPENDENT_AMBULATORY_CARE_PROVIDER_SITE_OTHER): Payer: Medicare Other | Admitting: Family Medicine

## 2018-02-05 VITALS — BP 129/73 | HR 96 | Temp 99.3°F | Ht 65.0 in | Wt 122.1 lb

## 2018-02-05 DIAGNOSIS — K219 Gastro-esophageal reflux disease without esophagitis: Secondary | ICD-10-CM

## 2018-02-05 DIAGNOSIS — J329 Chronic sinusitis, unspecified: Secondary | ICD-10-CM | POA: Diagnosis not present

## 2018-02-05 DIAGNOSIS — G301 Alzheimer's disease with late onset: Secondary | ICD-10-CM

## 2018-02-05 DIAGNOSIS — F0280 Dementia in other diseases classified elsewhere without behavioral disturbance: Secondary | ICD-10-CM | POA: Diagnosis not present

## 2018-02-05 DIAGNOSIS — J40 Bronchitis, not specified as acute or chronic: Secondary | ICD-10-CM

## 2018-02-05 MED ORDER — OXYBUTYNIN CHLORIDE 5 MG PO TABS: 5.0000 mg | ORAL_TABLET | Freq: Four times a day (QID) | ORAL | 5 refills | Status: AC

## 2018-02-05 MED ORDER — PANTOPRAZOLE SODIUM 40 MG PO TBEC: 40.0000 mg | DELAYED_RELEASE_TABLET | Freq: Every day | ORAL | 5 refills | Status: AC

## 2018-02-05 MED ORDER — FUROSEMIDE 20 MG PO TABS: 20.0000 mg | ORAL_TABLET | Freq: Every day | ORAL | 5 refills | Status: DC

## 2018-02-05 MED ORDER — BUDESONIDE-FORMOTEROL FUMARATE 160-4.5 MCG/ACT IN AERO: 2.0000 | INHALATION_SPRAY | Freq: Two times a day (BID) | RESPIRATORY_TRACT | 5 refills | Status: AC

## 2018-02-05 MED ORDER — AZITHROMYCIN 250 MG PO TABS: ORAL_TABLET | ORAL | 0 refills | Status: DC

## 2018-02-05 MED ORDER — ALPRAZOLAM 0.5 MG PO TABS: ORAL_TABLET | ORAL | 2 refills | Status: DC

## 2018-02-05 MED ORDER — POTASSIUM CHLORIDE CRYS ER 10 MEQ PO TBCR: 10.0000 meq | EXTENDED_RELEASE_TABLET | Freq: Every day | ORAL | 5 refills | Status: DC

## 2018-02-05 MED ORDER — ALBUTEROL SULFATE HFA 108 (90 BASE) MCG/ACT IN AERS: 2.0000 | INHALATION_SPRAY | Freq: Four times a day (QID) | RESPIRATORY_TRACT | 5 refills | Status: AC | PRN

## 2018-02-05 MED ORDER — DONEPEZIL HCL 5 MG PO TABS: 5.0000 mg | ORAL_TABLET | Freq: Every day | ORAL | 1 refills | Status: AC

## 2018-02-05 NOTE — Progress Notes (Signed)
Subjective:  Patient ID: [name], female    DOB: 1932/11/21  Age: 85 y.o. MRN: [id number]  CC: Cough (pt here today c/o cough and congestion) and Medication Refill   HPI GENORA ARP presents for Symptoms include congestion, facial pain, nasal congestion, non productive cough, post nasal drip and sinus pressure. There is no fever, chills, or sweats. Onset of symptoms was a few days ago, gradually worsening since that time.  Granddaughter tells me she gets confused and doesn't know where she is. Wants to go home even when home. mini-mental status exam  Re: MMSE patient knew the name of the [place] state.  She knew the date.  She was able to initially repeat 3 objects.  However after time she could not repeat any of these.  She could not spell world backwards getting only the first letter of the the correct.  She was not able to write a sentence.  She did not follow a three-step direction appropriately.  She followed simple direction correctly.  She was not able to reproduce rhomboids.  Clock drawing test: Dementia most likely Alzheimer's  Depression screen [facility] 2/9 09/05/2017 02/14/2017 08/24/2016  Decreased Interest 2 0 0  Down, Depressed, Hopeless 2 0 0  PHQ - 2 Score 4 0 0  Altered sleeping 1 - -  Tired, decreased energy 2 - -  Change in appetite 2 - -  Feeling bad or failure about yourself  2 - -  Trouble concentrating 3 - -  Moving slowly or fidgety/restless 2 - -  Suicidal thoughts 2 - -  PHQ-9 Score 18 - -   GAD 7 : Generalized Anxiety Score 02/05/2018  Nervous, Anxious, on Edge 1  Control/stop worrying 3  Worry too much - different things 2  Trouble relaxing 1  Restless 0  Easily annoyed or irritable 2  Afraid - awful might happen 2  Total GAD 7 Score 11      History Hodan has a past medical history of Anxiety disorder, Cancer ([place]), Colon adenoma, COPD (chronic obstructive pulmonary disease) ([place]), Depression, Fall at home (May 17, 2012), GERD  (gastroesophageal reflux disease), Hyperlipidemia, Irritable bowel syndrome, Lymphocytic colitis (12/15/2011), Peptic ulcer disease, Pneumonia (2007), Pseudotumor cerebri, Pulmonary embolism ([place]) (2004), and Radiation (4/13,4/15,04/30/14).   She has a past surgical history that includes Cholecystectomy; Colon resection; Abdominal hysterectomy; Brain surgery; Cataract extraction; Upper gastrointestinal endoscopy; Colonoscopy; Lower extremity stents; abdominal aortagram (N/A, 08/10/2012); and Insertion of iliac stent (Bilateral, 08/10/2012).   Her family history includes Cancer in her mother; Heart attack in her mother; Hyperlipidemia in her mother and sister; Hypertension in her mother; Lung cancer in her father.She reports that she has been smoking cigarettes. She has a 30.00 pack-year smoking history. She has never used smokeless tobacco. She reports that she does not drink alcohol or use drugs.    ROS Review of Systems  Constitutional: Negative for activity change, appetite change, chills and fever.  HENT: Positive for congestion, postnasal drip, rhinorrhea and sinus pressure. Negative for ear discharge, ear pain, hearing loss, nosebleeds, sneezing and trouble swallowing.   Respiratory: Negative for chest tightness and shortness of breath.   Cardiovascular: Negative for chest pain and palpitations.  Skin: Negative for rash.    Objective:  BP 129/73   Pulse 96   Temp 99.3 F (37.4 C) (Oral)   Ht 5\' 5"  (1.651 m)   Wt 122 lb 2 oz (55.4 kg)   BMI 20.32 kg/m   BP Readings from  Last 3 Encounters:  02/05/18 129/73  12/26/17 127/67  09/19/17 139/68    Wt Readings from Last 3 Encounters:  02/05/18 122 lb 2 oz (55.4 kg)  12/26/17 119 lb 6.4 oz (54.2 kg)  09/19/17 112 lb (50.8 kg)     Physical Exam Constitutional:      General: She is not in acute distress.    Appearance: She is well-developed.  Cardiovascular:     Rate and Rhythm: Normal rate and regular rhythm.  Pulmonary:      Breath sounds: Normal breath sounds.  Skin:    General: Skin is warm and dry.  Neurological:     Mental Status: She is alert and oriented to person, place, and time.  Psychiatric:        Cognition and Memory: Cognition is impaired. Memory is impaired. She exhibits impaired recent memory and impaired remote memory.       Assessment & Plan:   Annely was seen today for cough and medication refill.  Diagnoses and all orders for this visit:  Late onset Alzheimer's disease without behavioral disturbance (HCC)  Gastroesophageal reflux disease, esophagitis presence not specified -     pantoprazole (PROTONIX) 40 MG tablet; Take 1 tablet (40 mg total) by mouth daily.  Other orders -     albuterol (PROVENTIL HFA;VENTOLIN HFA) 108 (90 Base) MCG/ACT inhaler; Inhale 2 puffs into the lungs every 6 (six) hours as needed for wheezing or shortness of breath. -     budesonide-formoterol (SYMBICORT) 160-4.5 MCG/ACT inhaler; Inhale 2 puffs into the lungs 2 (two) times daily. -     furosemide (LASIX) 20 MG tablet; Take 1 tablet (20 mg total) by mouth daily. -     oxybutynin (DITROPAN) 5 MG tablet; Take 1 tablet (5 mg total) by mouth 4 (four) times daily. -     potassium chloride (K-DUR,KLOR-CON) 10 MEQ tablet; Take 1 tablet (10 mEq total) by mouth daily. As a potassium supplement -     ALPRAZolam (XANAX) 0.5 MG tablet; TAKE (1) TABLET TWICE A DAY. -     donepezil (ARICEPT) 5 MG tablet; Take 1 tablet (5 mg total) by mouth at bedtime. -     azithromycin (ZITHROMAX Z-PAK) 250 MG tablet; Take two right away Then one a day for the next 4 days.       I have discontinued [name]. Trimarco's fluconazole and ciprofloxacin. I am also having her start on donepezil and azithromycin. Additionally, I am having her maintain her aspirin EC, CRANBERRY PO, Vitamin D, vitamin E, triamcinolone 0.1 % cream : eucerin, clotrimazole, pantoprazole, albuterol, budesonide-formoterol, furosemide, oxybutynin, potassium chloride,  and ALPRAZolam. We will continue to administer ferrous fumarate.  Allergies as of 02/05/2018      Reactions   Codeine Anaphylaxis   Penicillins Anaphylaxis   Sulfa Antibiotics Anaphylaxis      Medication List       Accurate as of February 05, 2018  5:47 PM. Always use your most recent med list.        albuterol 108 (90 Base) MCG/ACT inhaler Commonly known as:  PROVENTIL HFA;VENTOLIN HFA Inhale 2 puffs into the lungs every 6 (six) hours as needed for wheezing or shortness of breath.   ALPRAZolam 0.5 MG tablet Commonly known as:  XANAX TAKE (1) TABLET TWICE A DAY.   aspirin EC 325 MG tablet Take 325 mg by mouth daily.   azithromycin 250 MG tablet Commonly known as:  ZITHROMAX Z-PAK Take two right away Then one a day  for the next 4 days.   budesonide-formoterol 160-4.5 MCG/ACT inhaler Commonly known as:  SYMBICORT Inhale 2 puffs into the lungs 2 (two) times daily.   clotrimazole 1 % cream Commonly known as:  LOTRIMIN Apply 1 application topically 2 (two) times daily. x2 weeks (start AFTER pill)   CRANBERRY PO Take 1 tablet by mouth daily.   donepezil 5 MG tablet Commonly known as:  ARICEPT Take 1 tablet (5 mg total) by mouth at bedtime.   furosemide 20 MG tablet Commonly known as:  LASIX Take 1 tablet (20 mg total) by mouth daily.   oxybutynin 5 MG tablet Commonly known as:  DITROPAN Take 1 tablet (5 mg total) by mouth 4 (four) times daily.   pantoprazole 40 MG tablet Commonly known as:  PROTONIX Take 1 tablet (40 mg total) by mouth daily.   potassium chloride 10 MEQ tablet Commonly known as:  K-DUR,KLOR-CON Take 1 tablet (10 mEq total) by mouth daily. As a potassium supplement   triamcinolone 0.1 % cream : eucerin Crea Apply 1 application topically 3 (three) times daily.   Vitamin D 50 MCG (2000 UT) tablet Take 2,000 Units by mouth daily.   vitamin E 400 UNIT capsule Take 400 Units by mouth daily.        Follow-up: No follow-ups on  file.  Claretta Fraise, M.D.

## 2018-02-06 ENCOUNTER — Ambulatory Visit: Payer: Medicare Other | Admitting: Family Medicine

## 2018-02-08 ENCOUNTER — Other Ambulatory Visit: Payer: Self-pay | Admitting: Physician Assistant

## 2018-02-08 ENCOUNTER — Telehealth: Payer: Self-pay | Admitting: Family Medicine

## 2018-02-08 MED ORDER — CIPROFLOXACIN HCL 250 MG PO TABS: 250.0000 mg | ORAL_TABLET | Freq: Two times a day (BID) | ORAL | 0 refills | Status: DC

## 2018-02-08 NOTE — Telephone Encounter (Signed)
Sent cipro

## 2018-02-08 NOTE — Telephone Encounter (Signed)
Patient was seen 1/27.  Daughter states she thinks patient has a UTI that started lastnight- states she has been acting more confused than normal and wander more.  States these are the normal signs for her when she has UTI.  Offered apt for tomorrow with PCP and daughter states she was wanting to know if someone could call her mom in something since they were in here Monday. Covering PCP - please advise

## 2018-02-09 ENCOUNTER — Encounter: Payer: Self-pay | Admitting: Family Medicine

## 2018-02-09 ENCOUNTER — Other Ambulatory Visit: Payer: Self-pay | Admitting: Family Medicine

## 2018-02-09 MED ORDER — NITROFURANTOIN MONOHYD MACRO 100 MG PO CAPS: 100.0000 mg | ORAL_CAPSULE | Freq: Two times a day (BID) | ORAL | 0 refills | Status: DC

## 2018-02-20 DIAGNOSIS — B351 Tinea unguium: Secondary | ICD-10-CM | POA: Diagnosis not present

## 2018-02-20 DIAGNOSIS — I70203 Unspecified atherosclerosis of native arteries of extremities, bilateral legs: Secondary | ICD-10-CM | POA: Diagnosis not present

## 2018-02-20 DIAGNOSIS — L84 Corns and callosities: Secondary | ICD-10-CM | POA: Diagnosis not present

## 2018-02-20 DIAGNOSIS — M79676 Pain in unspecified toe(s): Secondary | ICD-10-CM | POA: Diagnosis not present

## 2018-02-23 ENCOUNTER — Emergency Department (HOSPITAL_COMMUNITY): Payer: Medicare Other

## 2018-02-23 ENCOUNTER — Encounter (HOSPITAL_COMMUNITY): Payer: Self-pay

## 2018-02-23 ENCOUNTER — Inpatient Hospital Stay (HOSPITAL_COMMUNITY)
Admission: EM | Admit: 2018-02-23 | Discharge: 2018-03-02 | DRG: 871 | Disposition: A | Discharge status: Skilled Nursing Facility | Payer: Medicare Other | Attending: Family Medicine | Admitting: Family Medicine

## 2018-02-23 DIAGNOSIS — F17210 Nicotine dependence, cigarettes, uncomplicated: Secondary | ICD-10-CM | POA: Diagnosis present

## 2018-02-23 DIAGNOSIS — Z880 Allergy status to penicillin: Secondary | ICD-10-CM

## 2018-02-23 DIAGNOSIS — Z7982 Long term (current) use of aspirin: Secondary | ICD-10-CM | POA: Diagnosis not present

## 2018-02-23 DIAGNOSIS — R0603 Acute respiratory distress: Secondary | ICD-10-CM | POA: Diagnosis present

## 2018-02-23 DIAGNOSIS — I340 Nonrheumatic mitral (valve) insufficiency: Secondary | ICD-10-CM | POA: Diagnosis not present

## 2018-02-23 DIAGNOSIS — Z8249 Family history of ischemic heart disease and other diseases of the circulatory system: Secondary | ICD-10-CM

## 2018-02-23 DIAGNOSIS — I248 Other forms of acute ischemic heart disease: Secondary | ICD-10-CM | POA: Diagnosis present

## 2018-02-23 DIAGNOSIS — R0689 Other abnormalities of breathing: Secondary | ICD-10-CM | POA: Diagnosis not present

## 2018-02-23 DIAGNOSIS — A419 Sepsis, unspecified organism: Principal | ICD-10-CM | POA: Diagnosis present

## 2018-02-23 DIAGNOSIS — I471 Supraventricular tachycardia: Secondary | ICD-10-CM | POA: Diagnosis not present

## 2018-02-23 DIAGNOSIS — R404 Transient alteration of awareness: Secondary | ICD-10-CM | POA: Diagnosis not present

## 2018-02-23 DIAGNOSIS — Z86711 Personal history of pulmonary embolism: Secondary | ICD-10-CM | POA: Diagnosis not present

## 2018-02-23 DIAGNOSIS — Z801 Family history of malignant neoplasm of trachea, bronchus and lung: Secondary | ICD-10-CM | POA: Diagnosis not present

## 2018-02-23 DIAGNOSIS — I959 Hypotension, unspecified: Secondary | ICD-10-CM | POA: Diagnosis not present

## 2018-02-23 DIAGNOSIS — Z882 Allergy status to sulfonamides status: Secondary | ICD-10-CM | POA: Diagnosis not present

## 2018-02-23 DIAGNOSIS — J439 Emphysema, unspecified: Secondary | ICD-10-CM | POA: Diagnosis not present

## 2018-02-23 DIAGNOSIS — I5031 Acute diastolic (congestive) heart failure: Secondary | ICD-10-CM | POA: Diagnosis present

## 2018-02-23 DIAGNOSIS — K589 Irritable bowel syndrome without diarrhea: Secondary | ICD-10-CM | POA: Diagnosis present

## 2018-02-23 DIAGNOSIS — Z8349 Family history of other endocrine, nutritional and metabolic diseases: Secondary | ICD-10-CM

## 2018-02-23 DIAGNOSIS — K219 Gastro-esophageal reflux disease without esophagitis: Secondary | ICD-10-CM | POA: Diagnosis present

## 2018-02-23 DIAGNOSIS — E785 Hyperlipidemia, unspecified: Secondary | ICD-10-CM | POA: Diagnosis present

## 2018-02-23 DIAGNOSIS — Z7401 Bed confinement status: Secondary | ICD-10-CM | POA: Diagnosis not present

## 2018-02-23 DIAGNOSIS — I739 Peripheral vascular disease, unspecified: Secondary | ICD-10-CM | POA: Diagnosis present

## 2018-02-23 DIAGNOSIS — D509 Iron deficiency anemia, unspecified: Secondary | ICD-10-CM | POA: Diagnosis present

## 2018-02-23 DIAGNOSIS — Z885 Allergy status to narcotic agent status: Secondary | ICD-10-CM | POA: Diagnosis not present

## 2018-02-23 DIAGNOSIS — R9431 Abnormal electrocardiogram [ECG] [EKG]: Secondary | ICD-10-CM | POA: Diagnosis present

## 2018-02-23 DIAGNOSIS — F0390 Unspecified dementia without behavioral disturbance: Secondary | ICD-10-CM | POA: Diagnosis present

## 2018-02-23 DIAGNOSIS — R000 Tachycardia, unspecified: Secondary | ICD-10-CM | POA: Diagnosis not present

## 2018-02-23 DIAGNOSIS — J441 Chronic obstructive pulmonary disease with (acute) exacerbation: Secondary | ICD-10-CM | POA: Diagnosis present

## 2018-02-23 DIAGNOSIS — J432 Centrilobular emphysema: Secondary | ICD-10-CM | POA: Diagnosis present

## 2018-02-23 DIAGNOSIS — R0902 Hypoxemia: Secondary | ICD-10-CM | POA: Diagnosis not present

## 2018-02-23 DIAGNOSIS — Z923 Personal history of irradiation: Secondary | ICD-10-CM

## 2018-02-23 DIAGNOSIS — J9601 Acute respiratory failure with hypoxia: Secondary | ICD-10-CM | POA: Diagnosis present

## 2018-02-23 DIAGNOSIS — Z8711 Personal history of peptic ulcer disease: Secondary | ICD-10-CM

## 2018-02-23 DIAGNOSIS — E876 Hypokalemia: Secondary | ICD-10-CM | POA: Diagnosis present

## 2018-02-23 DIAGNOSIS — J189 Pneumonia, unspecified organism: Secondary | ICD-10-CM | POA: Diagnosis present

## 2018-02-23 DIAGNOSIS — R7989 Other specified abnormal findings of blood chemistry: Secondary | ICD-10-CM | POA: Diagnosis not present

## 2018-02-23 DIAGNOSIS — R0602 Shortness of breath: Secondary | ICD-10-CM | POA: Diagnosis not present

## 2018-02-23 DIAGNOSIS — C3490 Malignant neoplasm of unspecified part of unspecified bronchus or lung: Secondary | ICD-10-CM | POA: Diagnosis present

## 2018-02-23 DIAGNOSIS — A403 Sepsis due to Streptococcus pneumoniae: Secondary | ICD-10-CM | POA: Diagnosis not present

## 2018-02-23 DIAGNOSIS — R778 Other specified abnormalities of plasma proteins: Secondary | ICD-10-CM | POA: Diagnosis present

## 2018-02-23 DIAGNOSIS — I509 Heart failure, unspecified: Secondary | ICD-10-CM | POA: Diagnosis not present

## 2018-02-23 LAB — URINALYSIS, ROUTINE W REFLEX MICROSCOPIC
Bilirubin Urine: NEGATIVE
GLUCOSE, UA: NEGATIVE mg/dL
Ketones, ur: NEGATIVE mg/dL
Leukocytes,Ua: NEGATIVE
Nitrite: NEGATIVE
PROTEIN: NEGATIVE mg/dL
Specific Gravity, Urine: 1.013 (ref 1.005–1.030)
pH: 5 (ref 5.0–8.0)

## 2018-02-23 LAB — CBC WITH DIFFERENTIAL/PLATELET
Abs Immature Granulocytes: 0.08 10*3/uL — ABNORMAL HIGH (ref 0.00–0.07)
Basophils Absolute: 0 10*3/uL (ref 0.0–0.1)
Basophils Relative: 0 %
Eosinophils Absolute: 0.9 10*3/uL — ABNORMAL HIGH (ref 0.0–0.5)
Eosinophils Relative: 4 %
HCT: 38.9 % (ref 36.0–46.0)
Hemoglobin: 11.3 g/dL — ABNORMAL LOW (ref 12.0–15.0)
IMMATURE GRANULOCYTES: 0 %
Lymphocytes Relative: 7 %
Lymphs Abs: 1.4 10*3/uL (ref 0.7–4.0)
MCH: 20.6 pg — ABNORMAL LOW (ref 26.0–34.0)
MCHC: 29 g/dL — ABNORMAL LOW (ref 30.0–36.0)
MCV: 70.9 fL — ABNORMAL LOW (ref 80.0–100.0)
Monocytes Absolute: 0.4 10*3/uL (ref 0.1–1.0)
Monocytes Relative: 2 %
NEUTROS PCT: 87 %
Neutro Abs: 17.3 10*3/uL — ABNORMAL HIGH (ref 1.7–7.7)
Platelets: 394 10*3/uL (ref 150–400)
RBC: 5.49 MIL/uL — ABNORMAL HIGH (ref 3.87–5.11)
RDW: 19.2 % — ABNORMAL HIGH (ref 11.5–15.5)
WBC: 20.1 10*3/uL — ABNORMAL HIGH (ref 4.0–10.5)
nRBC: 0 % (ref 0.0–0.2)

## 2018-02-23 LAB — COMPREHENSIVE METABOLIC PANEL
ALT: 36 U/L (ref 0–44)
AST: 40 U/L (ref 15–41)
Albumin: 3.5 g/dL (ref 3.5–5.0)
Alkaline Phosphatase: 79 U/L (ref 38–126)
Anion gap: 15 (ref 5–15)
BUN: 32 mg/dL — AB (ref 8–23)
CO2: 24 mmol/L (ref 22–32)
Calcium: 8.6 mg/dL — ABNORMAL LOW (ref 8.9–10.3)
Chloride: 97 mmol/L — ABNORMAL LOW (ref 98–111)
Creatinine, Ser: 0.99 mg/dL (ref 0.44–1.00)
GFR calc Af Amer: >60 mL/min (ref >60)
GFR calc non Af Amer: 53 mL/min — ABNORMAL LOW (ref >60)
Glucose, Bld: 135 mg/dL — ABNORMAL HIGH (ref 70–99)
Potassium: 2.5 mmol/L — CL (ref 3.5–5.1)
Sodium: 136 mmol/L (ref 135–145)
Total Bilirubin: 1.1 mg/dL (ref 0.3–1.2)
Total Protein: 7.4 g/dL (ref 6.5–8.1)

## 2018-02-23 LAB — BRAIN NATRIURETIC PEPTIDE: B Natriuretic Peptide: 408 pg/mL — ABNORMAL HIGH (ref 0.0–100.0)

## 2018-02-23 LAB — TROPONIN I: TROPONIN I: 0.16 ng/mL — AB (ref <0.03)

## 2018-02-23 LAB — LACTIC ACID, PLASMA: Lactic Acid, Venous: 2.7 mmol/L (ref 0.5–1.9)

## 2018-02-23 MED ORDER — MAGNESIUM SULFATE 2 GM/50ML IV SOLN
2.0000 g | Freq: Once | INTRAVENOUS | Status: AC
  Administered 2018-02-24: 2 g via INTRAVENOUS
  Filled 2018-02-23: qty 50

## 2018-02-23 MED ORDER — DILTIAZEM HCL 25 MG/5ML IV SOLN
INTRAVENOUS | Status: AC
  Administered 2018-02-23: 10 mg
  Filled 2018-02-23: qty 5

## 2018-02-23 MED ORDER — SODIUM CHLORIDE 0.9% FLUSH
3.0000 mL | Freq: Two times a day (BID) | INTRAVENOUS | Status: DC
  Administered 2018-02-24 – 2018-03-02 (×11): 3 mL via INTRAVENOUS

## 2018-02-23 MED ORDER — LEVOFLOXACIN IN D5W 750 MG/150ML IV SOLN
750.0000 mg | Freq: Once | INTRAVENOUS | Status: AC
  Administered 2018-02-23: 750 mg via INTRAVENOUS
  Filled 2018-02-23: qty 150

## 2018-02-23 MED ORDER — ADENOSINE 6 MG/2ML IV SOLN
INTRAVENOUS | Status: AC
  Administered 2018-02-23: 22:00:00 via INTRAVENOUS
  Filled 2018-02-23: qty 2

## 2018-02-23 MED ORDER — SODIUM CHLORIDE 0.9 % IV BOLUS
500.0000 mL | Freq: Once | INTRAVENOUS | Status: AC
  Administered 2018-02-23: 500 mL via INTRAVENOUS

## 2018-02-23 MED ORDER — SODIUM CHLORIDE 0.9 % IV BOLUS
500.0000 mL | Freq: Once | INTRAVENOUS | Status: AC
  Administered 2018-02-24: 500 mL via INTRAVENOUS

## 2018-02-23 MED ORDER — ALPRAZOLAM 0.5 MG PO TABS
0.5000 mg | ORAL_TABLET | Freq: Two times a day (BID) | ORAL | Status: DC | PRN
  Administered 2018-02-24 – 2018-02-28 (×5): 0.5 mg via ORAL
  Filled 2018-02-23 (×5): qty 1

## 2018-02-23 MED ORDER — POTASSIUM CHLORIDE CRYS ER 20 MEQ PO TBCR: 20.0000 meq | EXTENDED_RELEASE_TABLET | Freq: Once | ORAL | Status: DC

## 2018-02-23 MED ORDER — POTASSIUM CHLORIDE 10 MEQ/100ML IV SOLN: 10.0000 meq | INTRAVENOUS | Status: DC

## 2018-02-23 MED ORDER — HEPARIN SODIUM (PORCINE) 5000 UNIT/ML IJ SOLN
5000.0000 [IU] | Freq: Three times a day (TID) | INTRAMUSCULAR | Status: DC
  Administered 2018-02-24 – 2018-02-25 (×5): 5000 [IU] via SUBCUTANEOUS
  Filled 2018-02-23 (×5): qty 1

## 2018-02-23 MED ORDER — ALBUTEROL (5 MG/ML) CONTINUOUS INHALATION SOLN
10.0000 mg/h | INHALATION_SOLUTION | RESPIRATORY_TRACT | Status: DC
  Administered 2018-02-23: 10 mg/h via RESPIRATORY_TRACT
  Filled 2018-02-23: qty 20

## 2018-02-23 MED ORDER — POTASSIUM CHLORIDE IN NACL 40-0.9 MEQ/L-% IV SOLN
INTRAVENOUS | Status: DC
  Administered 2018-02-24: 75 mL/h via INTRAVENOUS
  Filled 2018-02-23 (×2): qty 1000

## 2018-02-23 MED ORDER — SODIUM CHLORIDE 0.9 % IV SOLN
2.0000 g | Freq: Three times a day (TID) | INTRAVENOUS | Status: DC
  Administered 2018-02-24 – 2018-03-02 (×20): 2 g via INTRAVENOUS
  Filled 2018-02-23 (×25): qty 2

## 2018-02-23 MED ORDER — MOMETASONE FURO-FORMOTEROL FUM 200-5 MCG/ACT IN AERO
2.0000 | INHALATION_SPRAY | Freq: Two times a day (BID) | RESPIRATORY_TRACT | Status: DC
  Administered 2018-02-24 – 2018-03-02 (×13): 2 via RESPIRATORY_TRACT
  Filled 2018-02-23: qty 8.8

## 2018-02-23 MED ORDER — METHYLPREDNISOLONE SODIUM SUCC 125 MG IJ SOLR
60.0000 mg | Freq: Four times a day (QID) | INTRAMUSCULAR | Status: DC
  Administered 2018-02-24 – 2018-02-25 (×6): 60 mg via INTRAVENOUS
  Filled 2018-02-23 (×7): qty 2

## 2018-02-23 MED ORDER — LEVALBUTEROL HCL 0.63 MG/3ML IN NEBU: 0.6300 mg | INHALATION_SOLUTION | Freq: Four times a day (QID) | RESPIRATORY_TRACT | Status: DC | PRN

## 2018-02-23 MED ORDER — POTASSIUM CHLORIDE 10 MEQ/100ML IV SOLN
10.0000 meq | INTRAVENOUS | Status: AC
  Administered 2018-02-23 – 2018-02-24 (×3): 10 meq via INTRAVENOUS
  Filled 2018-02-23 (×3): qty 100

## 2018-02-23 MED ORDER — OXYBUTYNIN CHLORIDE 5 MG PO TABS
5.0000 mg | ORAL_TABLET | Freq: Four times a day (QID) | ORAL | Status: DC
  Administered 2018-02-24 – 2018-03-02 (×24): 5 mg via ORAL
  Filled 2018-02-23 (×35): qty 1

## 2018-02-23 MED ORDER — ACETAMINOPHEN 325 MG PO TABS: 650.0000 mg | ORAL_TABLET | Freq: Four times a day (QID) | ORAL | Status: DC | PRN

## 2018-02-23 MED ORDER — ACETAMINOPHEN 650 MG RE SUPP: 650.0000 mg | Freq: Four times a day (QID) | RECTAL | Status: DC | PRN

## 2018-02-23 MED ORDER — ASPIRIN EC 81 MG PO TBEC
162.0000 mg | DELAYED_RELEASE_TABLET | Freq: Every day | ORAL | Status: DC
  Administered 2018-02-24 – 2018-02-25 (×2): 162 mg via ORAL
  Filled 2018-02-23 (×2): qty 2

## 2018-02-23 MED ORDER — VANCOMYCIN HCL IN DEXTROSE 1-5 GM/200ML-% IV SOLN
1000.0000 mg | Freq: Once | INTRAVENOUS | Status: AC
  Administered 2018-02-23: 1000 mg via INTRAVENOUS
  Filled 2018-02-23: qty 200

## 2018-02-23 NOTE — H&P (Signed)
History and Physical    [name] YNW:[id number] DOB: [birth year] DOA: 02/23/2018  PCP: Claretta Fraise, MD   Patient coming from: Home   Chief Complaint: Acute respiratory distress   HPI: [name] is a 85 y.o. female with medical history significant for dementia, emphysema, peripheral arterial disease, and lung cancer status post radiation, now presenting to the emergency department for evaluation of acute respiratory distress.  Daughter is at the bedside and assists with the history.  Patient had recently been started on ciprofloxacin for suspected UTI, and then started on azithromycin 2 weeks ago for a respiratory illness.  She seemed to be improving in recent days before acutely worsening today.  Patient was noted to have labored respirations, seemed to be gasping for breath per report of her daughter, and EMS was called.  Patient was given 125 mg of IV Solu-Medrol, multiple neb treatments, and started on CPAP.  She was brought into the ED.  Family reports that the patient has chronic lower extremity edema that seems essentially unchanged.  Patient shakes her head "no" when asked about chest pain.  Daughter is aware that the patient is critically ill and would require intubation if she does not improve, or if she worsens on BiPAP; daughter confirms that patient is full code.  ED Course: Upon arrival to the ED, patient is found to be afebrile, saturating adequately on BiPAP, tachypneic in the 30s, tachycardic in the 120s, and with blood pressure low 100s.  EKG features sinus tachycardia with rate 118 and QTc interval of 571 ms.  Chest x-ray is notable for diffuse increase in interstitial markings with patchy left midlung and right basilar opacities concerning for edema versus infection.  Chemistry panel is notable for a potassium of 2.5.  CBC features a leukocytosis to 20,100.  Lactic acid is elevated to 2.7.  Troponin is elevated to 0.16.  BNP is elevated at 408.  Blood cultures were  collected, 500 cc normal saline administered, and the patient was treated with Levaquin and vancomycin in the ED.  She was also given 30 mEq IV potassium and albuterol neb.  She went into SVT with a rate in the 150s, was given a dose of adenosine, and a dose of IV diltiazem.  Rate improved to the low 100s, SBP has been in the 90s.  ED physician discussed the case with critical care who felt that patient does not require ICU and hospitalists were asked to admit.  Review of Systems:  All other systems reviewed and apart from HPI, are negative.  Past Medical History:  Diagnosis Date  . Anxiety disorder   . Cancer ([place])   . Colon adenoma   . COPD (chronic obstructive pulmonary disease) ([place])   . Depression   . Fall at home May 17, 2012   Pt. fell in tub today  . GERD (gastroesophageal reflux disease)   . Hyperlipidemia   . Irritable bowel syndrome   . Lymphocytic colitis 12/15/2011  . Peptic ulcer disease   . Pneumonia 2007   prolonged ICU stay on vent with trach  . Pseudotumor cerebri   . Pulmonary embolism ([place]) 2004   off anticoagulation  . Radiation 4/13,4/15,04/30/14   Left lower lung    Past Surgical History:  Procedure Laterality Date  . ABDOMINAL AORTAGRAM N/A 08/10/2012   Procedure: ABDOMINAL Maxcine Ham;  Surgeon: Elam Dutch, MD;  [facility] C[facility] LAB;  Service: Cardiovascular;  Laterality: N/A;  . ABDOMINAL HYSTERECTOMY    . BRAIN SURGERY  10 YEARS AGO   . CATARACT EXTRACTION     BOTH EYES  . CHOLECYSTECTOMY    . COLON RESECTION    . COLONOSCOPY    . INSERTION OF ILIAC STENT Bilateral 08/10/2012   Procedure: INSERTION OF ILIAC STENT;  Surgeon: Elam Dutch, MD;  Location: [facility] CATH LAB;  Service: Cardiovascular;  Laterality: Bilateral;  . Lower extremity stents     Per Dr. Oneida Alar  . UPPER GASTROINTESTINAL ENDOSCOPY       reports that she has been smoking cigarettes. She has a 30.00 pack-year smoking history. She has never used smokeless tobacco. She reports  that she does not drink alcohol or use drugs.  Allergies  Allergen Reactions  . Codeine Anaphylaxis  . Penicillins Anaphylaxis  . Sulfa Antibiotics Anaphylaxis    Family History  Problem Relation Age of Onset  . Lung cancer Father   . Cancer Mother   . Hyperlipidemia Mother   . Hypertension Mother   . Heart attack Mother   . Hyperlipidemia Sister   . Colon cancer Neg Hx      Prior to Admission medications   Medication Sig Start Date End Date Taking? Authorizing Provider  albuterol (PROVENTIL HFA;VENTOLIN HFA) 108 (90 Base) MCG/ACT inhaler Inhale 2 puffs into the lungs every 6 (six) hours as needed for wheezing or shortness of breath. 02/05/18  Yes Stacks, Cletus Gash, MD  ALPRAZolam Duanne Moron) 0.5 MG tablet TAKE (1) TABLET TWICE A DAY. 02/05/18  Yes Claretta Fraise, MD  aspirin EC 325 MG tablet Take 325 mg by mouth daily.   Yes [provider]  budesonide-formoterol (SYMBICORT) 160-4.5 MCG/ACT inhaler Inhale 2 puffs into the lungs 2 (two) times daily. 02/05/18  Yes Stacks, Cletus Gash, MD  Cholecalciferol (VITAMIN D) 2000 UNITS tablet Take 2,000 Units by mouth daily.   Yes [provider]  clotrimazole (LOTRIMIN) 1 % cream Apply 1 application topically 2 (two) times daily. x2 weeks (start AFTER pill) 09/19/17  Yes Gottschalk, Ashly M, DO  CRANBERRY PO Take 1 tablet by mouth daily.   Yes [provider]  donepezil (ARICEPT) 5 MG tablet Take 1 tablet (5 mg total) by mouth at bedtime. 02/05/18  Yes Stacks, Cletus Gash, MD  furosemide (LASIX) 20 MG tablet Take 1 tablet (20 mg total) by mouth daily. 02/05/18  Yes Stacks, Cletus Gash, MD  nitrofurantoin, macrocrystal-monohydrate, (MACROBID) 100 MG capsule Take 1 capsule (100 mg total) by mouth 2 (two) times daily. 02/09/18  Yes Claretta Fraise, MD  oxybutynin (DITROPAN) 5 MG tablet Take 1 tablet (5 mg total) by mouth 4 (four) times daily. 02/05/18  Yes Stacks, Cletus Gash, MD  pantoprazole (PROTONIX) 40 MG tablet Take 1 tablet (40 mg total) by mouth  daily. 02/05/18  Yes Stacks, Cletus Gash, MD  potassium chloride (K-DUR,KLOR-CON) 10 MEQ tablet Take 1 tablet (10 mEq total) by mouth daily. As a potassium supplement 02/05/18  Yes Stacks, Cletus Gash, MD  Triamcinolone Acetonide (TRIAMCINOLONE 0.1 % CREAM : EUCERIN) CREA Apply 1 application topically 3 (three) times daily. 03/15/16  Yes Wardell Honour, MD  azithromycin (ZITHROMAX Z-PAK) 250 MG tablet Take two right away Then one a day for the next 4 days. 02/05/18   Claretta Fraise, MD  ciprofloxacin (CIPRO) 250 MG tablet Take 1 tablet (250 mg total) by mouth 2 (two) times daily. 02/08/18   Terald Sleeper, PA-C    Physical Exam: Vitals:   02/23/18 2210 02/23/18 2300 02/23/18 2330 02/23/18 2336  BP: 99/63 (!) 92/50 (!) 108/55 (!) 108/55  Pulse:  96 (!) 102 97  Resp: (!) 30 (!) 26 (!) 23 (!) 27  Temp:      TempSrc:      SpO2: 100% 100% 100% 99%    Constitutional: acute respiratory distress with accessory muscle recruitment and tachypnea. No diaphoresis.  Eyes: PERTLA, lids and conjunctivae normal ENMT: Mucous membranes are moist. Posterior pharynx clear of any exudate or lesions.   Neck: normal, supple, no masses, no thyromegaly Respiratory: Labored respirations. Prolonged expiratory phase. Scattered rhonchi. No pallor or cyanosis.  Cardiovascular: Rate ~120 and regular. Pedal edema bilaterally. Abdomen: No distension, no tenderness, soft. Bowel sounds normal.  Musculoskeletal: no clubbing / cyanosis. No joint deformity upper and lower extremities.    Skin: no significant rashes, lesions, ulcers. Warm, dry, well-perfused. Neurologic: No gross facial asymmetry. Sensation intact. Moving all extremities.  Psychiatric: Somnolent, easily roused, shakes head "yes" or "no" to basic questions.    Labs on Admission: I have personally reviewed following labs and imaging studies  CBC: Recent Labs  Lab 02/23/18 2005  WBC 20.1*  NEUTROABS 17.3*  HGB 11.3*  HCT 38.9  MCV 70.9*  PLT 099   Basic  Metabolic Panel: Recent Labs  Lab 02/23/18 2005  NA 136  K 2.5*  CL 97*  CO2 24  GLUCOSE 135*  BUN 32*  CREATININE 0.99  CALCIUM 8.6*   GFR: CrCl cannot be calculated (Unknown ideal weight.). Liver Function Tests: Recent Labs  Lab 02/23/18 2005  AST 40  ALT 36  ALKPHOS 79  BILITOT 1.1  PROT 7.4  ALBUMIN 3.5   No results for input(s): LIPASE, AMYLASE in the last 168 hours. No results for input(s): AMMONIA in the last 168 hours. Coagulation Profile: No results for input(s): INR, PROTIME in the last 168 hours. Cardiac Enzymes: Recent Labs  Lab 02/23/18 2005  TROPONINI 0.16*   BNP (last 3 results) No results for input(s): PROBNP in the last 8760 hours. HbA1C: No results for input(s): HGBA1C in the last 72 hours. CBG: No results for input(s): GLUCAP in the last 168 hours. Lipid Profile: No results for input(s): CHOL, HDL, LDLCALC, TRIG, CHOLHDL, LDLDIRECT in the last 72 hours. Thyroid Function Tests: No results for input(s): TSH, T4TOTAL, FREET4, T3FREE, THYROIDAB in the last 72 hours. Anemia Panel: No results for input(s): VITAMINB12, FOLATE, FERRITIN, TIBC, IRON, RETICCTPCT in the last 72 hours. Urine analysis:    Component Value Date/Time   COLORURINE YELLOW 02/23/2018 2041   APPEARANCEUR CLEAR 02/23/2018 2041   APPEARANCEUR Cloudy (A) 12/26/2017 1735   LABSPEC 1.013 02/23/2018 2041   PHURINE 5.0 02/23/2018 2041   GLUCOSEU NEGATIVE 02/23/2018 2041   HGBUR MODERATE (A) 02/23/2018 2041   BILIRUBINUR NEGATIVE 02/23/2018 2041   BILIRUBINUR Negative 12/26/2017 [place] 02/23/2018 2041   PROTEINUR NEGATIVE 02/23/2018 2041   UROBILINOGEN negative 07/31/2013 1123   NITRITE NEGATIVE 02/23/2018 2041   LEUKOCYTESUR NEGATIVE 02/23/2018 2041   Sepsis Labs: @LABRCNTIP (procalcitonin:4,lacticidven:4) ) Recent Results (from the past 240 hour(s))  Culture, blood (Routine X 2) w Reflex to ID Panel     Status: None (Preliminary result)   Collection  Time: 02/23/18  8:03 PM  Result Value Ref Range Status   Specimen Description LEFT ANTECUBITAL  Final   Special Requests   Final    BOTTLES DRAWN AEROBIC AND ANAEROBIC Blood Culture adequate volume Performed at [facility], [address], [place], [place] [address]    Culture PENDING  Incomplete   Report Status PENDING  Incomplete  Culture, blood (  Routine X 2) w Reflex to ID Panel     Status: None (Preliminary result)   Collection Time: 02/23/18  8:33 PM  Result Value Ref Range Status   Specimen Description BLOOD LEFT HAND  Final   Special Requests   Final    BOTTLES DRAWN AEROBIC AND ANAEROBIC Blood Culture adequate volume Performed at [facility], [address], [place], [place] [address]    Culture PENDING  Incomplete   Report Status PENDING  Incomplete     Radiological Exams on Admission: Dg Chest Port 1 View  Result Date: 02/23/2018 CLINICAL DATA:  Shortness of breath. EXAM: PORTABLE CHEST 1 VIEW COMPARISON:  08/24/2016 FINDINGS: The patient is slightly rotated to the left with unchanged cardiomediastinal silhouette. The lungs are hyperinflated. Chronic density is again seen in the left mid upper lung. There is additional superimposed patchy left midlung opacity which is new, and there is also patchy right basilar opacity. The interstitial markings are increased diffusely compared to the prior study. No definite pleural effusion or pneumothorax is identified. IMPRESSION: Diffusely increased interstitial markings with patchy left midlung and right basilar opacities which may reflect infection or edema. Electronically Signed   By: Logan Bores M.D.   On: 02/23/2018 21:04    EKG: Independently reviewed. Sinus tachycardia (rate 118), QTc 571 ms.   Assessment/Plan   1. Sepsis secondary to PNA  - Presents in acute respiratory distress; found to have leukocytosis, tachycardia, elevated lactate, and CXR concerning for PNA  - Blood cultures collected in ED, 500 cc NS given, and empiric  antibiotics started with Levaquin and vancomycin  - She is requiring BiPAP on admission  - Check sputum culture, strep pneumo antigen, and influenza PCR  - Continue empiric antibiotics with vancomycin and aztreonam given critical illness with recent azithromycin and Cipro use, and prolonged QT   2. COPD with acute exacerbation  - Precipitated by infection  - Treated with nebs and IV Solu-Medrol by EMS pta  - Requiring BiPAP in ED  - Check sputum culture, continue systemic steroids, continue abx as above, BiPAP as needed, Xopenex nebs given SVT after albuterol    3. Elevated troponin  - Troponin is 0.16 in ED  - Patient denies chest pain  - Likely demand ischemia in setting of acute resp distress and SVT  - Continue cardiac monitoring, continue ASA, trend troponin, treat underlying illness    4. Hypokalemia  - Serum potassium is 2.5 in ED; albuterol nebs likely contributing  - EKG with prolonged QT, went into SVT in ED that resolved after adenosine and diltiazem   - Continue potassium replacement, empiric mag replacement, continue cardiac monitoring    5. Prolonged QT interval  - QTc is 571 ms in ED - Replace potassium to 4 and magnesium to 2, minimize QT-prolonging medications, continue cardiac monitoring, and repeat EKG in am    6. Dementia  - Aricept in light of QT prolongation     DVT prophylaxis: sq heparin  Code Status: Full, confirmed with daughter  Family Communication: Daughter updated at bedside Consults called: PCCM  Admission status: Inpatient     Vianne Bulls, MD [facility] Pager [phone]  If 7PM-7AM, please contact night-coverage [email or web address] Password [facility]  02/23/2018, 11:45 PM

## 2018-02-23 NOTE — ED Triage Notes (Addendum)
Pt in from home via [facility], found pt to be in resp distress.  Unknown if fever.  Pt has received 7.5 mg albuterol and 0.5 atrovent via several nebs with some improvement, 125 solumedrol iv. Pt is awake and alert, denies pain

## 2018-02-23 NOTE — ED Provider Notes (Signed)
[facility] EMERGENCY DEPARTMENT Provider Note   CSN: [id number] Arrival date & time: 02/23/18  1951     History   Chief Complaint Chief Complaint  Patient presents with  . Respiratory Distress    HPI [name] is a 85 y.o. female.  HPI Patient coming from home with respiratory distress.  Patient has history of dementia and is unable to contribute significantly to history.  Per EMS patient was found to be hypoxic in the 70s with cyanosis and increased work of breathing.  Received Solu-Medrol and 3 breathing treatments in route with some improvement of her work of breathing.  EMS states that per family patient has had gradual decline of the last 3 weeks and then suddenly worsening shortness of breath today.  Per EMS patient has a history of lung cancer for which she is not being treated.  She is not chronically on any oxygen. Past Medical History:  Diagnosis Date  . Anxiety disorder   . Cancer ([place])   . Colon adenoma   . COPD (chronic obstructive pulmonary disease) ([place])   . Depression   . Fall at home May 17, 2012   Pt. fell in tub today  . GERD (gastroesophageal reflux disease)   . Hyperlipidemia   . Irritable bowel syndrome   . Lymphocytic colitis 12/15/2011  . Peptic ulcer disease   . Pneumonia 2007   prolonged ICU stay on vent with trach  . Pseudotumor cerebri   . Pulmonary embolism ([place]) 2004   off anticoagulation  . Radiation 4/13,4/15,04/30/14   Left lower lung    Patient Active Problem List   Diagnosis Date Noted  . Acute respiratory distress 02/23/2018  . Current smoker 09/05/2017  . Stress incontinence of urine 05/23/2016  . Venous stasis of both lower extremities 03/03/2016  . Lung cancer ([place]) 11/14/2014  . Hyperlipidemia with target LDL less than 100 11/14/2014  . Centrilobular emphysema ([place]) 10/08/2013  . Protein-calorie malnutrition, severe ([place]) 06/21/2013  . Peripheral vascular disease ([place]) 05/17/2012  . Pain in limb 05/17/2012  . Edema  02/16/2012  . IBS (irritable bowel syndrome) 02/09/2012  . Lymphocytic colitis 12/15/2011  . Personal history of adenomatous colonic polyps 12/02/2011  . GERD 10/20/2008    Past Surgical History:  Procedure Laterality Date  . ABDOMINAL AORTAGRAM N/A 08/10/2012   Procedure: ABDOMINAL Maxcine Ham;  Surgeon: Elam Dutch, MD;  Location: [facility] CATH LAB;  Service: Cardiovascular;  Laterality: N/A;  . ABDOMINAL HYSTERECTOMY    . BRAIN SURGERY     10 YEARS AGO   . CATARACT EXTRACTION     BOTH EYES  . CHOLECYSTECTOMY    . COLON RESECTION    . COLONOSCOPY    . INSERTION OF ILIAC STENT Bilateral 08/10/2012   Procedure: INSERTION OF ILIAC STENT;  Surgeon: Elam Dutch, MD;  Location: [facility] CATH LAB;  Service: Cardiovascular;  Laterality: Bilateral;  . Lower extremity stents     Per Dr. Oneida Alar  . UPPER GASTROINTESTINAL ENDOSCOPY       OB History   No obstetric history on file.      Home Medications    Prior to Admission medications   Medication Sig Start Date End Date Taking? Authorizing Provider  albuterol (PROVENTIL HFA;VENTOLIN HFA) 108 (90 Base) MCG/ACT inhaler Inhale 2 puffs into the lungs every 6 (six) hours as needed for wheezing or shortness of breath. 02/05/18  Yes Stacks, Cletus Gash, MD  ALPRAZolam Duanne Moron) 0.5 MG tablet TAKE (1) TABLET TWICE A DAY. 02/05/18  Yes Claretta Fraise, MD  aspirin EC 325 MG tablet Take 325 mg by mouth daily.   Yes [provider]  budesonide-formoterol (SYMBICORT) 160-4.5 MCG/ACT inhaler Inhale 2 puffs into the lungs 2 (two) times daily. 02/05/18  Yes Stacks, Cletus Gash, MD  Cholecalciferol (VITAMIN D) 2000 UNITS tablet Take 2,000 Units by mouth daily.   Yes [provider]  clotrimazole (LOTRIMIN) 1 % cream Apply 1 application topically 2 (two) times daily. x2 weeks (start AFTER pill) 09/19/17  Yes Gottschalk, Ashly M, DO  CRANBERRY PO Take 1 tablet by mouth daily.   Yes [provider]  donepezil (ARICEPT) 5 MG tablet Take 1 tablet (5 mg  total) by mouth at bedtime. 02/05/18  Yes Stacks, Cletus Gash, MD  furosemide (LASIX) 20 MG tablet Take 1 tablet (20 mg total) by mouth daily. 02/05/18  Yes Stacks, Cletus Gash, MD  nitrofurantoin, macrocrystal-monohydrate, (MACROBID) 100 MG capsule Take 1 capsule (100 mg total) by mouth 2 (two) times daily. 02/09/18  Yes Claretta Fraise, MD  oxybutynin (DITROPAN) 5 MG tablet Take 1 tablet (5 mg total) by mouth 4 (four) times daily. 02/05/18  Yes Stacks, Cletus Gash, MD  pantoprazole (PROTONIX) 40 MG tablet Take 1 tablet (40 mg total) by mouth daily. 02/05/18  Yes Stacks, Cletus Gash, MD  potassium chloride (K-DUR,KLOR-CON) 10 MEQ tablet Take 1 tablet (10 mEq total) by mouth daily. As a potassium supplement 02/05/18  Yes Stacks, Cletus Gash, MD  Triamcinolone Acetonide (TRIAMCINOLONE 0.1 % CREAM : EUCERIN) CREA Apply 1 application topically 3 (three) times daily. 03/15/16  Yes Wardell Honour, MD  azithromycin (ZITHROMAX Z-PAK) 250 MG tablet Take two right away Then one a day for the next 4 days. 02/05/18   Claretta Fraise, MD  ciprofloxacin (CIPRO) 250 MG tablet Take 1 tablet (250 mg total) by mouth 2 (two) times daily. 02/08/18   Terald Sleeper, PA-C    Family History Family History  Problem Relation Age of Onset  . Lung cancer Father   . Cancer Mother   . Hyperlipidemia Mother   . Hypertension Mother   . Heart attack Mother   . Hyperlipidemia Sister   . Colon cancer Neg Hx     Social History Social History   Tobacco Use  . Smoking status: Current Every Day Smoker    Packs/day: 0.50    Years: 60.00    Pack years: 30.00    Types: Cigarettes  . Smokeless tobacco: Never Used  . Tobacco comment: 2/3 pks per day  Substance Use Topics  . Alcohol use: No    Alcohol/week: 0.0 standard drinks  . Drug use: No     Allergies   Codeine; Penicillins; and Sulfa antibiotics   Review of Systems Review of Systems  Unable to perform ROS: Dementia     Physical Exam Updated Vital Signs BP (!) 108/58   Pulse (!) 120    Temp 98 F (36.7 C) (Tympanic)   Resp (!) 6   SpO2 99%   Physical Exam Vitals signs and nursing note reviewed.  Constitutional:      General: She is in acute distress.     Appearance: She is well-developed. She is ill-appearing.  HENT:     Head: Normocephalic and atraumatic.     Nose: Nose normal.     Mouth/Throat:     Mouth: Mucous membranes are moist.  Eyes:     Extraocular Movements: Extraocular movements intact.     Pupils: Pupils are equal, round, and reactive to light.  Neck:  Musculoskeletal: Normal range of motion and neck supple. No neck rigidity or muscular tenderness.  Cardiovascular:     Rate and Rhythm: Regular rhythm. Tachycardia present.     Heart sounds: No murmur. No friction rub. No gallop.   Pulmonary:     Effort: Respiratory distress present.     Breath sounds: Wheezing present.     Comments: Expiratory wheezes throughout.  Increased work of breathing.  Use of accessory muscles. Abdominal:     General: Bowel sounds are normal.     Palpations: Abdomen is soft.     Tenderness: There is no abdominal tenderness. There is no guarding or rebound.  Musculoskeletal: Normal range of motion.        General: No swelling, tenderness, deformity or signs of injury.  Lymphadenopathy:     Cervical: No cervical adenopathy.  Skin:    General: Skin is warm and dry.     Capillary Refill: Capillary refill takes less than 2 seconds.     Findings: No erythema or rash.  Neurological:     General: No focal deficit present.     Mental Status: She is alert.     Comments: Following simple commands.  Moving all extremities without focal deficit.  Speaks in several word sentences.  Sensation grossly intact.  Psychiatric:        Behavior: Behavior normal.      ED Treatments / Results  Labs (all labs ordered are listed, but only abnormal results are displayed) Labs Reviewed  CBC WITH DIFFERENTIAL/PLATELET - Abnormal; Notable for the following components:      Result  Value   WBC 20.1 (*)    RBC 5.49 (*)    Hemoglobin 11.3 (*)    MCV 70.9 (*)    MCH 20.6 (*)    MCHC 29.0 (*)    RDW 19.2 (*)    Neutro Abs 17.3 (*)    Eosinophils Absolute 0.9 (*)    Abs Immature Granulocytes 0.08 (*)    All other components within normal limits  COMPREHENSIVE METABOLIC PANEL - Abnormal; Notable for the following components:   Potassium 2.5 (*)    Chloride 97 (*)    Glucose, Bld 135 (*)    BUN 32 (*)    Calcium 8.6 (*)    GFR calc non Af Amer 53 (*)    All other components within normal limits  LACTIC ACID, PLASMA - Abnormal; Notable for the following components:   Lactic Acid, Venous 2.7 (*)    All other components within normal limits  BRAIN NATRIURETIC PEPTIDE - Abnormal; Notable for the following components:   B Natriuretic Peptide 408.0 (*)    All other components within normal limits  TROPONIN I - Abnormal; Notable for the following components:   Troponin I 0.16 (*)    All other components within normal limits  URINALYSIS, ROUTINE W REFLEX MICROSCOPIC - Abnormal; Notable for the following components:   Hgb urine dipstick MODERATE (*)    Bacteria, UA RARE (*)    All other components within normal limits  CULTURE, BLOOD (ROUTINE X 2)  CULTURE, BLOOD (ROUTINE X 2)    EKG EKG Interpretation  Date/Time:  Friday February 23 2018 20:01:27 EST Ventricular Rate:  118 PR Interval:    QRS Duration: 86 QT Interval:  407 QTC Calculation: 571 R Axis:   84 Text Interpretation:  Sinus tachycardia Borderline right axis deviation Prolonged QT interval Confirmed by Julianne Rice [phone]) on 02/23/2018 8:20:14 PM   Radiology Dg Chest  Port 1 View  Result Date: 02/23/2018 CLINICAL DATA:  Shortness of breath. EXAM: PORTABLE CHEST 1 VIEW COMPARISON:  08/24/2016 FINDINGS: The patient is slightly rotated to the left with unchanged cardiomediastinal silhouette. The lungs are hyperinflated. Chronic density is again seen in the left mid upper lung. There is additional  superimposed patchy left midlung opacity which is new, and there is also patchy right basilar opacity. The interstitial markings are increased diffusely compared to the prior study. No definite pleural effusion or pneumothorax is identified. IMPRESSION: Diffusely increased interstitial markings with patchy left midlung and right basilar opacities which may reflect infection or edema. Electronically Signed   By: Logan Bores M.D.   On: 02/23/2018 21:04    Procedures Procedures (including critical care time)  Medications Ordered in ED Medications  albuterol (PROVENTIL,VENTOLIN) solution continuous neb (10 mg/hr Nebulization New Bag/Given 02/23/18 2012)  levofloxacin (LEVAQUIN) IVPB 750 mg (750 mg Intravenous New Bag/Given 02/23/18 2110)  vancomycin (VANCOCIN) IVPB 1000 mg/200 mL premix (1,000 mg Intravenous New Bag/Given 02/23/18 2127)  potassium chloride 10 mEq in 100 mL IVPB (10 mEq Intravenous New Bag/Given 02/23/18 2153)  sodium chloride 0.9 % bolus 500 mL (500 mLs Intravenous New Bag/Given 02/23/18 2110)  adenosine (ADENOCARD) 6 MG/2ML injection ( Intravenous Given 02/23/18 2145)  diltiazem (CARDIZEM) 25 MG/5ML injection (10 mg  Given 02/23/18 2152)   CRITICAL CARE Performed by: Julianne Rice Total critical care time: 45 minutes Critical care time was exclusive of separately billable procedures and treating other patients. Critical care was necessary to treat or prevent imminent or life-threatening deterioration. Critical care was time spent personally by me on the following activities: development of treatment plan with patient and/or surrogate as well as nursing, discussions with consultants, evaluation of patient's response to treatment, examination of patient, obtaining history from patient or surrogate, ordering and performing treatments and interventions, ordering and review of laboratory studies, ordering and review of radiographic studies, pulse oximetry and re-evaluation of patient's  condition.  Initial Impression / Assessment and Plan / ED Course  I have reviewed the triage vital signs and the nursing notes.  Pertinent labs & imaging results that were available during my care of the patient were reviewed by me and considered in my medical decision making (see chart for details).     Patient was placed on BiPAP with improvement of her work of breathing.  Discussion with daughter at bedside.  Patient has had gradual decline of the last few weeks which is been attributed to her dementia.  Increased worsening shortness of breath and work of breathing today.  Daughter states "please do not write her off.  I am not ready to let her go".  Mild initial tachycardia when the patient arrived.  Received nebulized treatment and heart rate went up into the 150s.  Was persistent despite discontinuation of the treatment.  Given dose of 6 mg of adenosine with conversion back to normal sinus rhythm which rapidly then progressed to narrow complex regular tachycardia at 150.  Patient tolerated well.  Discussed with Dr. Lezlie Lye.  Thinks tachycardia may be related to nebulized treatment and agrees with trial of single dose of Cardizem.  Does not believe the patient meets ICU criteria at this time.  Discussed with Dr. Myna Hidalgo.  He will see patient in the emergency department. Final Clinical Impressions(s) / ED Diagnoses   Final diagnoses:  Respiratory distress  Tachycardia  Hypokalemia    ED Discharge Orders    None  Julianne Rice, MD 02/23/18 2213

## 2018-02-23 NOTE — ED Notes (Signed)
Date and time results received: 02/23/18 20:45 (use smartphrase ".now" to insert current time)  Test: Lactic Acid 2.7 Critical Value:Lactic Acid  Name of Provider Notified: Lita Mains MD  Orders Received? Or Actions Taken?: Provider notified

## 2018-02-24 LAB — LACTIC ACID, PLASMA
Lactic Acid, Venous: 1.7 mmol/L (ref 0.5–1.9)
Lactic Acid, Venous: 3 mmol/L (ref 0.5–1.9)

## 2018-02-24 LAB — CBC WITH DIFFERENTIAL/PLATELET
ABS IMMATURE GRANULOCYTES: 0.09 10*3/uL — AB (ref 0.00–0.07)
Basophils Absolute: 0 10*3/uL (ref 0.0–0.1)
Basophils Relative: 0 %
Eosinophils Absolute: 0 10*3/uL (ref 0.0–0.5)
Eosinophils Relative: 0 %
HCT: 29.4 % — ABNORMAL LOW (ref 36.0–46.0)
Hemoglobin: 8.7 g/dL — ABNORMAL LOW (ref 12.0–15.0)
Immature Granulocytes: 1 %
Lymphocytes Relative: 3 %
Lymphs Abs: 0.3 10*3/uL — ABNORMAL LOW (ref 0.7–4.0)
MCH: 21.2 pg — AB (ref 26.0–34.0)
MCHC: 29.6 g/dL — ABNORMAL LOW (ref 30.0–36.0)
MCV: 71.7 fL — ABNORMAL LOW (ref 80.0–100.0)
Monocytes Absolute: 0.2 10*3/uL (ref 0.1–1.0)
Monocytes Relative: 1 %
Neutro Abs: 12.7 10*3/uL — ABNORMAL HIGH (ref 1.7–7.7)
Neutrophils Relative %: 95 %
Platelets: 317 10*3/uL (ref 150–400)
RBC: 4.1 MIL/uL (ref 3.87–5.11)
RDW: 18.3 % — ABNORMAL HIGH (ref 11.5–15.5)
WBC: 13.3 10*3/uL — ABNORMAL HIGH (ref 4.0–10.5)
nRBC: 0 % (ref 0.0–0.2)

## 2018-02-24 LAB — BASIC METABOLIC PANEL
Anion gap: 9 (ref 5–15)
BUN: 24 mg/dL — ABNORMAL HIGH (ref 8–23)
CO2: 21 mmol/L — ABNORMAL LOW (ref 22–32)
Calcium: 7.7 mg/dL — ABNORMAL LOW (ref 8.9–10.3)
Chloride: 106 mmol/L (ref 98–111)
Creatinine, Ser: 0.77 mg/dL (ref 0.44–1.00)
GFR calc Af Amer: >60 mL/min (ref >60)
GFR calc non Af Amer: >60 mL/min (ref >60)
Glucose, Bld: 235 mg/dL — ABNORMAL HIGH (ref 70–99)
POTASSIUM: 3.8 mmol/L (ref 3.5–5.1)
Sodium: 136 mmol/L (ref 135–145)

## 2018-02-24 LAB — IRON AND TIBC
Iron: 15 ug/dL — ABNORMAL LOW (ref 28–170)
Saturation Ratios: 5 % — ABNORMAL LOW (ref 10.4–31.8)
TIBC: 302 ug/dL (ref 250–450)
UIBC: 287 ug/dL

## 2018-02-24 LAB — FERRITIN: Ferritin: 71 ng/mL (ref 11–307)

## 2018-02-24 LAB — INFLUENZA PANEL BY PCR (TYPE A & B)
Influenza A By PCR: NEGATIVE
Influenza B By PCR: NEGATIVE

## 2018-02-24 LAB — RETICULOCYTES
Immature Retic Fract: 9.9 % (ref 2.3–15.9)
RBC.: 4.25 MIL/uL (ref 3.87–5.11)
Retic Count, Absolute: 46.8 10*3/uL (ref 19.0–186.0)
Retic Ct Pct: 1.1 % (ref 0.4–3.1)

## 2018-02-24 LAB — TROPONIN I
Troponin I: 0.1 ng/mL (ref <0.03)
Troponin I: 0.1 ng/mL (ref <0.03)
Troponin I: 0.04 ng/mL (ref <0.03)

## 2018-02-24 LAB — VITAMIN B12: Vitamin B-12: 355 pg/mL (ref 180–914)

## 2018-02-24 LAB — FOLATE: FOLATE: 3.2 ng/mL — AB (ref >5.9)

## 2018-02-24 LAB — MRSA PCR SCREENING: MRSA by PCR: NEGATIVE

## 2018-02-24 MED ORDER — VANCOMYCIN HCL IN DEXTROSE 750-5 MG/150ML-% IV SOLN
750.0000 mg | INTRAVENOUS | Status: DC
  Administered 2018-02-24: 750 mg via INTRAVENOUS
  Filled 2018-02-24: qty 150

## 2018-02-24 MED ORDER — GUAIFENESIN ER 600 MG PO TB12
1200.0000 mg | ORAL_TABLET | Freq: Two times a day (BID) | ORAL | Status: DC
  Administered 2018-02-24 – 2018-03-02 (×13): 1200 mg via ORAL
  Filled 2018-02-24 (×13): qty 2

## 2018-02-24 MED ORDER — SODIUM CHLORIDE 0.9 % IV BOLUS
250.0000 mL | Freq: Once | INTRAVENOUS | Status: AC
  Administered 2018-02-24: 250 mL via INTRAVENOUS

## 2018-02-24 MED ORDER — MOMETASONE FURO-FORMOTEROL FUM 200-5 MCG/ACT IN AERO
INHALATION_SPRAY | RESPIRATORY_TRACT | Status: AC
  Filled 2018-02-24: qty 8.8

## 2018-02-24 NOTE — Progress Notes (Signed)
PROGRESS NOTE    [name]  TGG:[id number] DOB: Dec 30, 1932 DOA: 02/23/2018 PCP: Claretta Fraise, MD    Brief Narrative:  85 year old female with history of dementia, COPD, was brought to the hospital with acute respiratory distress.  Found to have COPD exacerbation as well as pneumonia.  She was started on BiPAP for respiratory distress and received antibiotics as well as IV steroids.  Lactic acid was elevated on admission, but this improved with hydration.  She is continued on IV antibiotics.  Overall respiratory status is slowly improving.   Assessment & Plan:   Principal Problem:   Sepsis due to pneumonia [facility]) Active Problems:   PAD (peripheral artery disease) ([facility])   COPD exacerbation ([facility])   Lung cancer ([place])   Acute respiratory distress   Hypokalemia   Prolonged QT interval   Elevated troponin   1. Sepsis secondary to pneumonia.  Blood cultures are in process.  On IV antibiotics.  Hemodynamics are stabilizing.  Lactic acid improved. 2. COPD with acute exacerbation.  Continue on IV steroids.  Continue bronchodilators.  Overall wheezing is better. 3. Acute respiratory failure with hypoxia.  Initially required BiPAP.  She has since been weaned to nasal cannula.  Continue to monitor. 4. Demand ischemia.  Mild elevation of troponin likely related to demand ischemia.  No complaints of chest pain.  Continue to monitor. 5. Hypokalemia.  Replaced.  Check magnesium 6. Prolonged QT interval.  Likely related to electrolyte imbalances.  Repeat EKG in a.m. 7. Dementia.  Holding Aricept in light of prolonged QT   DVT prophylaxis: Heparin Code Status: Full code Family Communication: Discussed with daughter at the bedside Disposition Plan: Pending hospital course, may need placement   Consultants:     Procedures:     Antimicrobials:   Azactam 2/14 >  Vancomycin 2/14 >   Subjective: Feels that shortness of breath is getting better.  Has been off of BiPAP since earlier  this morning.  She does not have a cough.  Objective: Vitals:   02/24/18 1120 02/24/18 1200 02/24/18 1300 02/24/18 1607  BP:      Pulse: 86 84 90 85  Resp: (!) 24 20 (!) 26 20  Temp: (!) 97.3 F (36.3 C)   (!) 97 F (36.1 C)  TempSrc: Oral   Axillary  SpO2: 94% 100% 97% 97%  Weight:      Height:        Intake/Output Summary (Last 24 hours) at 02/24/2018 1841 Last data filed at 02/24/2018 1600 Gross per 24 hour  Intake 1986.12 ml  Output -  Net 1986.12 ml   Filed Weights   02/24/18 0623  Weight: 54.6 kg    Examination:  General exam: Appears calm and comfortable  Respiratory system: Coarse breath sounds at bases. Respiratory effort normal. Cardiovascular system: S1 & S2 heard, RRR. No JVD, murmurs, rubs, gallops or clicks. No pedal edema. Gastrointestinal system: Abdomen is nondistended, soft and nontender. No organomegaly or masses felt. Normal bowel sounds heard. Central nervous system: No focal neurological deficits. Extremities: Symmetric 5 x 5 power. Skin: No rashes, lesions or ulcers Psychiatry: Confused, pleasant    Data Reviewed: I have personally reviewed following labs and imaging studies  CBC: Recent Labs  Lab 02/23/18 2005 02/24/18 0555  WBC 20.1* 13.3*  NEUTROABS 17.3* 12.7*  HGB 11.3* 8.7*  HCT 38.9 29.4*  MCV 70.9* 71.7*  PLT 394 703   Basic Metabolic Panel: Recent Labs  Lab 02/23/18 2005 02/24/18 0555  NA 136 136  K 2.5* 3.8  CL 97* 106  CO2 24 21*  GLUCOSE 135* 235*  BUN 32* 24*  CREATININE 0.99 0.77  CALCIUM 8.6* 7.7*   GFR: Estimated Creatinine Clearance: 45.9 mL/min (by C-G formula based on SCr of 0.77 mg/dL). Liver Function Tests: Recent Labs  Lab 02/23/18 2005  AST 40  ALT 36  ALKPHOS 79  BILITOT 1.1  PROT 7.4  ALBUMIN 3.5   No results for input(s): LIPASE, AMYLASE in the last 168 hours. No results for input(s): AMMONIA in the last 168 hours. Coagulation Profile: No results for input(s): INR, PROTIME in the last  168 hours. Cardiac Enzymes: Recent Labs  Lab 02/23/18 2005 02/24/18 0006 02/24/18 0555 02/24/18 1627  TROPONINI 0.16* 0.10* 0.10* 0.04*   BNP (last 3 results) No results for input(s): PROBNP in the last 8760 hours. HbA1C: No results for input(s): HGBA1C in the last 72 hours. CBG: No results for input(s): GLUCAP in the last 168 hours. Lipid Profile: No results for input(s): CHOL, HDL, LDLCALC, TRIG, CHOLHDL, LDLDIRECT in the last 72 hours. Thyroid Function Tests: No results for input(s): TSH, T4TOTAL, FREET4, T3FREE, THYROIDAB in the last 72 hours. Anemia Panel: No results for input(s): VITAMINB12, FOLATE, FERRITIN, TIBC, IRON, RETICCTPCT in the last 72 hours. Sepsis Labs: Recent Labs  Lab 02/23/18 2005 02/24/18 0006 02/24/18 0555  LATICACIDVEN 2.7* 3.0* 1.7    Recent Results (from the past 240 hour(s))  Culture, blood (Routine X 2) w Reflex to ID Panel     Status: None (Preliminary result)   Collection Time: 02/23/18  8:03 PM  Result Value Ref Range Status   Specimen Description LEFT ANTECUBITAL  Final   Special Requests   Final    BOTTLES DRAWN AEROBIC AND ANAEROBIC Blood Culture adequate volume   Culture   Final    NO GROWTH < 24 HOURS Performed at [facility], [address], [place], [place] [address]    Report Status PENDING  Incomplete  Culture, blood (Routine X 2) w Reflex to ID Panel     Status: None (Preliminary result)   Collection Time: 02/23/18  8:33 PM  Result Value Ref Range Status   Specimen Description BLOOD LEFT HAND  Final   Special Requests   Final    BOTTLES DRAWN AEROBIC AND ANAEROBIC Blood Culture adequate volume   Culture   Final    NO GROWTH < 12 HOURS Performed at [facility], [address], [place], [place] [address]    Report Status PENDING  Incomplete  MRSA PCR Screening     Status: None   Collection Time: 02/24/18  6:22 AM  Result Value Ref Range Status   MRSA by PCR NEGATIVE NEGATIVE Final    Comment:        The GeneXpert MRSA  Assay (FDA approved for NASAL specimens only), is one component of a comprehensive MRSA colonization surveillance program. It is not intended to diagnose MRSA infection nor to guide or monitor treatment for MRSA infections. Performed at [facility], [address], [place], [place] [address]          Radiology Studies: Dg Chest [facility] 1 View  Result Date: 02/23/2018 CLINICAL DATA:  Shortness of breath. EXAM: PORTABLE CHEST 1 VIEW COMPARISON:  08/24/2016 FINDINGS: The patient is slightly rotated to the left with unchanged cardiomediastinal silhouette. The lungs are hyperinflated. Chronic density is again seen in the left mid upper lung. There is additional superimposed patchy left midlung opacity which is new, and there is also  patchy right basilar opacity. The interstitial markings are increased diffusely compared to the prior study. No definite pleural effusion or pneumothorax is identified. IMPRESSION: Diffusely increased interstitial markings with patchy left midlung and right basilar opacities which may reflect infection or edema. Electronically Signed   By: Logan Bores M.D.   On: 02/23/2018 21:04        Scheduled Meds: . aspirin EC  162 mg Oral Daily  . guaiFENesin  1,200 mg Oral BID  . heparin  5,000 Units Subcutaneous Q8H  . methylPREDNISolone (SOLU-MEDROL) injection  60 mg Intravenous Q6H  . mometasone-formoterol  2 puff Inhalation BID  . oxybutynin  5 mg Oral QID  . potassium chloride  20 mEq Oral Once  . sodium chloride flush  3 mL Intravenous Q12H   Continuous Infusions: . aztreonam 2 g (02/24/18 1511)  . vancomycin       LOS: 1 day    Time spent: 39mins    Kathie Dike, MD [facility]   If 7PM-7AM, please contact night-coverage [email or web address]  02/24/2018, 6:41 PM

## 2018-02-24 NOTE — ED Notes (Addendum)
Date and time results received: 02/24/18 12:55 AM  (use smartphrase ".now" to insert current time)  Test: lactic acid  Critical Value: 3.0  Name of Provider Notified: [email or web address]   Orders Received? Or Actions Taken?:  See mar

## 2018-02-24 NOTE — ED Notes (Addendum)
Date and time results received: 02/23/2018 20:05 time  Test: potassium Critical Value: 2.5  Name of Provider Notified: Lita Mains MD  Orders Received? Or Actions Taken?: provider notified

## 2018-02-24 NOTE — ED Notes (Signed)
Date and time results received: 20:55 02/23/18 (use smartphrase ".now" to insert current time)  Test: troponin Critical Value: notified of critical troponin  Name of Provider Notified: Lita Mains  Orders Received? Or Actions Taken?:

## 2018-02-24 NOTE — Progress Notes (Signed)
Pharmacy Antibiotic Note  [name] is a 85 y.o. female admitted on 02/23/2018 with pneumonia.  Pharmacy has been consulted for Vancomycin dosing.  Plan: Vancomycin 750 mg IV every 24 hours.  Goal trough 15-20 mcg/mL.  Aztreonam 2000 mg IV every 8 hours. Monitor labs, c/s, and vanco levels as indicated.  Height: 5\' 5"  (165.1 cm) Weight: 120 lb 5.9 oz (54.6 kg) IBW/kg (Calculated) : 57  Temp (24hrs), Avg:98 F (36.7 C), Min:97.5 F (36.4 C), Max:98.4 F (36.9 C)  Recent Labs  Lab 02/23/18 2005 02/24/18 0006 02/24/18 0555  WBC 20.1*  --  13.3*  CREATININE 0.99  --  0.77  LATICACIDVEN 2.7* 3.0* 1.7    Estimated Creatinine Clearance: 45.9 mL/min (by C-G formula based on SCr of 0.77 mg/dL).    Allergies  Allergen Reactions  . Codeine Anaphylaxis  . Penicillins Anaphylaxis  . Sulfa Antibiotics Anaphylaxis    Antimicrobials this admission: Aztreonam 2/14 >>  Vanco 2/14 >>   Dose adjustments this admission: N/A  Microbiology results: 2/14 BCx: ngtd  2/14 Sputum: pending  2/14 MRSA PCR: pending  Thank you for allowing pharmacy to be a part of this patient's care.  Ramond Craver 02/24/2018 10:11 AM

## 2018-02-25 ENCOUNTER — Other Ambulatory Visit: Payer: Self-pay

## 2018-02-25 LAB — MAGNESIUM: Magnesium: 2.1 mg/dL (ref 1.7–2.4)

## 2018-02-25 LAB — CBC
HCT: 27.5 % — ABNORMAL LOW (ref 36.0–46.0)
HCT: 27.3 % — ABNORMAL LOW (ref 36.0–46.0)
Hemoglobin: 8 g/dL — ABNORMAL LOW (ref 12.0–15.0)
Hemoglobin: 7.9 g/dL — ABNORMAL LOW (ref 12.0–15.0)
MCH: 21.3 pg — ABNORMAL LOW (ref 26.0–34.0)
MCH: 20.7 pg — ABNORMAL LOW (ref 26.0–34.0)
MCHC: 29.1 g/dL — AB (ref 30.0–36.0)
MCHC: 28.9 g/dL — ABNORMAL LOW (ref 30.0–36.0)
MCV: 73.1 fL — ABNORMAL LOW (ref 80.0–100.0)
MCV: 71.7 fL — ABNORMAL LOW (ref 80.0–100.0)
Platelets: 326 10*3/uL (ref 150–400)
Platelets: 362 10*3/uL (ref 150–400)
RBC: 3.76 MIL/uL — ABNORMAL LOW (ref 3.87–5.11)
RBC: 3.81 MIL/uL — ABNORMAL LOW (ref 3.87–5.11)
RDW: 18.9 % — ABNORMAL HIGH (ref 11.5–15.5)
RDW: 18.9 % — ABNORMAL HIGH (ref 11.5–15.5)
WBC: 21.3 10*3/uL — ABNORMAL HIGH (ref 4.0–10.5)
WBC: 21.9 10*3/uL — ABNORMAL HIGH (ref 4.0–10.5)
nRBC: 0 % (ref 0.0–0.2)
nRBC: 0.1 % (ref 0.0–0.2)

## 2018-02-25 LAB — BASIC METABOLIC PANEL
Anion gap: 7 (ref 5–15)
BUN: 18 mg/dL (ref 8–23)
CO2: 21 mmol/L — ABNORMAL LOW (ref 22–32)
Calcium: 8.4 mg/dL — ABNORMAL LOW (ref 8.9–10.3)
Chloride: 110 mmol/L (ref 98–111)
Creatinine, Ser: 0.67 mg/dL (ref 0.44–1.00)
GFR calc Af Amer: >60 mL/min (ref >60)
GFR calc non Af Amer: >60 mL/min (ref >60)
Glucose, Bld: 177 mg/dL — ABNORMAL HIGH (ref 70–99)
Potassium: 4 mmol/L (ref 3.5–5.1)
Sodium: 138 mmol/L (ref 135–145)

## 2018-02-25 LAB — PROTIME-INR
INR: 1.13
Prothrombin Time: 14.4 seconds (ref 11.4–15.2)

## 2018-02-25 LAB — EXPECTORATED SPUTUM ASSESSMENT W GRAM STAIN, RFLX TO RESP C

## 2018-02-25 LAB — STREP PNEUMONIAE URINARY ANTIGEN: Strep Pneumo Urinary Antigen: NEGATIVE

## 2018-02-25 LAB — GLUCOSE, CAPILLARY: Glucose-Capillary: 158 mg/dL — ABNORMAL HIGH (ref 70–99)

## 2018-02-25 MED ORDER — SODIUM CHLORIDE 0.9 % IV SOLN
INTRAVENOUS | Status: DC | PRN
  Administered 2018-02-25: 250 mL via INTRAVENOUS

## 2018-02-25 MED ORDER — FOLIC ACID 1 MG PO TABS
1.0000 mg | ORAL_TABLET | Freq: Every day | ORAL | Status: DC
  Administered 2018-02-25 – 2018-03-02 (×6): 1 mg via ORAL
  Filled 2018-02-25 (×6): qty 1

## 2018-02-25 MED ORDER — DONEPEZIL HCL 5 MG PO TABS
5.0000 mg | ORAL_TABLET | Freq: Every day | ORAL | Status: DC
  Administered 2018-02-25 – 2018-03-01 (×5): 5 mg via ORAL
  Filled 2018-02-25 (×5): qty 1

## 2018-02-25 MED ORDER — PREDNISONE 20 MG PO TABS
40.0000 mg | ORAL_TABLET | Freq: Every day | ORAL | Status: DC
  Administered 2018-02-26 – 2018-03-02 (×5): 40 mg via ORAL
  Filled 2018-02-25 (×5): qty 2

## 2018-02-25 MED ORDER — PANTOPRAZOLE SODIUM 40 MG PO TBEC
40.0000 mg | DELAYED_RELEASE_TABLET | Freq: Every day | ORAL | Status: DC
  Administered 2018-02-25 – 2018-03-02 (×6): 40 mg via ORAL
  Filled 2018-02-25 (×6): qty 1

## 2018-02-25 MED ORDER — ORAL CARE MOUTH RINSE
15.0000 mL | Freq: Two times a day (BID) | OROMUCOSAL | Status: DC
  Administered 2018-02-26 – 2018-03-02 (×6): 15 mL via OROMUCOSAL

## 2018-02-25 MED ORDER — FUROSEMIDE 20 MG PO TABS
20.0000 mg | ORAL_TABLET | Freq: Every day | ORAL | Status: DC
  Administered 2018-02-25 – 2018-02-27 (×3): 20 mg via ORAL
  Filled 2018-02-25 (×3): qty 1

## 2018-02-25 MED ORDER — SODIUM CHLORIDE 0.9 % IV SOLN
510.0000 mg | Freq: Once | INTRAVENOUS | Status: AC
  Administered 2018-02-25: 510 mg via INTRAVENOUS
  Filled 2018-02-25: qty 17

## 2018-02-25 NOTE — Progress Notes (Signed)
Patient received heparin injection earlier today and injection site is continuously oozing blood. 2 dressings with small gauze pads were placed and both times, each was saturated with blood. Dr. Roderic Palau notified. DVT prophylaxis changed from heparin sq to SCDs. And a pressure dressing consisting of 2 folded gauze pads, a folded ABD pad and tape was applied. Will continue to monitor and labs will be checked.

## 2018-02-25 NOTE — Progress Notes (Signed)
PROGRESS NOTE    [name]  IRW:[id number] DOB: [birth year] DOA: 02/23/2018 PCP: Claretta Fraise, MD    Brief Narrative:  85 year old female with history of dementia, COPD, was brought to the hospital with acute respiratory distress.  Found to have COPD exacerbation as well as pneumonia.  She was started on BiPAP for respiratory distress and received antibiotics as well as IV steroids.  Lactic acid was elevated on admission, but this improved with hydration.  She is continued on IV antibiotics.  Overall respiratory status is slowly improving.   Assessment & Plan:   Principal Problem:   Sepsis due to pneumonia [facility]) Active Problems:   PAD (peripheral artery disease) (HCC)   COPD exacerbation (HCC)   Lung cancer ([place])   Acute respiratory distress   Hypokalemia   Elevated troponin   1. Sepsis secondary to pneumonia.  Blood cultures are in process.  On IV antibiotics.  Hemodynamics are stabilizing.  Lactic acid improved. 2. COPD with acute exacerbation.  Wheezing resolved.  Change Solu-Medrol to prednisone taper.  Continue on bronchodilators.. 3. Acute respiratory failure with hypoxia.  Initially required BiPAP.  She has since been weaned to nasal cannula.  She is currently on 4 L oxygen.  We will continue to wean down as tolerated.  Continue to monitor. 4. Demand ischemia.  Mild elevation of troponin likely related to demand ischemia.  No complaints of chest pain.  Continue to monitor. 5. Hypokalemia.  Replaced.  Magnesium normal 6. Prolonged QT interval.  Related to electrolyte imbalances.  Repeat EKG shows improvement of QT interval. 7. Dementia.  Continue on Aricept 8. Iron deficiency anemia.  No evidence of gross bleeding.  Stool occult blood has been ordered.  Will give a dose of intravenous iron. Continue to monitor.  Folate is also low and will be replaced.  DVT prophylaxis: SCDs Code Status: Full code Family Communication: Discussed with son-in-law at the  bedside Disposition Plan: Pending hospital course, may need placement   Consultants:     Procedures:     Antimicrobials:   Azactam 2/14 >  Vancomycin 2/14 >   Subjective: She has a cough which is nonproductive.  Overall feels her breathing is improving.  Wants to get up and move around.  Objective: Vitals:   02/25/18 0500 02/25/18 0600 02/25/18 0827 02/25/18 1000  BP: (!) 110/55 (!) 162/77    Pulse: 82 77 88   Resp: (!) 27 (!) 23 (!) 30   Temp:   97.7 F (36.5 C)   TempSrc:   Oral   SpO2: 91% 97% 90% 97%  Weight: 54.8 kg     Height:        Intake/Output Summary (Last 24 hours) at 02/25/2018 1102 Last data filed at 02/25/2018 0600 Gross per 24 hour  Intake 1566.77 ml  Output 400 ml  Net 1166.77 ml   Filed Weights   02/24/18 0623 02/25/18 0500  Weight: 54.6 kg 54.8 kg    Examination:  General exam: Alert, awake, oriented x 3 Respiratory system: Crackles at bases. Respiratory effort normal. Cardiovascular system:RRR. No murmurs, rubs, gallops. Gastrointestinal system: Abdomen is nondistended, soft and nontender. No organomegaly or masses felt. Normal bowel sounds heard. Central nervous system: Alert and oriented. No focal neurological deficits. Extremities: No C/C/E, +pedal pulses Skin: No rashes, lesions or ulcers Psychiatry: Judgement and insight appear normal. Mood & affect appropriate.      Data Reviewed: I have personally reviewed following labs and imaging studies  CBC: Recent Labs  Lab  02/23/18 2005 02/24/18 0555 02/25/18 0825  WBC 20.1* 13.3* 21.3*  NEUTROABS 17.3* 12.7*  --   HGB 11.3* 8.7* 8.0*  HCT 38.9 29.4* 27.5*  MCV 70.9* 71.7* 73.1*  PLT 394 317 619   Basic Metabolic Panel: Recent Labs  Lab 02/23/18 2005 02/24/18 0555 02/25/18 0825  NA 136 136 138  K 2.5* 3.8 4.0  CL 97* 106 110  CO2 24 21* 21*  GLUCOSE 135* 235* 177*  BUN 32* 24* 18  CREATININE 0.99 0.77 0.67  CALCIUM 8.6* 7.7* 8.4*  MG  --   --  2.1    GFR: Estimated Creatinine Clearance: 46.1 mL/min (by C-G formula based on SCr of 0.67 mg/dL). Liver Function Tests: Recent Labs  Lab 02/23/18 2005  AST 40  ALT 36  ALKPHOS 79  BILITOT 1.1  PROT 7.4  ALBUMIN 3.5   No results for input(s): LIPASE, AMYLASE in the last 168 hours. No results for input(s): AMMONIA in the last 168 hours. Coagulation Profile: No results for input(s): INR, PROTIME in the last 168 hours. Cardiac Enzymes: Recent Labs  Lab 02/23/18 2005 02/24/18 0006 02/24/18 0555 02/24/18 1627  TROPONINI 0.16* 0.10* 0.10* 0.04*   BNP (last 3 results) No results for input(s): PROBNP in the last 8760 hours. HbA1C: No results for input(s): HGBA1C in the last 72 hours. CBG: Recent Labs  Lab 02/25/18 0828  GLUCAP 158*   Lipid Profile: No results for input(s): CHOL, HDL, LDLCALC, TRIG, CHOLHDL, LDLDIRECT in the last 72 hours. Thyroid Function Tests: No results for input(s): TSH, T4TOTAL, FREET4, T3FREE, THYROIDAB in the last 72 hours. Anemia Panel: Recent Labs    02/24/18 1915  VITAMINB12 355  FOLATE 3.2*  FERRITIN 71  TIBC 302  IRON 15*  RETICCTPCT 1.1   Sepsis Labs: Recent Labs  Lab 02/23/18 2005 02/24/18 0006 02/24/18 0555  LATICACIDVEN 2.7* 3.0* 1.7    Recent Results (from the past 240 hour(s))  Culture, blood (Routine X 2) w Reflex to ID Panel     Status: None (Preliminary result)   Collection Time: 02/23/18  8:03 PM  Result Value Ref Range Status   Specimen Description LEFT ANTECUBITAL  Final   Special Requests   Final    BOTTLES DRAWN AEROBIC AND ANAEROBIC Blood Culture adequate volume   Culture   Final    NO GROWTH 2 DAYS Performed at [facility], [address] [place], [place] [address]    Report Status PENDING  Incomplete  Culture, blood (Routine X 2) w Reflex to ID Panel     Status: None (Preliminary result)   Collection Time: 02/23/18  8:33 PM  Result Value Ref Range Status   Specimen Description BLOOD LEFT HAND  Final    Special Requests   Final    BOTTLES DRAWN AEROBIC AND ANAEROBIC Blood Culture adequate volume   Culture   Final    NO GROWTH 2 DAYS Performed at [facility], [address], [place], [place] [address]    Report Status PENDING  Incomplete  MRSA PCR Screening     Status: None   Collection Time: 02/24/18  6:22 AM  Result Value Ref Range Status   MRSA by PCR NEGATIVE NEGATIVE Final    Comment:        The GeneXpert MRSA Assay (FDA approved for NASAL specimens only), is one component of a comprehensive MRSA colonization surveillance program. It is not intended to diagnose MRSA infection nor to guide or monitor treatment for MRSA infections. Performed at  [place] [place], [place] [address]   Culture, sputum-assessment     Status: None   Collection Time: 02/24/18  9:00 PM  Result Value Ref Range Status   Specimen Description EXPECTORATED SPUTUM  Final   Special Requests NONE  Final   Sputum evaluation   Final    Sputum specimen not acceptable for testing.  Please recollect.   NURSE NOTIFIED AT 0111 ON 02/25/2018 BY EVA Performed at [facility], [address], [place], [place] [address]    Report Status 02/25/2018 FINAL  Final         Radiology Studies: Dg Chest Port 1 View  Result Date: 02/23/2018 CLINICAL DATA:  Shortness of breath. EXAM: PORTABLE CHEST 1 VIEW COMPARISON:  08/24/2016 FINDINGS: The patient is slightly rotated to the left with unchanged cardiomediastinal silhouette. The lungs are hyperinflated. Chronic density is again seen in the left mid upper lung. There is additional superimposed patchy left midlung opacity which is new, and there is also patchy right basilar opacity. The interstitial markings are increased diffusely compared to the prior study. No definite pleural effusion or pneumothorax is identified. IMPRESSION: Diffusely increased interstitial markings with patchy left midlung and right basilar opacities which may reflect infection or  edema. Electronically Signed   By: Logan Bores M.D.   On: 02/23/2018 21:04        Scheduled Meds: . aspirin EC  162 mg Oral Daily  . donepezil  5 mg Oral QHS  . folic acid  1 mg Oral Daily  . furosemide  20 mg Oral Daily  . guaiFENesin  1,200 mg Oral BID  . [START ON 02/26/2018] mouth rinse  15 mL Mouth Rinse BID  . mometasone-formoterol  2 puff Inhalation BID  . oxybutynin  5 mg Oral QID  . pantoprazole  40 mg Oral Daily  . potassium chloride  20 mEq Oral Once  . [START ON 02/26/2018] predniSONE  40 mg Oral Q breakfast  . sodium chloride flush  3 mL Intravenous Q12H   Continuous Infusions: . aztreonam 200 mL/hr at 02/25/18 0600  . ferumoxytol    . vancomycin Stopped (02/24/18 2357)     LOS: 2 days    Time spent: 12mins    Kathie Dike, MD [facility]   If 7PM-7AM, please contact night-coverage [email or web address]  02/25/2018, 11:02 AM

## 2018-02-26 LAB — GLUCOSE, CAPILLARY: Glucose-Capillary: 99 mg/dL (ref 70–99)

## 2018-02-26 LAB — BASIC METABOLIC PANEL
Anion gap: 8 (ref 5–15)
BUN: 18 mg/dL (ref 8–23)
CO2: 23 mmol/L (ref 22–32)
Calcium: 8.5 mg/dL — ABNORMAL LOW (ref 8.9–10.3)
Chloride: 108 mmol/L (ref 98–111)
Creatinine, Ser: 0.75 mg/dL (ref 0.44–1.00)
GFR calc Af Amer: >60 mL/min (ref >60)
GFR calc non Af Amer: >60 mL/min (ref >60)
GLUCOSE: 107 mg/dL — AB (ref 70–99)
Potassium: 3.4 mmol/L — ABNORMAL LOW (ref 3.5–5.1)
Sodium: 139 mmol/L (ref 135–145)

## 2018-02-26 LAB — CBC
HCT: 29.1 % — ABNORMAL LOW (ref 36.0–46.0)
Hemoglobin: 8.3 g/dL — ABNORMAL LOW (ref 12.0–15.0)
MCH: 20.7 pg — ABNORMAL LOW (ref 26.0–34.0)
MCHC: 28.5 g/dL — ABNORMAL LOW (ref 30.0–36.0)
MCV: 72.6 fL — AB (ref 80.0–100.0)
Platelets: 412 10*3/uL — ABNORMAL HIGH (ref 150–400)
RBC: 4.01 MIL/uL (ref 3.87–5.11)
RDW: 19 % — ABNORMAL HIGH (ref 11.5–15.5)
WBC: 24.4 10*3/uL — ABNORMAL HIGH (ref 4.0–10.5)
nRBC: 0 % (ref 0.0–0.2)

## 2018-02-26 LAB — LEGIONELLA PNEUMOPHILA SEROGP 1 UR AG: L. pneumophila Serogp 1 Ur Ag: NEGATIVE

## 2018-02-26 MED ORDER — POTASSIUM CHLORIDE CRYS ER 20 MEQ PO TBCR
40.0000 meq | EXTENDED_RELEASE_TABLET | Freq: Once | ORAL | Status: AC
  Administered 2018-02-26: 40 meq via ORAL
  Filled 2018-02-26: qty 2

## 2018-02-26 MED ORDER — DIPHENHYDRAMINE HCL 12.5 MG/5ML PO ELIX
12.5000 mg | ORAL_SOLUTION | Freq: Once | ORAL | Status: AC
  Administered 2018-02-26: 12.5 mg via ORAL
  Filled 2018-02-26: qty 5

## 2018-02-26 NOTE — Progress Notes (Signed)
PT has not slept at all for the last two nights. PT is becoming more confused, alert to self. PT has spoken of hallucinations and strange people. PT seems delirious from sleep deprivation.

## 2018-02-26 NOTE — Plan of Care (Signed)
  Problem: Acute Rehab PT Goals(only PT should resolve) Goal: Pt Will Go Supine/Side To Sit Outcome: Progressing Flowsheets (Taken 02/26/2018 1359) Pt will go Supine/Side to Sit: with supervision Goal: Patient Will Transfer Sit To/From Stand Outcome: Progressing Flowsheets (Taken 02/26/2018 1359) Patient will transfer sit to/from stand: with minimal assist Note:  With RW Goal: Pt Will Transfer Bed To Chair/Chair To Bed Outcome: Progressing Flowsheets (Taken 02/26/2018 1359) Pt will Transfer Bed to Chair/Chair to Bed: with min assist Note:  With RW Goal: Pt Will Ambulate Outcome: Progressing Flowsheets (Taken 02/26/2018 1359) Pt will Ambulate: 25 feet; with minimal assist; with rolling walker   2:00 PM, 02/26/18 Talbot Grumbling, DPT Physical Therapist with [facility] [phone] office

## 2018-02-26 NOTE — Progress Notes (Signed)
Report called to 300 nurse.  Patient taken to floor via wheelchair.

## 2018-02-26 NOTE — Progress Notes (Signed)
PROGRESS NOTE    [name]  UVO:[id number] DOB: 1932-03-25 DOA: 02/23/2018 PCP: Claretta Fraise, MD    Brief Narrative:  85 year old female with history of dementia, COPD, was brought to the hospital with acute respiratory distress.  Found to have COPD exacerbation as well as pneumonia.  She was started on BiPAP for respiratory distress and received antibiotics as well as IV steroids.  Lactic acid was elevated on admission, but this improved with hydration.  She is continued on IV antibiotics.  Overall respiratory status is slowly improving.   Assessment & Plan:   Principal Problem:   Sepsis due to pneumonia [facility]) Active Problems:   PAD (peripheral artery disease) (HCC)   COPD exacerbation (HCC)   Lung cancer ([place])   Acute respiratory distress   Hypokalemia   Elevated troponin   1. Sepsis secondary to pneumonia.  Blood cultures have not shown any growth.  On IV antibiotics.  Hemodynamics are stabilizing.  Lactic acid improved. 2. COPD with acute exacerbation.  Wheezing resolved.  Currently on prednisone taper.  Continue on bronchodilators. 3. Acute respiratory failure with hypoxia.  Initially required BiPAP.  She has since been weaned to nasal cannula.  She is currently on 4 L oxygen.  We will continue to wean down as tolerated.  Continue to monitor. 4. Leukocytosis.  Related to steroids.  Does not appear septic or toxic. 5. Demand ischemia.  Mild elevation of troponin likely related to demand ischemia.  No complaints of chest pain.  Continue to monitor. 6. Hypokalemia.  Replaced.  Magnesium normal 7. Dementia.  Continue on Aricept.  She is having some agitations overnight.  Will start on Seroquel nightly. 8. Iron deficiency anemia. Stool occult blood has been ordered.  She received a dose of intravenous iron. Continue to monitor.  Folate is also low and is being replaced. 9. Bleeding from injection site.  Patient noted to have bleeding from heparin injection site in her abdomen.   Her hemoglobin has been stable.  Pressure dressing has been applied.  Heparin has been discontinued.  DVT prophylaxis: SCDs Code Status: Full code Family Communication: No family present today Disposition Plan: Skilled nursing facility placement on discharge   Consultants:     Procedures:     Antimicrobials:   Azactam 2/14 >  Vancomycin 2/14 >2/16   Subjective: Feels that her breathing is better.  She is mildly confused today.  Heparin injection site from yesterday was reportedly still oozing and dressings were changed.  Objective: Vitals:   02/26/18 0819 02/26/18 0842 02/26/18 0935 02/26/18 1426  BP:   (!) 144/64 136/70  Pulse:   (!) 57 84  Resp:   19 19  Temp: 97.7 F (36.5 C)  98.7 F (37.1 C)   TempSrc: Axillary  Oral   SpO2:  90% 93% 91%  Weight:   59.9 kg   Height:   5\' 5"  (1.651 m)     Intake/Output Summary (Last 24 hours) at 02/26/2018 1715 Last data filed at 02/26/2018 1536 Gross per 24 hour  Intake 808.37 ml  Output 1300 ml  Net -491.63 ml   Filed Weights   02/25/18 0500 02/26/18 0456 02/26/18 0935  Weight: 54.8 kg 56.1 kg 59.9 kg    Examination:  General exam: Alert, awake, no distress Respiratory system: Coarse breath sounds at bases. Respiratory effort normal. Cardiovascular system:RRR. No murmurs, rubs, gallops. Gastrointestinal system: Abdomen is nondistended, soft and nontender. No organomegaly or masses felt. Normal bowel sounds heard. Central nervous system: No focal  neurological deficits. Extremities: 1+ edema Skin: No rashes, lesions or ulcers Psychiatry: Pleasant, confused at times      Data Reviewed: I have personally reviewed following labs and imaging studies  CBC: Recent Labs  Lab 02/23/18 2005 02/24/18 0555 02/25/18 0825 02/25/18 1626 02/26/18 0612  WBC 20.1* 13.3* 21.3* 21.9* 24.4*  NEUTROABS 17.3* 12.7*  --   --   --   HGB 11.3* 8.7* 8.0* 7.9* 8.3*  HCT 38.9 29.4* 27.5* 27.3* 29.1*  MCV 70.9* 71.7* 73.1* 71.7*  72.6*  PLT 394 317 326 362 488*   Basic Metabolic Panel: Recent Labs  Lab 02/23/18 2005 02/24/18 0555 02/25/18 0825 02/26/18 0612  NA 136 136 138 139  K 2.5* 3.8 4.0 3.4*  CL 97* 106 110 108  CO2 24 21* 21* 23  GLUCOSE 135* 235* 177* 107*  BUN 32* 24* 18 18  CREATININE 0.99 0.77 0.67 0.75  CALCIUM 8.6* 7.7* 8.4* 8.5*  MG  --   --  2.1  --    GFR: Estimated Creatinine Clearance: 47.9 mL/min (by C-G formula based on SCr of 0.75 mg/dL). Liver Function Tests: Recent Labs  Lab 02/23/18 2005  AST 40  ALT 36  ALKPHOS 79  BILITOT 1.1  PROT 7.4  ALBUMIN 3.5   No results for input(s): LIPASE, AMYLASE in the last 168 hours. No results for input(s): AMMONIA in the last 168 hours. Coagulation Profile: Recent Labs  Lab 02/25/18 1626  INR 1.13   Cardiac Enzymes: Recent Labs  Lab 02/23/18 2005 02/24/18 0006 02/24/18 0555 02/24/18 1627  TROPONINI 0.16* 0.10* 0.10* 0.04*   BNP (last 3 results) No results for input(s): PROBNP in the last 8760 hours. HbA1C: No results for input(s): HGBA1C in the last 72 hours. CBG: Recent Labs  Lab 02/25/18 0828 02/26/18 0820  GLUCAP 158* 99   Lipid Profile: No results for input(s): CHOL, HDL, LDLCALC, TRIG, CHOLHDL, LDLDIRECT in the last 72 hours. Thyroid Function Tests: No results for input(s): TSH, T4TOTAL, FREET4, T3FREE, THYROIDAB in the last 72 hours. Anemia Panel: Recent Labs    02/24/18 1915  VITAMINB12 355  FOLATE 3.2*  FERRITIN 71  TIBC 302  IRON 15*  RETICCTPCT 1.1   Sepsis Labs: Recent Labs  Lab 02/23/18 2005 02/24/18 0006 02/24/18 0555  LATICACIDVEN 2.7* 3.0* 1.7    Recent Results (from the past 240 hour(s))  Culture, blood (Routine X 2) w Reflex to ID Panel     Status: None (Preliminary result)   Collection Time: 02/23/18  8:03 PM  Result Value Ref Range Status   Specimen Description LEFT ANTECUBITAL  Final   Special Requests   Final    BOTTLES DRAWN AEROBIC AND ANAEROBIC Blood Culture adequate  volume   Culture   Final    NO GROWTH 3 DAYS Performed at [facility], [address] [place], [place] [address]    Report Status PENDING  Incomplete  Culture, blood (Routine X 2) w Reflex to ID Panel     Status: None (Preliminary result)   Collection Time: 02/23/18  8:33 PM  Result Value Ref Range Status   Specimen Description BLOOD LEFT HAND  Final   Special Requests   Final    BOTTLES DRAWN AEROBIC AND ANAEROBIC Blood Culture adequate volume   Culture   Final    NO GROWTH 3 DAYS Performed at [facility], [address], [place], [place] [address]    Report Status PENDING  Incomplete  MRSA PCR Screening  Status: None   Collection Time: 02/24/18  6:22 AM  Result Value Ref Range Status   MRSA by PCR NEGATIVE NEGATIVE Final    Comment:        The GeneXpert MRSA Assay (FDA approved for NASAL specimens only), is one component of a comprehensive MRSA colonization surveillance program. It is not intended to diagnose MRSA infection nor to guide or monitor treatment for MRSA infections. Performed at [facility], [address] [place], [place] [address]   Culture, sputum-assessment     Status: None   Collection Time: 02/24/18  9:00 PM  Result Value Ref Range Status   Specimen Description EXPECTORATED SPUTUM  Final   Special Requests NONE  Final   Sputum evaluation   Final    Sputum specimen not acceptable for testing.  Please recollect.   NURSE NOTIFIED AT 0111 ON 02/25/2018 BY EVA Performed at [facility], [address] [place], [place] [address]    Report Status 02/25/2018 FINAL  Final         Radiology Studies: No results found.      Scheduled Meds: . donepezil  5 mg Oral QHS  . folic acid  1 mg Oral Daily  . furosemide  20 mg Oral Daily  . guaiFENesin  1,200 mg Oral BID  . mouth rinse  15 mL Mouth Rinse BID  . mometasone-formoterol  2 puff Inhalation BID  . oxybutynin  5 mg Oral QID  . pantoprazole  40 mg Oral Daily  . potassium chloride  20 mEq  Oral Once  . predniSONE  40 mg Oral Q breakfast  . sodium chloride flush  3 mL Intravenous Q12H   Continuous Infusions: . sodium chloride Stopped (02/25/18 2253)  . aztreonam 2 g (02/26/18 1357)     LOS: 3 days    Time spent: 34mins    Kathie Dike, MD [facility]   If 7PM-7AM, please contact night-coverage [email or web address]  02/26/2018, 5:15 PM

## 2018-02-26 NOTE — Evaluation (Signed)
Physical Therapy Evaluation Patient Details Name: [name] MRN: [id number] DOB: Aug 19, 1932 Today's Date: 02/26/2018   History of Present Illness  [name] is a 85 y.o. female with medical history significant for dementia, emphysema, peripheral arterial disease, and lung cancer status post radiation, now presenting to the emergency department for evaluation of acute respiratory distress.  Daughter is at the bedside and assists with the history.  Patient had recently been started on ciprofloxacin for suspected UTI, and then started on azithromycin 2 weeks ago for a respiratory illness.  She seemed to be improving in recent days before acutely worsening today.  Patient was noted to have labored respirations, seemed to be gasping for breath per report of her daughter, and EMS was called.  Patient was given 125 mg of IV Solu-Medrol, multiple neb treatments, and started on CPAP.  She was brought into the ED.  Family reports that the patient has chronic lower extremity edema that seems essentially unchanged.  Patient shakes her head "no" when asked about chest pain.  Daughter is aware that the patient is critically ill and would require intubation if she does not improve, or if she worsens on BiPAP; daughter confirms that patient is full code.    Clinical Impression  Patient presents supine in bed with daughter, granddaughter and personal aide at bedisde and is agreeable to therapy. Patient below baseline for functional mobility and gait, limited secondary to generalized weakness and fatigue with activity and poor standing balance. Pt requires mod assist for transfers and ambulation using RW due to maintaining weight through bil heels and to prevent loss of balance backwards. Pt with generalized weakness and deconditioned requiring supplemental O2, therapeutic rest breaks, and limited in ambulation distance secondary to fatigue.  Pt with limited standing tolerance and requires multiple attempts to rise  from seated surfaces. Pt tolerated remaining up in chair after therapy. Pt on 3.5 LPM throughout therapy and O2 sat 88-93% with functional mobility. With verbal cues for pursed lip breathing, pt's O2 sat rebounds to above 90% with 2-3 breaths. Patient left up in chair, chair alarm on, bil LE elevated, personal aide in room and with call bell in reach. Patient will benefit from continued physical therapy in hospital and recommended venue below to increase strength, balance, endurance for safe ADLs and gait.     Follow Up Recommendations SNF;Supervision/Assistance - 24 hour    Equipment Recommendations  None recommended by PT    Recommendations for Other Services       Precautions / Restrictions Precautions Precautions: Fall Restrictions Weight Bearing Restrictions: No      Mobility  Bed Mobility Overal bed mobility: Needs Assistance Bed Mobility: Supine to Sit     Supine to sit: Min guard     General bed mobility comments: increased time, assistance for scooting to edge of bed  Transfers Overall transfer level: Needs assistance Equipment used: Rolling walker (2 wheeled) Transfers: Sit to/from [facility] Sit to Stand: Mod assist Stand pivot transfers: Mod assist       General transfer comment: unsteadiness upon standing, increased time, verbal cues for hand placement, multiple attempts to rise with assistance to prevent loss of balance backwards  Ambulation/Gait Ambulation/Gait assistance: Mod assist Gait Distance (Feet): 5 Feet Assistive device: Rolling walker (2 wheeled) Gait Pattern/deviations: Decreased step length - right;Decreased step length - left;Decreased stride length;Shuffle Gait velocity: decreased   General Gait Details: labored, 5-6 steps at bedside  Stairs  Wheelchair Mobility    Modified Rankin (Stroke Patients Only)       Balance Overall balance assessment: Needs assistance Sitting-balance support: Feet  supported;Bilateral upper extremity supported Sitting balance-Leahy Scale: Fair Sitting balance - Comments: seated EOB     Standing balance-Leahy Scale: Poor Standing balance comment: with RW, min assist to prevent loss of balance backwards                             Pertinent Vitals/Pain Pain Assessment: No/denies pain    Home Living Family/patient expects to be discharged to:: Private residence Living Arrangements: Other (Comment)(Pt's granddaughter reports the pt has daytime sitters, is alone at night, but has cameras in the home accessible by pt's son and pt's granddaughter to "check on" pt during the night) Available Help at Discharge: Family;Personal care attendant;Available 24 hours/day;Available PRN/intermittently Type of Home: House Home Access: Stairs to enter Entrance Stairs-Rails: Right Entrance Stairs-Number of Steps: 2-3 Home Layout: Two level;Able to live on main level with bedroom/bathroom(pt's granddaughter reports the pt does not go into basement) Home Equipment: Walker - 2 wheels;Walker - 4 wheels;Cane - single point      Prior Function Level of Independence: Needs assistance   Gait / Transfers Assistance Needed: Pt's granddaughter reports the pt ambulates limited distances with [facility] or rollator with aide or family member assistance, recent increase in incidence of falls  ADL's / 31 Assistance Needed: Pt's granddaughter reports the pt requires assistance for all ADLs, has meals provided to her, and family provides transportation        Hand Dominance        Extremity/Trunk Assessment   Upper Extremity Assessment Upper Extremity Assessment: Generalized weakness    Lower Extremity Assessment Lower Extremity Assessment: Generalized weakness    Cervical / Trunk Assessment Cervical / Trunk Assessment: Normal  Communication   Communication: No difficulties  Cognition Arousal/Alertness: Awake/alert Behavior During Therapy: WFL for  tasks assessed/performed Overall Cognitive Status: History of cognitive impairments - at baseline                                 General Comments: Pt alert and oriented to self, date, family members in room, but not oriented to place or situration and asks same question multiple times      General Comments      Exercises     Assessment/Plan    PT Assessment Patient needs continued PT services  PT Problem List Decreased strength;Decreased activity tolerance;Decreased balance;Decreased mobility       PT Treatment Interventions Gait training;Stair training;Functional mobility training;Therapeutic activities;Therapeutic exercise;Balance training;Patient/family education    PT Goals (Current goals can be found in the Care Plan section)  Acute Rehab PT Goals Patient Stated Goal: return home PT Goal Formulation: With patient/family Time For Goal Achievement: 03/12/18 Potential to Achieve Goals: Good    Frequency Min 2X/week   Barriers to discharge        Co-evaluation               AM-PAC PT "6 Clicks" Mobility  Outcome Measure Help needed turning from your back to your side while in a flat bed without using bedrails?: A Little Help needed moving from lying on your back to sitting on the side of a flat bed without using bedrails?: A Little Help needed moving to and from a bed to a chair (including a wheelchair)?:  A Lot Help needed standing up from a chair using your arms (e.g., wheelchair or bedside chair)?: A Lot Help needed to walk in hospital room?: A Lot Help needed climbing 3-5 steps with a railing? : A Lot 6 Click Score: 14    End of Session Equipment Utilized During Treatment: Oxygen;Gait belt(3.5 LPM) Activity Tolerance: Patient tolerated treatment well;Patient limited by fatigue Patient left: in chair;with call bell/phone within reach;with chair alarm set;with family/visitor present Nurse Communication: Mobility status PT Visit Diagnosis:  Unsteadiness on feet (R26.81);Other abnormalities of gait and mobility (R26.89);Muscle weakness (generalized) (M62.81)    Time: 7322-5672 PT Time Calculation (min) (ACUTE ONLY): 28 min   Charges:     PT Treatments $Gait Training: 8-22 mins $Therapeutic Activity: 8-22 mins       1:59 PM, 02/26/18 Talbot Grumbling, DPT Physical Therapist with [facility] [phone] office

## 2018-02-26 NOTE — Care Management (Signed)
CM consulted for home health.  Discussed during progressions rounds, anticipate patient will need SNF. PT eval pending.

## 2018-02-27 ENCOUNTER — Inpatient Hospital Stay (HOSPITAL_COMMUNITY): Payer: Medicare Other

## 2018-02-27 LAB — CBC
HEMATOCRIT: 29.1 % — AB (ref 36.0–46.0)
HEMOGLOBIN: 8.6 g/dL — AB (ref 12.0–15.0)
MCH: 21.3 pg — ABNORMAL LOW (ref 26.0–34.0)
MCHC: 29.6 g/dL — ABNORMAL LOW (ref 30.0–36.0)
MCV: 72.2 fL — ABNORMAL LOW (ref 80.0–100.0)
Platelets: 441 10*3/uL — ABNORMAL HIGH (ref 150–400)
RBC: 4.03 MIL/uL (ref 3.87–5.11)
RDW: 18.9 % — AB (ref 11.5–15.5)
WBC: 17.7 10*3/uL — AB (ref 4.0–10.5)
nRBC: 0 % (ref 0.0–0.2)

## 2018-02-27 LAB — BASIC METABOLIC PANEL
Anion gap: 8 (ref 5–15)
BUN: 16 mg/dL (ref 8–23)
CHLORIDE: 105 mmol/L (ref 98–111)
CO2: 27 mmol/L (ref 22–32)
CREATININE: 0.61 mg/dL (ref 0.44–1.00)
Calcium: 8.3 mg/dL — ABNORMAL LOW (ref 8.9–10.3)
GFR calc Af Amer: >60 mL/min (ref >60)
GFR calc non Af Amer: >60 mL/min (ref >60)
Glucose, Bld: 92 mg/dL (ref 70–99)
Potassium: 3.8 mmol/L (ref 3.5–5.1)
SODIUM: 140 mmol/L (ref 135–145)

## 2018-02-27 LAB — GLUCOSE, CAPILLARY: Glucose-Capillary: 91 mg/dL (ref 70–99)

## 2018-02-27 NOTE — Clinical Social Work Placement (Signed)
   CLINICAL SOCIAL WORK PLACEMENT  NOTE  Date:  02/27/2018  Patient Details  Name: [name] MRN: [id number] Date of Birth: [birth year]  Clinical Social Work is seeking post-discharge placement for this patient at the [place] level of care (*CSW will initial, date and re-position this form in  chart as items are completed):  Yes   Patient/family provided with [facility] Work Department's list of facilities offering this level of care within the geographic area requested by the patient (or if unable, by the patient's family).  Yes   Patient/family informed of their freedom to choose among providers that offer the needed level of care, that participate in Medicare, Medicaid or managed care program needed by the patient, have an available bed and are willing to accept the patient.  Yes   Patient/family informed of [facility]'s ownership interest in [facility] and [facility], as well as of the fact that they are under no obligation to receive care at these facilities.  PASRR submitted to EDS on       PASRR number received on       Existing PASRR number confirmed on 02/27/18     FL2 transmitted to all facilities in geographic area requested by pt/family on 02/27/18     FL2 transmitted to all facilities within larger geographic area on       Patient informed that his/her managed care company has contracts with or will negotiate with certain facilities, including the following:            Patient/family informed of bed offers received.  Patient chooses bed at       Physician recommends and patient chooses bed at      Patient to be transferred to   on  .  Patient to be transferred to facility by       Patient family notified on   of transfer.  Name of family member notified:        PHYSICIAN       Additional Comment:    _______________________________________________ Ihor Gully, LCSW 02/27/2018, 2:11 PM

## 2018-02-27 NOTE — NC FL2 (Signed)
[place] LEVEL OF CARE SCREENING TOOL     IDENTIFICATION  Patient Name: [name] Birthdate: 1932/01/31 Sex: female Admission Date (Current Location): 02/23/2018  [facility] and [place] Number:  [employer] and Address:  [place] [address], [place]      Provider Number: [id number]  Attending Physician Name and Address:  Kathie Dike, MD  Relative Name and Phone Number:       Current Level of Care: Hospital Recommended Level of Care: [place] Prior Approval Number:    Date Approved/Denied:   PASRR Number: [id number]  Discharge Plan: SNF    Current Diagnoses: Patient Active Problem List   Diagnosis Date Noted  . Acute respiratory distress 02/23/2018  . Sepsis due to pneumonia ([place]) 02/23/2018  . Hypokalemia 02/23/2018  . Elevated troponin 02/23/2018  . Current smoker 09/05/2017  . Stress incontinence of urine 05/23/2016  . Venous stasis of both lower extremities 03/03/2016  . Lung cancer ([place]) 11/14/2014  . Hyperlipidemia with target LDL less than 100 11/14/2014  . Centrilobular emphysema ([place]) 10/08/2013  . Protein-calorie malnutrition, severe ([place]) 06/21/2013  . COPD exacerbation ([place]) 06/20/2013  . PAD (peripheral artery disease) ([place]) 05/17/2012  . Pain in limb 05/17/2012  . Edema 02/16/2012  . IBS (irritable bowel syndrome) 02/09/2012  . Lymphocytic colitis 12/15/2011  . Personal history of adenomatous colonic polyps 12/02/2011  . GERD 10/20/2008    Orientation RESPIRATION BLADDER Height & Weight     Self  O2(2L) Incontinent Weight: 121 lb 14.6 oz (55.3 kg) Height:  5\' 5"  (165.1 cm)  BEHAVIORAL SYMPTOMS/MOOD NEUROLOGICAL BOWEL NUTRITION STATUS      Incontinent Diet(Soft)  AMBULATORY STATUS COMMUNICATION OF NEEDS Skin   Limited Assist Verbally Normal                       Personal Care Assistance Level of Assistance  Bathing, Feeding, Dressing Bathing Assistance:  Limited assistance Feeding assistance: Independent Dressing Assistance: Limited assistance     Functional Limitations Info  Sight, Hearing, Speech Sight Info: Adequate Hearing Info: Adequate Speech Info: Adequate    SPECIAL CARE FACTORS FREQUENCY  PT (By licensed PT)     PT Frequency: 5x/week              Contractures Contractures Info: Not present    Additional Factors Info  Code Status, Allergies, Psychotropic Code Status Info: Full Code Allergies Info: Codeine, Penicillins, Sulfa Antibiotics Psychotropic Info: Xanax         Current Medications (02/27/2018):  This is the current hospital active medication list Current Facility-Administered Medications  Medication Dose Route Frequency Provider Last Rate Last Dose  . 0.9 %  sodium chloride infusion   Intravenous PRN Kathie Dike, MD   Stopped at 02/25/18 2253  . acetaminophen (TYLENOL) tablet 650 mg  650 mg Oral Q6H PRN Opyd, Ilene Qua, MD       Or  . acetaminophen (TYLENOL) suppository 650 mg  650 mg Rectal Q6H PRN Opyd, Ilene Qua, MD      . ALPRAZolam Duanne Moron) tablet 0.5 mg  0.5 mg Oral BID PRN Opyd, Ilene Qua, MD   0.5 mg at 02/26/18 2017  . aztreonam (AZACTAM) 2 g in sodium chloride 0.9 % 100 mL IVPB  2 g Intravenous Q8H Opyd, Ilene Qua, MD 200 mL/hr at 02/27/18 1328 2 g at 02/27/18 1328  . donepezil (ARICEPT) tablet 5 mg  5 mg Oral QHS Memon, Jehanzeb,  MD   5 mg at 32/35/57 3220  . folic acid (FOLVITE) tablet 1 mg  1 mg Oral Daily Kathie Dike, MD   1 mg at 02/27/18 2542  . furosemide (LASIX) tablet 20 mg  20 mg Oral Daily Kathie Dike, MD   20 mg at 02/27/18 0927  . guaiFENesin (MUCINEX) 12 hr tablet 1,200 mg  1,200 mg Oral BID Kathie Dike, MD   1,200 mg at 02/27/18 0927  . levalbuterol (XOPENEX) nebulizer solution 0.63 mg  0.63 mg Nebulization Q6H PRN Opyd, Ilene Qua, MD      . MEDLINE mouth rinse  15 mL Mouth Rinse BID Kathie Dike, MD   15 mL at 02/26/18 2044  . mometasone-formoterol (DULERA)  200-5 MCG/ACT inhaler 2 puff  2 puff Inhalation BID Opyd, Ilene Qua, MD   2 puff at 02/27/18 0955  . oxybutynin (DITROPAN) tablet 5 mg  5 mg Oral QID Opyd, Ilene Qua, MD   5 mg at 02/27/18 1328  . pantoprazole (PROTONIX) EC tablet 40 mg  40 mg Oral Daily Kathie Dike, MD   40 mg at 02/27/18 0927  . predniSONE (DELTASONE) tablet 40 mg  40 mg Oral Q breakfast Kathie Dike, MD   40 mg at 02/27/18 0926  . sodium chloride flush (NS) 0.9 % injection 3 mL  3 mL Intravenous Q12H Opyd, Ilene Qua, MD   3 mL at 02/26/18 2018     Discharge Medications: Please see discharge summary for a list of discharge medications.  Relevant Imaging Results:  Relevant Lab Results:   Additional Information SSN [id number], Clydene Pugh, LCSW

## 2018-02-27 NOTE — Clinical Social Work Note (Signed)
`  Clinical Social Work Assessment  Patient Details  Name: [name] MRN: [id number] Date of Birth: October 14, 1932  Date of referral:  02/27/18               Reason for consult:  Facility Placement                Permission sought to share information with:    Permission granted to share information::     Name::        Agency::     Relationship::     Contact Information:  Hinton Dyer, granddaughter  Housing/Transportation Living arrangements for the past 2 months:  [place] of Information:  Other (Comment Required)(Granddaughter, Hinton Dyer) Patient Interpreter Needed:  None Criminal Activity/Legal Involvement Pertinent to Current Situation/Hospitalization:  No - Comment as needed Significant Relationships:  Adult Children, Other Family Members Lives with:  Self Do you feel safe going back to the place where you live?  Yes Need for family participation in patient care:  Yes (Comment)  Care giving concerns:  Patient has a day time care giver that is private pay. Family has camera's in the home at night so that they can see patient when she get up to use the bathroom or go in to the kitchen for safety purposes.    Social Worker assessment / plan:  Hinton Dyer, granddaughter, indicated that they have been spending patient's funds down in preparation for her to transition to long term care. She stated that she feels that patient is approaching a point where she needs 24/7 care.  Patient family is agreeable to short term rehab at [facility] and will continue to working on obtaining Medicaid for long term care placement.     Employment status:  Retired Forensic scientist:  Medicare PT Recommendations:  [place] / Referral to community resources:  [place]  Patient/Family's Response to care:  Family is agreeable for placement.   Patient/Family's Understanding of and Emotional Response to Diagnosis, Current Treatment, and Prognosis:  Patient and  family understand patient's diagnosis, treatment and prognosis and feel SNF is the most appropriate plan.   Emotional Assessment Appearance:  Appears stated age Attitude/Demeanor/Rapport:    Affect (typically observed):  Unable to Assess Orientation:  Oriented to Self Alcohol / Substance use:  Not Applicable Psych involvement (Current and /or in the community):  No (Comment)  Discharge Needs  Concerns to be addressed:  Discharge Planning Concerns Readmission within the last 30 days:  No Current discharge risk:  None Barriers to Discharge:  No Barriers Identified   Ihor Gully, LCSW 02/27/2018, 2:14 PM

## 2018-02-27 NOTE — Clinical Social Work Placement (Signed)
   CLINICAL SOCIAL WORK PLACEMENT  NOTE  Date:  02/27/2018  Patient Details  Name: [name] MRN: [id number] Date of Birth: 10-25-1932  Clinical Social Work is seeking post-discharge placement for this patient at the [place] level of care (*CSW will initial, date and re-position this form in  chart as items are completed):  Yes   Patient/family provided with [place] Work Department's list of facilities offering this level of care within the geographic area requested by the patient (or if unable, by the patient's family).  Yes   Patient/family informed of their freedom to choose among providers that offer the needed level of care, that participate in Medicare, Medicaid or managed care program needed by the patient, have an available bed and are willing to accept the patient.  Yes   Patient/family informed of [facility]'s ownership interest in [facility] and [facility], as well as of the fact that they are under no obligation to receive care at these facilities.  PASRR submitted to EDS on       PASRR number received on       Existing PASRR number confirmed on 02/27/18     FL2 transmitted to all facilities in geographic area requested by pt/family on 02/27/18     FL2 transmitted to all facilities within larger geographic area on       Patient informed that his/her managed care company has contracts with or will negotiate with certain facilities, including the following:        Yes   Patient/family informed of bed offers received.  Patient [facility] bed at [facility]     Physician recommends and patient chooses bed at      Patient to be transferred to [facility] on  .  Patient to be transferred to facility by       Patient family notified on   of transfer.  Name of family member notified:        PHYSICIAN       Additional Comment:    _______________________________________________ Ihor Gully,  LCSW 02/27/2018, 4:11 PM

## 2018-02-27 NOTE — Progress Notes (Signed)
PROGRESS NOTE    [name]  BSW:[id number] DOB: 1932-10-16 DOA: 02/23/2018 PCP: Claretta Fraise, MD    Brief Narrative:  85 year old female with history of dementia, COPD, was brought to the hospital with acute respiratory distress.  Found to have COPD exacerbation as well as pneumonia.  She was started on BiPAP for respiratory distress and received antibiotics as well as IV steroids.  Lactic acid was elevated on admission, but this improved with hydration.  She is continued on IV antibiotics.  Overall respiratory status is slowly improving.   Assessment & Plan:   Principal Problem:   Sepsis due to pneumonia [facility]) Active Problems:   PAD (peripheral artery disease) (HCC)   COPD exacerbation (HCC)   Lung cancer ([place])   Acute respiratory distress   Hypokalemia   Elevated troponin   1. Sepsis secondary to pneumonia.  Blood cultures have not shown any growth.  On IV antibiotics.  Hemodynamics are stabilizing.  Lactic acid improved. 2. COPD with acute exacerbation.  Wheezing resolved.  Currently on prednisone taper.  Continue on bronchodilators. 3. Acute respiratory failure with hypoxia.  Initially required BiPAP.  She has since been weaned to nasal cannula.  She is currently on 4 L oxygen, but becomes hypoxic when attempts are made to wean further.  Will repeat chest x-ray.  We will continue to wean down as tolerated.  Continue to monitor. 4. Leukocytosis.  Related to steroids.  Does not appear septic or toxic.  Trending down 5. Demand ischemia.  Mild elevation of troponin likely related to demand ischemia.  No complaints of chest pain.  Continue to monitor. 6. Hypokalemia.  Replaced.  Magnesium normal 7. Dementia.  Continue on Aricept.  She is having some agitations overnight.   8. Iron deficiency anemia. Stool occult blood has been ordered.  She received a dose of intravenous iron. Continue to monitor.  Folate is also low and is being replaced.  Hemoglobin has been stable 9. Bleeding  from injection site.  Patient noted to have bleeding from heparin injection site in her abdomen.  Her hemoglobin has been stable.  Pressure dressing has been applied.  Heparin has been discontinued.  This is now resolved  DVT prophylaxis: SCDs Code Status: Full code Family Communication: No family present today Disposition Plan: Skilled nursing facility placement on discharge   Consultants:     Procedures:     Antimicrobials:   Azactam 2/14 >  Vancomycin 2/14 >2/16   Subjective: Feels that her breathing is slowly improving.  Still confused at times.  Objective: Vitals:   02/27/18 0500 02/27/18 0550 02/27/18 1000 02/27/18 1344  BP:  115/61  (!) 104/47  Pulse:  86  (!) 105  Resp:  18  20  Temp:  98.1 F (36.7 C)  98.7 F (37.1 C)  TempSrc:  Oral    SpO2:  95% 96% (!) 84%  Weight: 55.3 kg     Height:        Intake/Output Summary (Last 24 hours) at 02/27/2018 1723 Last data filed at 02/27/2018 1328 Gross per 24 hour  Intake -  Output 1000 ml  Net -1000 ml   Filed Weights   02/26/18 0456 02/26/18 0935 02/27/18 0500  Weight: 56.1 kg 59.9 kg 55.3 kg    Examination:  General exam: Alert, awake, no distress Respiratory system: Crackles at bases. Respiratory effort normal. Cardiovascular system:RRR. No murmurs, rubs, gallops. Gastrointestinal system: Abdomen is nondistended, soft and nontender. No organomegaly or masses felt. Normal bowel sounds heard.  Central nervous system:  No focal neurological deficits. Extremities: No C/C/E, +pedal pulses Skin: No rashes, lesions or ulcers Psychiatry: Confused at times, pleasant   Data Reviewed: I have personally reviewed following labs and imaging studies  CBC: Recent Labs  Lab 02/23/18 2005 02/24/18 0555 02/25/18 0825 02/25/18 1626 02/26/18 0612 02/27/18 0530  WBC 20.1* 13.3* 21.3* 21.9* 24.4* 17.7*  NEUTROABS 17.3* 12.7*  --   --   --   --   HGB 11.3* 8.7* 8.0* 7.9* 8.3* 8.6*  HCT 38.9 29.4* 27.5* 27.3*  29.1* 29.1*  MCV 70.9* 71.7* 73.1* 71.7* 72.6* 72.2*  PLT 394 317 326 362 412* 213*   Basic Metabolic Panel: Recent Labs  Lab 02/23/18 2005 02/24/18 0555 02/25/18 0825 02/26/18 0612 02/27/18 0530  NA 136 136 138 139 140  K 2.5* 3.8 4.0 3.4* 3.8  CL 97* 106 110 108 105  CO2 24 21* 21* 23 27  GLUCOSE 135* 235* 177* 107* 92  BUN 32* 24* 18 18 16   CREATININE 0.99 0.77 0.67 0.75 0.61  CALCIUM 8.6* 7.7* 8.4* 8.5* 8.3*  MG  --   --  2.1  --   --    GFR: Estimated Creatinine Clearance: 46.5 mL/min (by C-G formula based on SCr of 0.61 mg/dL). Liver Function Tests: Recent Labs  Lab 02/23/18 2005  AST 40  ALT 36  ALKPHOS 79  BILITOT 1.1  PROT 7.4  ALBUMIN 3.5   No results for input(s): LIPASE, AMYLASE in the last 168 hours. No results for input(s): AMMONIA in the last 168 hours. Coagulation Profile: Recent Labs  Lab 02/25/18 1626  INR 1.13   Cardiac Enzymes: Recent Labs  Lab 02/23/18 2005 02/24/18 0006 02/24/18 0555 02/24/18 1627  TROPONINI 0.16* 0.10* 0.10* 0.04*   BNP (last 3 results) No results for input(s): PROBNP in the last 8760 hours. HbA1C: No results for input(s): HGBA1C in the last 72 hours. CBG: Recent Labs  Lab 02/25/18 0828 02/26/18 0820 02/27/18 0748  GLUCAP 158* 99 91   Lipid Profile: No results for input(s): CHOL, HDL, LDLCALC, TRIG, CHOLHDL, LDLDIRECT in the last 72 hours. Thyroid Function Tests: No results for input(s): TSH, T4TOTAL, FREET4, T3FREE, THYROIDAB in the last 72 hours. Anemia Panel: Recent Labs    02/24/18 1915  VITAMINB12 355  FOLATE 3.2*  FERRITIN 71  TIBC 302  IRON 15*  RETICCTPCT 1.1   Sepsis Labs: Recent Labs  Lab 02/23/18 2005 02/24/18 0006 02/24/18 0555  LATICACIDVEN 2.7* 3.0* 1.7    Recent Results (from the past 240 hour(s))  Culture, blood (Routine X 2) w Reflex to ID Panel     Status: None (Preliminary result)   Collection Time: 02/23/18  8:03 PM  Result Value Ref Range Status   Specimen  Description LEFT ANTECUBITAL  Final   Special Requests   Final    BOTTLES DRAWN AEROBIC AND ANAEROBIC Blood Culture adequate volume   Culture   Final    NO GROWTH 4 DAYS Performed at [facility], [address], [place], [place] [address]    Report Status PENDING  Incomplete  Culture, blood (Routine X 2) w Reflex to ID Panel     Status: None (Preliminary result)   Collection Time: 02/23/18  8:33 PM  Result Value Ref Range Status   Specimen Description BLOOD LEFT HAND  Final   Special Requests   Final    BOTTLES DRAWN AEROBIC AND ANAEROBIC Blood Culture adequate volume   Culture   Final  NO GROWTH 4 DAYS Performed at [facility], [address] [place], [place] [address]    Report Status PENDING  Incomplete  MRSA PCR Screening     Status: None   Collection Time: 02/24/18  6:22 AM  Result Value Ref Range Status   MRSA by PCR NEGATIVE NEGATIVE Final    Comment:        The GeneXpert MRSA Assay (FDA approved for NASAL specimens only), is one component of a comprehensive MRSA colonization surveillance program. It is not intended to diagnose MRSA infection nor to guide or monitor treatment for MRSA infections. Performed at [facility], [address] [place], [place] [address]   Culture, sputum-assessment     Status: None   Collection Time: 02/24/18  9:00 PM  Result Value Ref Range Status   Specimen Description EXPECTORATED SPUTUM  Final   Special Requests NONE  Final   Sputum evaluation   Final    Sputum specimen not acceptable for testing.  Please recollect.   NURSE NOTIFIED AT 0111 ON 02/25/2018 BY EVA Performed at [facility], [address] [place],  [address]    Report Status 02/25/2018 FINAL  Final         Radiology Studies: No results found.      Scheduled Meds: . donepezil  5 mg Oral QHS  . folic acid  1 mg Oral Daily  . furosemide  20 mg Oral Daily  . guaiFENesin  1,200 mg Oral BID  . mouth rinse  15 mL Mouth Rinse BID  .  mometasone-formoterol  2 puff Inhalation BID  . oxybutynin  5 mg Oral QID  . pantoprazole  40 mg Oral Daily  . predniSONE  40 mg Oral Q breakfast  . sodium chloride flush  3 mL Intravenous Q12H   Continuous Infusions: . sodium chloride Stopped (02/25/18 2253)  . aztreonam 2 g (02/27/18 1328)     LOS: 4 days    Time spent: 85mins    Kathie Dike, MD [facility]   If 7PM-7AM, please contact night-coverage [email or web address]  02/27/2018, 5:23 PM

## 2018-02-28 LAB — BASIC METABOLIC PANEL
Anion gap: 9 (ref 5–15)
BUN: 13 mg/dL (ref 8–23)
CO2: 27 mmol/L (ref 22–32)
Calcium: 8.1 mg/dL — ABNORMAL LOW (ref 8.9–10.3)
Chloride: 102 mmol/L (ref 98–111)
Creatinine, Ser: 0.56 mg/dL (ref 0.44–1.00)
GFR calc Af Amer: >60 mL/min (ref >60)
GFR calc non Af Amer: >60 mL/min (ref >60)
Glucose, Bld: 88 mg/dL (ref 70–99)
Potassium: 3.3 mmol/L — ABNORMAL LOW (ref 3.5–5.1)
Sodium: 138 mmol/L (ref 135–145)

## 2018-02-28 LAB — CBC
HEMATOCRIT: 29.3 % — AB (ref 36.0–46.0)
Hemoglobin: 8.5 g/dL — ABNORMAL LOW (ref 12.0–15.0)
MCH: 20.4 pg — ABNORMAL LOW (ref 26.0–34.0)
MCHC: 29 g/dL — AB (ref 30.0–36.0)
MCV: 70.4 fL — ABNORMAL LOW (ref 80.0–100.0)
Platelets: 453 10*3/uL — ABNORMAL HIGH (ref 150–400)
RBC: 4.16 MIL/uL (ref 3.87–5.11)
RDW: 19 % — ABNORMAL HIGH (ref 11.5–15.5)
WBC: 17.9 10*3/uL — ABNORMAL HIGH (ref 4.0–10.5)
nRBC: 0.2 % (ref 0.0–0.2)

## 2018-02-28 LAB — CULTURE, BLOOD (ROUTINE X 2)
Culture: NO GROWTH
Culture: NO GROWTH
Special Requests: ADEQUATE
Special Requests: ADEQUATE

## 2018-02-28 LAB — GLUCOSE, CAPILLARY: Glucose-Capillary: 89 mg/dL (ref 70–99)

## 2018-02-28 MED ORDER — POTASSIUM CHLORIDE CRYS ER 20 MEQ PO TBCR
40.0000 meq | EXTENDED_RELEASE_TABLET | Freq: Once | ORAL | Status: AC
  Administered 2018-02-28: 40 meq via ORAL
  Filled 2018-02-28: qty 2

## 2018-02-28 MED ORDER — AZITHROMYCIN 250 MG PO TABS
500.0000 mg | ORAL_TABLET | Freq: Every day | ORAL | Status: DC
  Administered 2018-02-28 – 2018-03-02 (×3): 500 mg via ORAL
  Filled 2018-02-28 (×3): qty 2

## 2018-02-28 MED ORDER — FUROSEMIDE 10 MG/ML IJ SOLN
20.0000 mg | Freq: Every day | INTRAMUSCULAR | Status: DC
  Administered 2018-02-28 – 2018-03-02 (×3): 20 mg via INTRAVENOUS
  Filled 2018-02-28 (×3): qty 2

## 2018-02-28 MED ORDER — POTASSIUM CHLORIDE CRYS ER 20 MEQ PO TBCR
20.0000 meq | EXTENDED_RELEASE_TABLET | Freq: Every day | ORAL | Status: DC
  Administered 2018-03-01 – 2018-03-02 (×2): 20 meq via ORAL
  Filled 2018-02-28 (×3): qty 1

## 2018-02-28 MED ORDER — DIPHENHYDRAMINE HCL 25 MG PO CAPS
25.0000 mg | ORAL_CAPSULE | Freq: Four times a day (QID) | ORAL | Status: DC | PRN
  Administered 2018-02-28: 25 mg via ORAL
  Filled 2018-02-28: qty 1

## 2018-02-28 NOTE — Progress Notes (Signed)
PROGRESS NOTE    [name]  RSW:[id number] DOB: 1932/12/26 DOA: 02/23/2018 PCP: Claretta Fraise, MD    Brief Narrative:  85 year old female with history of dementia, COPD, was brought to the hospital with acute respiratory distress.  Found to have COPD exacerbation as well as pneumonia.  She was started on BiPAP for respiratory distress and received antibiotics as well as IV steroids.  Lactic acid was elevated on admission, but this improved with hydration.  She is continued on IV antibiotics.  Overall respiratory status is slowly improving.   Assessment & Plan:   Principal Problem:   Sepsis due to pneumonia [facility]) Active Problems:   PAD (peripheral artery disease) (HCC)   COPD exacerbation (HCC)   Lung cancer ([place])   Acute respiratory distress   Hypokalemia   Elevated troponin   1. Sepsis secondary to pneumonia.  Blood cultures have not shown any growth.  On IV antibiotics.  Hemodynamics are stabilizing.  Lactic acid improved.  Since chest x-ray indicates pulmonary edema versus atypical infection, will add azithromycin. 2. COPD with acute exacerbation.  Wheezing resolved.  Currently on prednisone taper.  Continue on bronchodilators. 3. Acute respiratory failure with hypoxia.  Initially required BiPAP.  She has since been weaned to nasal cannula.  She is currently on 4 L oxygen, but becomes hypoxic when attempts are made to wean further.  Repeat chest x-ray shows persistent infiltrates concerning for pulmonary edema versus atypical infection.   4. Congestive heart failure.  Patient has evidence of volume overload and pulmonary edema.  Continue on IV diuresis.  Check echocardiogram. 5. Leukocytosis.  Related to steroids.  Does not appear septic or toxic.  Trending down 6. Demand ischemia.  Mild elevation of troponin likely related to demand ischemia.  No complaints of chest pain.  Continue to monitor. 7. Hypokalemia.  Replaced.  Magnesium normal 8. Dementia.  Continue on Aricept.      9. Iron deficiency anemia. Stool occult blood has been ordered.  She does not have any evidence of gross bleeding.  She received a dose of intravenous iron. Continue to monitor.  Folate is also low and is being replaced.  Hemoglobin has been stable  DVT prophylaxis: SCDs Code Status: Full code Family Communication: Discussed with daughter at the bedside Disposition Plan: Skilled nursing facility placement on discharge   Consultants:     Procedures:     Antimicrobials:   Azactam 2/14 >  Vancomycin 2/14 >2/16  Azithromycin 2/19 >   Subjective: No nausea or vomiting, no chest pain.  Denies any shortness of breath.  Frequently becomes hypoxic when oxygen is titrated down  Objective: Vitals:   02/28/18 0530 02/28/18 0843 02/28/18 1137 02/28/18 1345  BP: (!) 132/48   (!) 111/47  Pulse: 72   85  Resp: 18   19  Temp: 98.3 F (36.8 C)   98.6 F (37 C)  TempSrc: Oral   Oral  SpO2: 95% 96% 97% (!) 88%  Weight:      Height:        Intake/Output Summary (Last 24 hours) at 02/28/2018 1832 Last data filed at 02/28/2018 1300 Gross per 24 hour  Intake 480 ml  Output 700 ml  Net -220 ml   Filed Weights   02/26/18 0935 02/27/18 0500 02/28/18 0500  Weight: 59.9 kg 55.3 kg 54.6 kg    Examination:  General exam: Alert, awake, oriented x 3 Respiratory system: Diminished breath sounds at bases with crackles. Respiratory effort normal. Cardiovascular system:RRR. No murmurs,  rubs, gallops. Gastrointestinal system: Abdomen is nondistended, soft and nontender. No organomegaly or masses felt. Normal bowel sounds heard. Central nervous system: Alert and oriented. No focal neurological deficits. Extremities: 1+ edema bilaterally Skin: No rashes, lesions or ulcers Psychiatry: Judgement and insight appear normal. Mood & affect appropriate.     Data Reviewed: I have personally reviewed following labs and imaging studies  CBC: Recent Labs  Lab 02/23/18 2005 02/24/18 0555  02/25/18 0825 02/25/18 1626 02/26/18 0612 02/27/18 0530 02/28/18 0519  WBC 20.1* 13.3* 21.3* 21.9* 24.4* 17.7* 17.9*  NEUTROABS 17.3* 12.7*  --   --   --   --   --   HGB 11.3* 8.7* 8.0* 7.9* 8.3* 8.6* 8.5*  HCT 38.9 29.4* 27.5* 27.3* 29.1* 29.1* 29.3*  MCV 70.9* 71.7* 73.1* 71.7* 72.6* 72.2* 70.4*  PLT 394 317 326 362 412* 441* 867*   Basic Metabolic Panel: Recent Labs  Lab 02/24/18 0555 02/25/18 0825 02/26/18 0612 02/27/18 0530 02/28/18 0519  NA 136 138 139 140 138  K 3.8 4.0 3.4* 3.8 3.3*  CL 106 110 108 105 102  CO2 21* 21* 23 27 27   GLUCOSE 235* 177* 107* 92 88  BUN 24* 18 18 16 13   CREATININE 0.77 0.67 0.75 0.61 0.56  CALCIUM 7.7* 8.4* 8.5* 8.3* 8.1*  MG  --  2.1  --   --   --    GFR: Estimated Creatinine Clearance: 45.9 mL/min (by C-G formula based on SCr of 0.56 mg/dL). Liver Function Tests: Recent Labs  Lab 02/23/18 2005  AST 40  ALT 36  ALKPHOS 79  BILITOT 1.1  PROT 7.4  ALBUMIN 3.5   No results for input(s): LIPASE, AMYLASE in the last 168 hours. No results for input(s): AMMONIA in the last 168 hours. Coagulation Profile: Recent Labs  Lab 02/25/18 1626  INR 1.13   Cardiac Enzymes: Recent Labs  Lab 02/23/18 2005 02/24/18 0006 02/24/18 0555 02/24/18 1627  TROPONINI 0.16* 0.10* 0.10* 0.04*   BNP (last 3 results) No results for input(s): PROBNP in the last 8760 hours. HbA1C: No results for input(s): HGBA1C in the last 72 hours. CBG: Recent Labs  Lab 02/25/18 0828 02/26/18 0820 02/27/18 0748 02/28/18 0818  GLUCAP 158* 99 91 89   Lipid Profile: No results for input(s): CHOL, HDL, LDLCALC, TRIG, CHOLHDL, LDLDIRECT in the last 72 hours. Thyroid Function Tests: No results for input(s): TSH, T4TOTAL, FREET4, T3FREE, THYROIDAB in the last 72 hours. Anemia Panel: No results for input(s): VITAMINB12, FOLATE, FERRITIN, TIBC, IRON, RETICCTPCT in the last 72 hours. Sepsis Labs: Recent Labs  Lab 02/23/18 2005 02/24/18 0006 02/24/18 0555    LATICACIDVEN 2.7* 3.0* 1.7    Recent Results (from the past 240 hour(s))  Culture, blood (Routine X 2) w Reflex to ID Panel     Status: None   Collection Time: 02/23/18  8:03 PM  Result Value Ref Range Status   Specimen Description LEFT ANTECUBITAL  Final   Special Requests   Final    BOTTLES DRAWN AEROBIC AND ANAEROBIC Blood Culture adequate volume   Culture   Final    NO GROWTH 5 DAYS Performed at [facility], [address] [place], [place] [address]    Report Status 02/28/2018 FINAL  Final  Culture, blood (Routine X 2) w Reflex to ID Panel     Status: None   Collection Time: 02/23/18  8:33 PM  Result Value Ref Range Status   Specimen Description BLOOD LEFT HAND  Final  Special Requests   Final    BOTTLES DRAWN AEROBIC AND ANAEROBIC Blood Culture adequate volume   Culture   Final    NO GROWTH 5 DAYS Performed at [facility], [address] [place], [place] [address]    Report Status 02/28/2018 FINAL  Final  MRSA PCR Screening     Status: None   Collection Time: 02/24/18  6:22 AM  Result Value Ref Range Status   MRSA by PCR NEGATIVE NEGATIVE Final    Comment:        The GeneXpert MRSA Assay (FDA approved for NASAL specimens only), is one component of a comprehensive MRSA colonization surveillance program. It is not intended to diagnose MRSA infection nor to guide or monitor treatment for MRSA infections. Performed at [facility], [address] [place], [place] [address]   Culture, sputum-assessment     Status: None   Collection Time: 02/24/18  9:00 PM  Result Value Ref Range Status   Specimen Description EXPECTORATED SPUTUM  Final   Special Requests NONE  Final   Sputum evaluation   Final    Sputum specimen not acceptable for testing.  Please recollect.   NURSE NOTIFIED AT 0111 ON 02/25/2018 BY EVA Performed at [facility], [address], [place], [place] [address]    Report Status 02/25/2018 FINAL  Final         Radiology Studies: Dg Chest Port 1  View  Result Date: 02/27/2018 CLINICAL DATA:  85 year old female with shortness of breath. EXAM: PORTABLE CHEST 1 VIEW COMPARISON:  02/23/2018 and earlier. FINDINGS: Portable AP upright view at 1743 hours. Progressed and moderate to severe diffuse pulmonary interstitial opacity. Underlying chronic architectural distortion about the superior left hilum. Small bilateral pleural effusions. Stable cardiac size and mediastinal contours. No pneumothorax. Visualized tracheal air column is within normal limits. Paucity of bowel gas. Stable cholecystectomy clips. No acute osseous abnormality identified. IMPRESSION: Progressed interstitial opacity favored due to acute pulmonary edema with small bilateral pleural effusions. Severe viral/atypical respiratory infection felt less likely. Electronically Signed   By: Genevie Ann M.D.   On: 02/27/2018 19:42        Scheduled Meds: . azithromycin  500 mg Oral Daily  . donepezil  5 mg Oral QHS  . folic acid  1 mg Oral Daily  . furosemide  20 mg Intravenous Daily  . guaiFENesin  1,200 mg Oral BID  . mouth rinse  15 mL Mouth Rinse BID  . mometasone-formoterol  2 puff Inhalation BID  . oxybutynin  5 mg Oral QID  . pantoprazole  40 mg Oral Daily  . potassium chloride  40 mEq Oral Once  . predniSONE  40 mg Oral Q breakfast  . sodium chloride flush  3 mL Intravenous Q12H   Continuous Infusions: . sodium chloride Stopped (02/25/18 2253)  . aztreonam 2 g (02/28/18 1449)     LOS: 5 days    Time spent: 6mins    Kathie Dike, MD [facility]   If 7PM-7AM, please contact night-coverage [email or web address]  02/28/2018, 6:32 PM

## 2018-02-28 NOTE — Progress Notes (Signed)
Physical Therapy Treatment Patient Details Name: [name] MRN: [id number] DOB: 03/10/1932 Today's Date: 02/28/2018    History of Present Illness [name] is a 85 y.o. female with medical history significant for dementia, emphysema, peripheral arterial disease, and lung cancer status post radiation, now presenting to the emergency department for evaluation of acute respiratory distress.  Daughter is at the bedside and assists with the history.  Patient had recently been started on ciprofloxacin for suspected UTI, and then started on azithromycin 2 weeks ago for a respiratory illness.  She seemed to be improving in recent days before acutely worsening today.  Patient was noted to have labored respirations, seemed to be gasping for breath per report of her daughter, and EMS was called.  Patient was given 125 mg of IV Solu-Medrol, multiple neb treatments, and started on CPAP.  She was brought into the ED.  Family reports that the patient has chronic lower extremity edema that seems essentially unchanged.  Patient shakes her head "no" when asked about chest pain.  Daughter is aware that the patient is critically ill and would require intubation if she does not improve, or if she worsens on BiPAP; daughter confirms that patient is full code.    PT Comments    Pt supine in bed with family/friends in room, willing to participate with therapy today.  Pt improving mobility with increased independence with bed mobility, only cueing required for hand placement to assist with scooting to EOB.  Min A with sit to stands, multiple attempts that improved following cueing for hand placement to assist with standing.  Increased distance with gait training today, used RW for safety with min cueing to standing within walker.  No LOB during gait.  EOS pt left in chair with family/friends in room, call bell within reach, chair alarm set and RN aware of status.     Follow Up Recommendations  SNF;Supervision/Assistance - 24 hour     Equipment Recommendations  None recommended by PT    Recommendations for Other Services       Precautions / Restrictions Precautions Precautions: Fall Restrictions Weight Bearing Restrictions: No    Mobility  Bed Mobility Overal bed mobility: Modified Independent Bed Mobility: Supine to Sit     Supine to sit: Supervision     General bed mobility comments: increased time, cueing for hand placement to assist with scooting to edge of bed  Transfers Overall transfer level: Modified independent Equipment used: Rolling walker (2 wheeled) Transfers: Sit to/from Stand Sit to Stand: Min guard         General transfer comment: cueing for handplacement to assist with sit to stand.  No LOB upon initial standing or during gait  Ambulation/Gait Ambulation/Gait assistance: Min assist Gait Distance (Feet): 25 Feet Assistive device: Rolling walker (2 wheeled) Gait Pattern/deviations: Decreased step length - right;Decreased step length - left;Decreased stride length;Shuffle Gait velocity: decreased   General Gait Details: slow and labored, no LOB   Stairs             Wheelchair Mobility    Modified Rankin (Stroke Patients Only)       Balance                                            Cognition Arousal/Alertness: Awake/alert Behavior During Therapy: WFL for tasks assessed/performed Overall Cognitive Status: History of cognitive impairments -  at baseline                                        Exercises      General Comments        Pertinent Vitals/Pain Pain Assessment: No/denies pain    Home Living                      Prior Function            PT Goals (current goals can now be found in the care plan section)      Frequency    Min 2X/week      PT Plan Current plan remains appropriate    Co-evaluation              AM-PAC PT "6 Clicks" Mobility    Outcome Measure  Help needed turning from your back to your side while in a flat bed without using bedrails?: A Little Help needed moving from lying on your back to sitting on the side of a flat bed without using bedrails?: A Little Help needed moving to and from a bed to a chair (including a wheelchair)?: A Lot Help needed standing up from a chair using your arms (e.g., wheelchair or bedside chair)?: A Lot Help needed to walk in hospital room?: A Lot Help needed climbing 3-5 steps with a railing? : A Lot 6 Click Score: 14    End of Session Equipment Utilized During Treatment: Oxygen;Gait belt(2 Liters O2 A with nasal canal) Activity Tolerance: Patient tolerated treatment well;Patient limited by fatigue Patient left: in chair;with call bell/phone within reach;with chair alarm set;with family/visitor present Nurse Communication: Mobility status PT Visit Diagnosis: Unsteadiness on feet (R26.81);Other abnormalities of gait and mobility (R26.89);Muscle weakness (generalized) (M62.81)     Time: 1040-1105 PT Time Calculation (min) (ACUTE ONLY): 25 min  Charges:                        Ihor Austin, LPTA; CBIS [phone]  Aldona Lento 02/28/2018, 11:41 AM

## 2018-03-01 ENCOUNTER — Inpatient Hospital Stay (HOSPITAL_COMMUNITY): Payer: Medicare Other

## 2018-03-01 DIAGNOSIS — I340 Nonrheumatic mitral (valve) insufficiency: Secondary | ICD-10-CM

## 2018-03-01 LAB — CBC
HCT: 29.3 % — ABNORMAL LOW (ref 36.0–46.0)
Hemoglobin: 8.6 g/dL — ABNORMAL LOW (ref 12.0–15.0)
MCH: 20.7 pg — ABNORMAL LOW (ref 26.0–34.0)
MCHC: 29.4 g/dL — ABNORMAL LOW (ref 30.0–36.0)
MCV: 70.6 fL — ABNORMAL LOW (ref 80.0–100.0)
Platelets: 475 10*3/uL — ABNORMAL HIGH (ref 150–400)
RBC: 4.15 MIL/uL (ref 3.87–5.11)
RDW: 19.2 % — ABNORMAL HIGH (ref 11.5–15.5)
WBC: 21.3 10*3/uL — ABNORMAL HIGH (ref 4.0–10.5)
nRBC: 0.2 % (ref 0.0–0.2)

## 2018-03-01 LAB — BASIC METABOLIC PANEL
Anion gap: 8 (ref 5–15)
BUN: 13 mg/dL (ref 8–23)
CO2: 26 mmol/L (ref 22–32)
Calcium: 7.7 mg/dL — ABNORMAL LOW (ref 8.9–10.3)
Chloride: 104 mmol/L (ref 98–111)
Creatinine, Ser: 0.59 mg/dL (ref 0.44–1.00)
GFR calc Af Amer: >60 mL/min (ref >60)
GFR calc non Af Amer: >60 mL/min (ref >60)
Glucose, Bld: 91 mg/dL (ref 70–99)
Potassium: 3 mmol/L — ABNORMAL LOW (ref 3.5–5.1)
Sodium: 138 mmol/L (ref 135–145)

## 2018-03-01 LAB — ECHOCARDIOGRAM COMPLETE
Height: 65 in
Weight: 1890.66 oz

## 2018-03-01 LAB — GLUCOSE, CAPILLARY
GLUCOSE-CAPILLARY: 98 mg/dL (ref 70–99)
Glucose-Capillary: 88 mg/dL (ref 70–99)
Glucose-Capillary: 131 mg/dL — ABNORMAL HIGH (ref 70–99)

## 2018-03-01 MED ORDER — POTASSIUM CHLORIDE CRYS ER 20 MEQ PO TBCR
40.0000 meq | EXTENDED_RELEASE_TABLET | Freq: Once | ORAL | Status: AC
  Administered 2018-03-01: 40 meq via ORAL
  Filled 2018-03-01: qty 2

## 2018-03-01 NOTE — Progress Notes (Signed)
PROGRESS NOTE    [name]  ZSW:[id number] DOB: 06-18-1932 DOA: 02/23/2018 PCP: Claretta Fraise, MD    Brief Narrative:  85 year old female with history of dementia, COPD, was brought to the hospital with acute respiratory distress.  Found to have COPD exacerbation as well as pneumonia.  She was started on BiPAP for respiratory distress and received antibiotics as well as IV steroids.  Lactic acid was elevated on admission, but this improved with hydration.  She is continued on IV antibiotics.  Overall respiratory status is slowly improving.  Assessment & Plan:   Principal Problem:   Sepsis due to pneumonia [facility]) Active Problems:   PAD (peripheral artery disease) (HCC)   COPD exacerbation (HCC)   Lung cancer ([place])   Acute respiratory distress   Hypokalemia   Elevated troponin  1. Sepsis secondary to pneumonia.  RESOLVED.  Blood cultures have not shown any growth.  On IV antibiotics.  Hemodynamics are stabilizing.  Lactic acid improved.  Since chest x-ray indicates pulmonary edema versus atypical infection, will add azithromycin. 2. COPD with acute exacerbation.  Wheezing improving.  Currently on prednisone taper.  Continue on bronchodilators. 3. Acute respiratory failure with hypoxia.  Initially required BiPAP.  She has since been weaned to nasal cannula.  She is currently on 4 L oxygen, but becomes hypoxic when attempts are made to wean further.  Repeat chest x-ray shows persistent infiltrates concerning for pulmonary edema versus atypical infection.   4. Congestive heart failure.  Patient has evidence of volume overload and pulmonary edema.  Continue on IV diuresis.  Echocardiogram pending. 5. Leukocytosis.  Related to steroids.  Does not appear septic or toxic. 6. Demand ischemia.  Mild elevation of troponin likely related to demand ischemia.  No complaints of chest pain.  Continue to monitor. 7. Hypokalemia.  Replaced.  Magnesium normal 8. Dementia.  Continue on Aricept.     9. Iron deficiency anemia. Stool occult blood has been ordered.  She does not have any evidence of gross bleeding.  She received a dose of intravenous iron. Continue to monitor.  Folate is also low and is being replaced.  Hemoglobin has been stable  DVT prophylaxis: SCDs Code Status: Full code Family Communication: Discussed with daughter at the bedside Disposition Plan: Skilled nursing facility placement on discharge  Consultants:     Procedures:     Antimicrobials:   Azactam 2/14 >  Vancomycin 2/14 >2/16  Azithromycin 2/19 >  Subjective:  Pt says that she feels a little better today.  She has not been ambulating.    Objective: Vitals:   02/28/18 2121 03/01/18 0500 03/01/18 0553 03/01/18 0744  BP: (!) 120/53  (!) 128/46   Pulse: 88  64   Resp: 18  18   Temp: 98 F (36.7 C)  98.6 F (37 C)   TempSrc: Oral  Oral   SpO2: 94%  100% 100%  Weight:  53.6 kg    Height:        Intake/Output Summary (Last 24 hours) at 03/01/2018 0953 Last data filed at 02/28/2018 1947 Gross per 24 hour  Intake 240 ml  Output 1000 ml  Net -760 ml   Filed Weights   02/27/18 0500 02/28/18 0500 03/01/18 0500  Weight: 55.3 kg 54.6 kg 53.6 kg   Examination:  General exam: Alert, awake, oriented x 3 Respiratory system: bibasilar crackles. Respiratory effort normal. Cardiovascular system:normal s1,s2 sounds. No murmurs, rubs, gallops. Gastrointestinal system: Abdomen is nondistended, soft and nontender. No organomegaly or masses  felt. Normal bowel sounds heard. Central nervous system: Alert and oriented. No focal neurological deficits. Extremities: 1+ edema bilaterally Skin: No rashes, lesions or ulcers Psychiatry: Judgement and insight appear normal. Mood & affect appropriate.   Data Reviewed: I have personally reviewed following labs and imaging studies  CBC: Recent Labs  Lab 02/23/18 2005 02/24/18 0555  02/25/18 1626 02/26/18 0612 02/27/18 0530 02/28/18 0519 03/01/18 0640   WBC 20.1* 13.3*   < > 21.9* 24.4* 17.7* 17.9* 21.3*  NEUTROABS 17.3* 12.7*  --   --   --   --   --   --   HGB 11.3* 8.7*   < > 7.9* 8.3* 8.6* 8.5* 8.6*  HCT 38.9 29.4*   < > 27.3* 29.1* 29.1* 29.3* 29.3*  MCV 70.9* 71.7*   < > 71.7* 72.6* 72.2* 70.4* 70.6*  PLT 394 317   < > 362 412* 441* 453* 475*   < > = values in this interval not displayed.   Basic Metabolic Panel: Recent Labs  Lab 02/25/18 0825 02/26/18 0612 02/27/18 0530 02/28/18 0519 03/01/18 0640  NA 138 139 140 138 138  K 4.0 3.4* 3.8 3.3* 3.0*  CL 110 108 105 102 104  CO2 21* 23 27 27 26   GLUCOSE 177* 107* 92 88 91  BUN 18 18 16 13 13   CREATININE 0.67 0.75 0.61 0.56 0.59  CALCIUM 8.4* 8.5* 8.3* 8.1* 7.7*  MG 2.1  --   --   --   --    GFR: Estimated Creatinine Clearance: 45.1 mL/min (by C-G formula based on SCr of 0.59 mg/dL). Liver Function Tests: Recent Labs  Lab 02/23/18 2005  AST 40  ALT 36  ALKPHOS 79  BILITOT 1.1  PROT 7.4  ALBUMIN 3.5   No results for input(s): LIPASE, AMYLASE in the last 168 hours. No results for input(s): AMMONIA in the last 168 hours. Coagulation Profile: Recent Labs  Lab 02/25/18 1626  INR 1.13   Cardiac Enzymes: Recent Labs  Lab 02/23/18 2005 02/24/18 0006 02/24/18 0555 02/24/18 1627  TROPONINI 0.16* 0.10* 0.10* 0.04*   BNP (last 3 results) No results for input(s): PROBNP in the last 8760 hours. HbA1C: No results for input(s): HGBA1C in the last 72 hours. CBG: Recent Labs  Lab 02/25/18 0828 02/26/18 0820 02/27/18 0748 02/28/18 0818 03/01/18 0746  GLUCAP 158* 99 91 89 88   Lipid Profile: No results for input(s): CHOL, HDL, LDLCALC, TRIG, CHOLHDL, LDLDIRECT in the last 72 hours. Thyroid Function Tests: No results for input(s): TSH, T4TOTAL, FREET4, T3FREE, THYROIDAB in the last 72 hours. Anemia Panel: No results for input(s): VITAMINB12, FOLATE, FERRITIN, TIBC, IRON, RETICCTPCT in the last 72 hours. Sepsis Labs: Recent Labs  Lab 02/23/18 2005  02/24/18 0006 02/24/18 0555  LATICACIDVEN 2.7* 3.0* 1.7    Recent Results (from the past 240 hour(s))  Culture, blood (Routine X 2) w Reflex to ID Panel     Status: None   Collection Time: 02/23/18  8:03 PM  Result Value Ref Range Status   Specimen Description LEFT ANTECUBITAL  Final   Special Requests   Final    BOTTLES DRAWN AEROBIC AND ANAEROBIC Blood Culture adequate volume   Culture   Final    NO GROWTH 5 DAYS Performed at [facility], [address], [place], [place] [address]    Report Status 02/28/2018 FINAL  Final  Culture, blood (Routine X 2) w Reflex to ID Panel     Status: None   Collection Time:  02/23/18  8:33 PM  Result Value Ref Range Status   Specimen Description BLOOD LEFT HAND  Final   Special Requests   Final    BOTTLES DRAWN AEROBIC AND ANAEROBIC Blood Culture adequate volume   Culture   Final    NO GROWTH 5 DAYS Performed at [facility], [address], [place], [place] [address]    Report Status 02/28/2018 FINAL  Final  MRSA PCR Screening     Status: None   Collection Time: 02/24/18  6:22 AM  Result Value Ref Range Status   MRSA by PCR NEGATIVE NEGATIVE Final    Comment:        The GeneXpert MRSA Assay (FDA approved for NASAL specimens only), is one component of a comprehensive MRSA colonization surveillance program. It is not intended to diagnose MRSA infection nor to guide or monitor treatment for MRSA infections. Performed at [facility], [address] [place], [place] [address]   Culture, sputum-assessment     Status: None   Collection Time: 02/24/18  9:00 PM  Result Value Ref Range Status   Specimen Description EXPECTORATED SPUTUM  Final   Special Requests NONE  Final   Sputum evaluation   Final    Sputum specimen not acceptable for testing.  Please recollect.   NURSE NOTIFIED AT 0111 ON 02/25/2018 BY EVA Performed at [facility], [address] [place], [place] [address]    Report Status 02/25/2018 FINAL  Final    Radiology  Studies: Dg Chest Port 1 View  Result Date: 02/27/2018 CLINICAL DATA:  85 year old female with shortness of breath. EXAM: PORTABLE CHEST 1 VIEW COMPARISON:  02/23/2018 and earlier. FINDINGS: Portable AP upright view at 1743 hours. Progressed and moderate to severe diffuse pulmonary interstitial opacity. Underlying chronic architectural distortion about the superior left hilum. Small bilateral pleural effusions. Stable cardiac size and mediastinal contours. No pneumothorax. Visualized tracheal air column is within normal limits. Paucity of bowel gas. Stable cholecystectomy clips. No acute osseous abnormality identified. IMPRESSION: Progressed interstitial opacity favored due to acute pulmonary edema with small bilateral pleural effusions. Severe viral/atypical respiratory infection felt less likely. Electronically Signed   By: Genevie Ann M.D.   On: 02/27/2018 19:42   Scheduled Meds: . azithromycin  500 mg Oral Daily  . donepezil  5 mg Oral QHS  . folic acid  1 mg Oral Daily  . furosemide  20 mg Intravenous Daily  . guaiFENesin  1,200 mg Oral BID  . mouth rinse  15 mL Mouth Rinse BID  . mometasone-formoterol  2 puff Inhalation BID  . oxybutynin  5 mg Oral QID  . pantoprazole  40 mg Oral Daily  . potassium chloride  20 mEq Oral Daily  . potassium chloride  40 mEq Oral Once  . predniSONE  40 mg Oral Q breakfast  . sodium chloride flush  3 mL Intravenous Q12H   Continuous Infusions: . sodium chloride Stopped (02/25/18 2253)  . aztreonam 2 g (03/01/18 0553)    LOS: 6 days   Time spent: 73mins  Irwin Brakeman, MD [facility]  If 7PM-7AM, please contact night-coverage [email or web address]  03/01/2018, 9:53 AM

## 2018-03-01 NOTE — Progress Notes (Signed)
*  PRELIMINARY RESULTS* Echocardiogram 2D Echocardiogram has been performed.  Leavy Cella 03/01/2018, 11:02 AM

## 2018-03-01 NOTE — Progress Notes (Signed)
PT Cancellation Note  Patient Details Name: [name] MRN: [id number] DOB: 06/01/1932   Cancelled Treatment:    Reason Eval/Treat Not Completed: Patient declined, no reason specified Attempted PT session, pt supine in bed resting and stated she has been up all day and is tired.  Requested hold on therapy due to later in day and fatigue.  Assured call bell within reach prior leaving room.  [address], LPTA; [name] 03/01/2018, 4:50 PM

## 2018-03-01 NOTE — Clinical Social Work Note (Signed)
Kayla at [facility] advised of planned discharge on 03/02/2018 if patient remain medically stable.    Quindon Denker, Clydene Pugh, LCSW

## 2018-03-02 DIAGNOSIS — I509 Heart failure, unspecified: Secondary | ICD-10-CM | POA: Diagnosis not present

## 2018-03-02 DIAGNOSIS — R918 Other nonspecific abnormal finding of lung field: Secondary | ICD-10-CM | POA: Diagnosis not present

## 2018-03-02 DIAGNOSIS — R630 Anorexia: Secondary | ICD-10-CM | POA: Diagnosis not present

## 2018-03-02 DIAGNOSIS — R0989 Other specified symptoms and signs involving the circulatory and respiratory systems: Secondary | ICD-10-CM | POA: Diagnosis not present

## 2018-03-02 DIAGNOSIS — A419 Sepsis, unspecified organism: Secondary | ICD-10-CM | POA: Diagnosis not present

## 2018-03-02 DIAGNOSIS — J439 Emphysema, unspecified: Secondary | ICD-10-CM | POA: Diagnosis not present

## 2018-03-02 DIAGNOSIS — J9621 Acute and chronic respiratory failure with hypoxia: Secondary | ICD-10-CM | POA: Diagnosis not present

## 2018-03-02 DIAGNOSIS — J449 Chronic obstructive pulmonary disease, unspecified: Secondary | ICD-10-CM | POA: Diagnosis not present

## 2018-03-02 DIAGNOSIS — C3490 Malignant neoplasm of unspecified part of unspecified bronchus or lung: Secondary | ICD-10-CM | POA: Diagnosis not present

## 2018-03-02 DIAGNOSIS — R7989 Other specified abnormal findings of blood chemistry: Secondary | ICD-10-CM | POA: Diagnosis not present

## 2018-03-02 DIAGNOSIS — I739 Peripheral vascular disease, unspecified: Secondary | ICD-10-CM | POA: Diagnosis not present

## 2018-03-02 DIAGNOSIS — R0603 Acute respiratory distress: Secondary | ICD-10-CM | POA: Diagnosis not present

## 2018-03-02 DIAGNOSIS — J441 Chronic obstructive pulmonary disease with (acute) exacerbation: Secondary | ICD-10-CM | POA: Diagnosis not present

## 2018-03-02 DIAGNOSIS — F0390 Unspecified dementia without behavioral disturbance: Secondary | ICD-10-CM | POA: Diagnosis not present

## 2018-03-02 DIAGNOSIS — I959 Hypotension, unspecified: Secondary | ICD-10-CM | POA: Diagnosis not present

## 2018-03-02 DIAGNOSIS — J189 Pneumonia, unspecified organism: Secondary | ICD-10-CM | POA: Diagnosis not present

## 2018-03-02 DIAGNOSIS — D509 Iron deficiency anemia, unspecified: Secondary | ICD-10-CM | POA: Diagnosis not present

## 2018-03-02 DIAGNOSIS — Z7401 Bed confinement status: Secondary | ICD-10-CM | POA: Diagnosis not present

## 2018-03-02 DIAGNOSIS — R627 Adult failure to thrive: Secondary | ICD-10-CM | POA: Diagnosis not present

## 2018-03-02 DIAGNOSIS — R1310 Dysphagia, unspecified: Secondary | ICD-10-CM | POA: Diagnosis not present

## 2018-03-02 DIAGNOSIS — E876 Hypokalemia: Secondary | ICD-10-CM | POA: Diagnosis not present

## 2018-03-02 DIAGNOSIS — I501 Left ventricular failure: Secondary | ICD-10-CM | POA: Diagnosis not present

## 2018-03-02 DIAGNOSIS — R404 Transient alteration of awareness: Secondary | ICD-10-CM | POA: Diagnosis not present

## 2018-03-02 DIAGNOSIS — A403 Sepsis due to Streptococcus pneumoniae: Secondary | ICD-10-CM | POA: Diagnosis not present

## 2018-03-02 LAB — CREATININE, SERUM
Creatinine, Ser: 0.56 mg/dL (ref 0.44–1.00)
GFR calc Af Amer: >60 mL/min (ref >60)
GFR calc non Af Amer: >60 mL/min (ref >60)

## 2018-03-02 LAB — GLUCOSE, CAPILLARY
Glucose-Capillary: 78 mg/dL (ref 70–99)
Glucose-Capillary: 87 mg/dL (ref 70–99)

## 2018-03-02 MED ORDER — PREDNISONE 20 MG PO TABS: 20.0000 mg | ORAL_TABLET | Freq: Every day | ORAL | 0 refills | Status: AC

## 2018-03-02 MED ORDER — POTASSIUM CHLORIDE CRYS ER 10 MEQ PO TBCR: 10.0000 meq | EXTENDED_RELEASE_TABLET | ORAL | 5 refills | Status: AC

## 2018-03-02 MED ORDER — FOLIC ACID 1 MG PO TABS: 1.0000 mg | ORAL_TABLET | Freq: Every day | ORAL | Status: AC

## 2018-03-02 MED ORDER — DOXYCYCLINE HYCLATE 100 MG PO CAPS: 100.0000 mg | ORAL_CAPSULE | Freq: Two times a day (BID) | ORAL | 0 refills | Status: AC

## 2018-03-02 MED ORDER — FUROSEMIDE 20 MG PO TABS: 20.0000 mg | ORAL_TABLET | ORAL | 5 refills | Status: AC

## 2018-03-02 MED ORDER — ASPIRIN EC 81 MG PO TBEC: 81.0000 mg | DELAYED_RELEASE_TABLET | Freq: Every day | ORAL | Status: AC

## 2018-03-02 MED ORDER — ALPRAZOLAM 0.5 MG PO TABS: 0.5000 mg | ORAL_TABLET | Freq: Two times a day (BID) | ORAL | 0 refills | Status: AC | PRN

## 2018-03-02 MED ORDER — ACETAMINOPHEN 325 MG PO TABS: 650.0000 mg | ORAL_TABLET | Freq: Four times a day (QID) | ORAL | Status: AC | PRN

## 2018-03-02 MED ORDER — GUAIFENESIN ER 600 MG PO TB12: 600.0000 mg | ORAL_TABLET | Freq: Two times a day (BID) | ORAL | 0 refills | Status: AC

## 2018-03-02 NOTE — Progress Notes (Signed)
Called report to [facility] and spoke with [facility] LPN. Questions asked and answered. Awaiting EMS, called by Nira Conn with SW.

## 2018-03-02 NOTE — Clinical Social Work Placement (Signed)
   CLINICAL SOCIAL WORK PLACEMENT  NOTE  Date:  03/02/2018  Patient Details  Name: [name] MRN: [id number] Date of Birth: 1932-05-23  Clinical Social Work is seeking post-discharge placement for this patient at the [place] level of care (*CSW will initial, date and re-position this form in  chart as items are completed):  Yes   Patient/family provided with [facility] Work Department's list of facilities offering this level of care within the geographic area requested by the patient (or if unable, by the patient's family).  Yes   Patient/family informed of their freedom to choose among providers that offer the needed level of care, that participate in Medicare, Medicaid or managed care program needed by the patient, have an available bed and are willing to accept the patient.  Yes   Patient/family informed of [facility]'s ownership interest in [facility] and [facility], as well as of the fact that they are under no obligation to receive care at these facilities.  PASRR submitted to EDS on       PASRR number received on       Existing PASRR number confirmed on 02/27/18     FL2 transmitted to all facilities in geographic area requested by pt/family on 02/27/18     FL2 transmitted to all facilities within larger geographic area on       Patient informed that his/her managed care company has contracts with or will negotiate with certain facilities, including the following:        Yes   Patient/family informed of bed offers received.  Patient chooses bed at [facility]     Physician recommends and patient chooses bed at      Patient to be transferred to [facility] on 02/28/18.  Patient to be transferred to facility by [facility]     Patient family notified on 02/28/18 of transfer.  Name of family member notified:  Daughter     PHYSICIAN       Additional Comment:  Message left notifying facility of discharge [place] was  advised on 03/01/18 of discharge on 03/02/2018). Discharge clinicals sent.  LCSW signing off.   _______________________________________________ Ambrose Pancoast D, LCSW 03/02/2018, 1:10 PM

## 2018-03-02 NOTE — Discharge Instructions (Signed)
°  IMPORTANT INFORMATION: PAY CLOSE ATTENTION   PHYSICIAN DISCHARGE INSTRUCTIONS  Follow with Primary care provider  Claretta Fraise, MD  and other consultants as instructed your Hospitalist Physician  SEEK MEDICAL CARE OR RETURN TO EMERGENCY ROOM IF SYMPTOMS COME BACK, WORSEN OR NEW PROBLEM DEVELOPS.   Please note: You were cared for by a hospitalist during your hospital stay. Every effort will be made to forward records to your primary care provider.  You can request that your primary care provider send for your hospital records if they have not received them.  Once you are discharged, your primary care physician will handle any further medical issues. Please note that NO REFILLS for any discharge medications will be authorized once you are discharged, as it is imperative that you return to your primary care physician (or establish a relationship with a primary care physician if you do not have one) for your post hospital discharge needs so that they can reassess your need for medications and monitor your lab values.  Please get a complete blood count and chemistry panel checked by your Primary MD at your next visit, and again as instructed by your Primary MD.  Get Medicines reviewed and adjusted: Please take all your medications with you for your next visit with your Primary MD  Laboratory/radiological data: Please request your Primary MD to go over all hospital tests and procedure/radiological results at the follow up, please ask your primary care provider to get all Hospital records sent to his/her office.  In some cases, they will be blood work, cultures and biopsy results pending at the time of your discharge. Please request that your primary care provider follow up on these results.  If you are diabetic, please bring your blood sugar readings with you to your follow up appointment with primary care.    Please call and make your follow up appointments as soon as possible.    Also Note  the following: If you experience worsening of your admission symptoms, develop shortness of breath, life threatening emergency, suicidal or homicidal thoughts you must seek medical attention immediately by calling 911 or calling your MD immediately  if symptoms less severe.  You must read complete instructions/literature along with all the possible adverse reactions/side effects for all the Medicines you take and that have been prescribed to you. Take any new Medicines after you have completely understood and accpet all the possible adverse reactions/side effects.   Do not drive when taking Pain medications or sleeping medications (Benzodiazepines)  Do not take more than prescribed Pain, Sleep and Anxiety Medications. It is not advisable to combine anxiety,sleep and pain medications without talking with your primary care practitioner  Special Instructions: If you have smoked or chewed Tobacco  in the last 2 yrs please stop smoking, stop any regular Alcohol  and or any Recreational drug use.  Wear Seat belts while driving.

## 2018-03-02 NOTE — Discharge Summary (Addendum)
Physician Discharge Summary  [name] ZOX:[id number] DOB: 06/26/1932 DOA: 02/23/2018  PCP: Claretta Fraise, MD  Admit date: 02/23/2018 Discharge date: 03/02/2018  Disposition: SNF [facility]  Recommendations for Outpatient Follow-up:  1. Follow up with PCP as scheduled 03/09/18    Discharge Condition: STABLE   CODE STATUS: FULL    Brief Hospitalization Summary: Please see all hospital notes, images, labs for full details of the hospitalization. Dr. Criss Rosales HPI: [name] is a 85 y.o. female with medical history significant for dementia, emphysema, peripheral arterial disease, and lung cancer status post radiation, now presenting to the emergency department for evaluation of acute respiratory distress.  Daughter is at the bedside and assists with the history.  Patient had recently been started on ciprofloxacin for suspected UTI, and then started on azithromycin 2 weeks ago for a respiratory illness.  She seemed to be improving in recent days before acutely worsening today.  Patient was noted to have labored respirations, seemed to be gasping for breath per report of her daughter, and EMS was called.  Patient was given 125 mg of IV Solu-Medrol, multiple neb treatments, and started on CPAP.  She was brought into the ED.  Family reports that the patient has chronic lower extremity edema that seems essentially unchanged.  Patient shakes her head "no" when asked about chest pain.  Daughter is aware that the patient is critically ill and would require intubation if she does not improve, or if she worsens on BiPAP; daughter confirms that patient is full code.  ED Course: Upon arrival to the ED, patient is found to be afebrile, saturating adequately on BiPAP, tachypneic in the 30s, tachycardic in the 120s, and with blood pressure low 100s.  EKG features sinus tachycardia with rate 118 and QTc interval of 571 ms.  Chest x-ray is notable for diffuse increase in interstitial markings with patchy left  midlung and right basilar opacities concerning for edema versus infection.  Chemistry panel is notable for a potassium of 2.5.  CBC features a leukocytosis to 20,100.  Lactic acid is elevated to 2.7.  Troponin is elevated to 0.16.  BNP is elevated at 408.  Blood cultures were collected, 500 cc normal saline administered, and the patient was treated with Levaquin and vancomycin in the ED.  She was also given 30 mEq IV potassium and albuterol neb.  She went into SVT with a rate in the 150s, was given a dose of adenosine, and a dose of IV diltiazem.  Rate improved to the low 100s, SBP has been in the 90s.  ED physician discussed the case with critical care who felt that patient does not require ICU and hospitalists were asked to admit.  Brief Narrative:  85 year old female with history of dementia, COPD, was brought to the hospital with acute respiratory distress.  Found to have COPD exacerbation as well as pneumonia.  She was started on BiPAP for respiratory distress and received antibiotics as well as IV steroids.  Lactic acid was elevated on admission, but this improved with hydration.  She is continued on IV antibiotics.  Overall respiratory status is slowly improving.  Assessment & Plan:   Principal Problem:   Sepsis due to pneumonia [facility]) Active Problems:   PAD (peripheral artery disease) (HCC)   COPD exacerbation (HCC)   Lung cancer ([place])   Acute respiratory distress   Hypokalemia   Elevated troponin  1. Sepsis secondary to pneumonia.  RESOLVED.  Blood cultures have not shown any growth.  Pt  was treated with IV antibiotics.  Hemodynamics are stable.  Lactic acid improved.  2. COPD with acute exacerbation.  Wheezing resolved.  Currently on prednisone x 5 more doses.  Continue on bronchodilators. 3. Acute respiratory failure with hypoxia. RESOLVED.  Initially required BiPAP.  She has since been weaned to nasal cannula.  She is weaned down to 2 L oxygen.  Will attempt to wean to room air prior  to discharge if not able, will send to SNF on 2L/min.  4. Acute diastolic Congestive heart failure.  Patient had mild volume overload and pulmonary edema and treated with IV diuresis.  Echocardiogram below.  Resume oral lasix 20 mg QOD/with potassium starting 2/24.  5. Leukocytosis.  Related to steroids.  Does not appear septic or toxic. 6. Demand ischemia.  Mild elevation of troponin likely related to demand ischemia.  No complaints of chest pain.  Continue to monitor. 7. Hypokalemia.  Replaced.  Magnesium normal 8. Dementia.  Continue on Aricept.    9. Iron deficiency anemia.  She received a dose of intravenous iron.  Folate is also low and is being replaced.  Hemoglobin has been stable.  Resume home iron supplement.   DVT prophylaxis: SCDs Code Status: Full code Family Communication: Discussed with daughter Disposition Plan: Skilled nursing facility [facility] at [facility]  Antimicrobials:   Azactam 2/14 > 2/21  Vancomycin 2/14 >2/16  Azithromycin 2/19 > 2/21  Discharge Diagnoses:  Principal Problem:   Sepsis due to pneumonia [facility]) Active Problems:   PAD (peripheral artery disease) (HCC)   COPD exacerbation (HCC)   Lung cancer ([place])   Acute respiratory distress   Hypokalemia   Elevated troponin  Discharge Instructions: Discharge Instructions    (HEART FAILURE PATIENTS) Call MD:  Anytime you have any of the following symptoms: 1) 3 pound weight gain in 24 hours or 5 pounds in 1 week 2) shortness of breath, with or without a dry hacking cough 3) swelling in the hands, feet or stomach 4) if you have to sleep on extra pillows at night in order to breathe.   Complete by:  As directed    Call MD for:  difficulty breathing, headache or visual disturbances   Complete by:  As directed    Call MD for:  extreme fatigue   Complete by:  As directed    Call MD for:  persistant dizziness or light-headedness   Complete by:  As directed    Call MD for:  persistant nausea and vomiting    Complete by:  As directed    Increase activity slowly   Complete by:  As directed      Allergies as of 03/02/2018      Reactions   Codeine Anaphylaxis   Penicillins Anaphylaxis   Sulfa Antibiotics Anaphylaxis      Medication List    STOP taking these medications   azithromycin 250 MG tablet Commonly known as:  ZITHROMAX Z-PAK   ciprofloxacin 250 MG tablet Commonly known as:  CIPRO   clotrimazole 1 % cream Commonly known as:  LOTRIMIN   CRANBERRY PO   nitrofurantoin (macrocrystal-monohydrate) 100 MG capsule Commonly known as:  MACROBID   triamcinolone 0.1 % cream : eucerin Crea   Vitamin D 50 MCG (2000 UT) tablet     TAKE these medications   acetaminophen 325 MG tablet Commonly known as:  TYLENOL Take 2 tablets (650 mg total) by mouth every 6 (six) hours as needed for mild pain (or Fever >/= 101).  albuterol 108 (90 Base) MCG/ACT inhaler Commonly known as:  PROVENTIL HFA;VENTOLIN HFA Inhale 2 puffs into the lungs every 6 (six) hours as needed for wheezing or shortness of breath.   ALPRAZolam 0.5 MG tablet Commonly known as:  XANAX Take 1 tablet (0.5 mg total) by mouth 2 (two) times daily as needed for anxiety. TAKE (1) TABLET TWICE A DAY. What changed:    how much to take  how to take this  when to take this  reasons to take this   aspirin EC 81 MG tablet Take 1 tablet (81 mg total) by mouth daily. What changed:    medication strength  how much to take  when to take this   budesonide-formoterol 160-4.5 MCG/ACT inhaler Commonly known as:  SYMBICORT Inhale 2 puffs into the lungs 2 (two) times daily.   donepezil 5 MG tablet Commonly known as:  ARICEPT Take 1 tablet (5 mg total) by mouth at bedtime.   doxycycline 100 MG capsule Commonly known as:  VIBRAMYCIN Take 1 capsule (100 mg total) by mouth 2 (two) times daily for 3 days. Start taking on:  March 03, 5629   folic acid 1 MG tablet Commonly known as:  FOLVITE Take 1 tablet (1 mg  total) by mouth daily. Start taking on:  March 03, 2018   furosemide 20 MG tablet Commonly known as:  LASIX Take 1 tablet (20 mg total) by mouth every other day. Start taking on:  March 05, 2018 What changed:    when to take this  These instructions start on March 05, 2018. If you are unsure what to do until then, ask your doctor or other care provider.   guaiFENesin 600 MG 12 hr tablet Commonly known as:  MUCINEX Take 1 tablet (600 mg total) by mouth 2 (two) times daily for 5 days.   oxybutynin 5 MG tablet Commonly known as:  DITROPAN Take 1 tablet (5 mg total) by mouth 4 (four) times daily.   pantoprazole 40 MG tablet Commonly known as:  PROTONIX Take 1 tablet (40 mg total) by mouth daily.   potassium chloride 10 MEQ tablet Commonly known as:  K-DUR,KLOR-CON Take 1 tablet (10 mEq total) by mouth every other day. As a potassium supplement Start taking on:  March 05, 2018 What changed:    when to take this  These instructions start on March 05, 2018. If you are unsure what to do until then, ask your doctor or other care provider.   predniSONE 20 MG tablet Commonly known as:  DELTASONE Take 1 tablet (20 mg total) by mouth daily with breakfast for 5 days. Start taking on:  March 03, 2018       Contact information for follow-up providers    Claretta Fraise, MD. Go on 03/09/2018.   Specialty:  Family Medicine Why:  Hospital Follow Up as scheduled Contact information: [place] [address] [phone]            Contact information for after-discharge care    Destination    [facility] .   Service:  Skilled Nursing Contact information: [place] [phone]                 Allergies  Allergen Reactions  . Codeine Anaphylaxis  . Penicillins Anaphylaxis  . Sulfa Antibiotics Anaphylaxis   Allergies as of 03/02/2018      Reactions   Codeine Anaphylaxis   Penicillins  Anaphylaxis  Sulfa Antibiotics Anaphylaxis      Medication List    STOP taking these medications   azithromycin 250 MG tablet Commonly known as:  ZITHROMAX Z-PAK   ciprofloxacin 250 MG tablet Commonly known as:  CIPRO   clotrimazole 1 % cream Commonly known as:  LOTRIMIN   CRANBERRY PO   nitrofurantoin (macrocrystal-monohydrate) 100 MG capsule Commonly known as:  MACROBID   triamcinolone 0.1 % cream : eucerin Crea   Vitamin D 50 MCG (2000 UT) tablet     TAKE these medications   acetaminophen 325 MG tablet Commonly known as:  TYLENOL Take 2 tablets (650 mg total) by mouth every 6 (six) hours as needed for mild pain (or Fever >/= 101).   albuterol 108 (90 Base) MCG/ACT inhaler Commonly known as:  PROVENTIL HFA;VENTOLIN HFA Inhale 2 puffs into the lungs every 6 (six) hours as needed for wheezing or shortness of breath.   ALPRAZolam 0.5 MG tablet Commonly known as:  XANAX Take 1 tablet (0.5 mg total) by mouth 2 (two) times daily as needed for anxiety. TAKE (1) TABLET TWICE A DAY. What changed:    how much to take  how to take this  when to take this  reasons to take this   aspirin EC 81 MG tablet Take 1 tablet (81 mg total) by mouth daily. What changed:    medication strength  how much to take  when to take this   budesonide-formoterol 160-4.5 MCG/ACT inhaler Commonly known as:  SYMBICORT Inhale 2 puffs into the lungs 2 (two) times daily.   donepezil 5 MG tablet Commonly known as:  ARICEPT Take 1 tablet (5 mg total) by mouth at bedtime.   doxycycline 100 MG capsule Commonly known as:  VIBRAMYCIN Take 1 capsule (100 mg total) by mouth 2 (two) times daily for 3 days. Start taking on:  March 04, 2991   folic acid 1 MG tablet Commonly known as:  FOLVITE Take 1 tablet (1 mg total) by mouth daily. Start taking on:  March 03, 2018   furosemide 20 MG tablet Commonly known as:  LASIX Take 1 tablet (20 mg total) by mouth every other day. Start  taking on:  March 05, 2018 What changed:    when to take this  These instructions start on March 05, 2018. If you are unsure what to do until then, ask your doctor or other care provider.   guaiFENesin 600 MG 12 hr tablet Commonly known as:  MUCINEX Take 1 tablet (600 mg total) by mouth 2 (two) times daily for 5 days.   oxybutynin 5 MG tablet Commonly known as:  DITROPAN Take 1 tablet (5 mg total) by mouth 4 (four) times daily.   pantoprazole 40 MG tablet Commonly known as:  PROTONIX Take 1 tablet (40 mg total) by mouth daily.   potassium chloride 10 MEQ tablet Commonly known as:  K-DUR,KLOR-CON Take 1 tablet (10 mEq total) by mouth every other day. As a potassium supplement Start taking on:  March 05, 2018 What changed:    when to take this  These instructions start on March 05, 2018. If you are unsure what to do until then, ask your doctor or other care provider.   predniSONE 20 MG tablet Commonly known as:  DELTASONE Take 1 tablet (20 mg total) by mouth daily with breakfast for 5 days. Start taking on:  March 03, 2018      Procedures/Studies: Echocardiogram 2/20 IMPRESSIONS  1. The left ventricle has hyperdynamic systolic  function, with an ejection fraction of >65%. The cavity size was normal. There is mildly increased left ventricular wall thickness. Left ventricular diastolic Doppler parameters are consistent with impaired relaxation Elevated mean left atrial pressure.  2. There is chordal SAM in the setting of hyperdynamic LV. There is no significant obstructive gradient  3. The right ventricle has normal systolic function. The cavity was normal. There is no increase in right ventricular wall thickness.  4. Small pericardial effusion.  5. The pericardial effusion is posterior to the left ventricle.  6. The mitral valve is normal in structure. Mild thickening of the mitral valve leaflet. No evidence of mitral valve stenosis.  7. The tricuspid valve is  normal in structure.  8. The aortic valve is tricuspid Mild thickening of the aortic valve mild stenosis of the aortic valve.  9. The aortic root is normal in size and structure. 10. The interatrial septum was not well visualized.  Dg Chest Port 1 View  Result Date: 02/27/2018 CLINICAL DATA:  85 year old female with shortness of breath. EXAM: PORTABLE CHEST 1 VIEW COMPARISON:  02/23/2018 and earlier. FINDINGS: Portable AP upright view at 1743 hours. Progressed and moderate to severe diffuse pulmonary interstitial opacity. Underlying chronic architectural distortion about the superior left hilum. Small bilateral pleural effusions. Stable cardiac size and mediastinal contours. No pneumothorax. Visualized tracheal air column is within normal limits. Paucity of bowel gas. Stable cholecystectomy clips. No acute osseous abnormality identified. IMPRESSION: Progressed interstitial opacity favored due to acute pulmonary edema with small bilateral pleural effusions. Severe viral/atypical respiratory infection felt less likely. Electronically Signed   By: Genevie Ann M.D.   On: 02/27/2018 19:42   Dg Chest Port 1 View  Result Date: 02/23/2018 CLINICAL DATA:  Shortness of breath. EXAM: PORTABLE CHEST 1 VIEW COMPARISON:  08/24/2016 FINDINGS: The patient is slightly rotated to the left with unchanged cardiomediastinal silhouette. The lungs are hyperinflated. Chronic density is again seen in the left mid upper lung. There is additional superimposed patchy left midlung opacity which is new, and there is also patchy right basilar opacity. The interstitial markings are increased diffusely compared to the prior study. No definite pleural effusion or pneumothorax is identified. IMPRESSION: Diffusely increased interstitial markings with patchy left midlung and right basilar opacities which may reflect infection or edema. Electronically Signed   By: Logan Bores M.D.   On: 02/23/2018 21:04      Subjective: Pt reports that she  is feeling better.  No chest pain and no SOB.  She is eating a little better.   Discharge Exam: Vitals:   03/02/18 0603 03/02/18 0741  BP: (!) 126/45   Pulse: 63   Resp: 19   Temp: 98.6 F (37 C)   SpO2: 98% 98%   Vitals:   03/01/18 2125 03/02/18 0500 03/02/18 0603 03/02/18 0741  BP:   (!) 126/45   Pulse:   63   Resp:   19   Temp:   98.6 F (37 C)   TempSrc:   Oral   SpO2: 98%  98% 98%  Weight:  54.3 kg    Height:       General exam: Alert, awake, oriented x 3 Respiratory system: BBS clear. Respiratory effort normal. Cardiovascular system:normal s1,s2 sounds. No murmurs, rubs, gallops. Gastrointestinal system: Abdomen is nondistended, soft and nontender. No organomegaly or masses felt. Normal bowel sounds heard. Central nervous system: Alert and oriented. No focal neurological deficits. Extremities: no pretibial edema.  Skin: No rashes, lesions or ulcers Psychiatry:  Judgement and insight appear normal. Mood & affect appropriate.   The results of significant diagnostics from this hospitalization (including imaging, microbiology, ancillary and laboratory) are listed below for reference.     Microbiology: Recent Results (from the past 240 hour(s))  Culture, blood (Routine X 2) w Reflex to ID Panel     Status: None   Collection Time: 02/23/18  8:03 PM  Result Value Ref Range Status   Specimen Description LEFT ANTECUBITAL  Final   Special Requests   Final    BOTTLES DRAWN AEROBIC AND ANAEROBIC Blood Culture adequate volume   Culture   Final    NO GROWTH 5 DAYS Performed at [facility], [address] [place], [place] [address]    Report Status 02/28/2018 FINAL  Final  Culture, blood (Routine X 2) w Reflex to ID Panel     Status: None   Collection Time: 02/23/18  8:33 PM  Result Value Ref Range Status   Specimen Description BLOOD LEFT HAND  Final   Special Requests   Final    BOTTLES DRAWN AEROBIC AND ANAEROBIC Blood Culture adequate volume   Culture   Final    NO  GROWTH 5 DAYS Performed at [facility], [address], [place], [place] [address]    Report Status 02/28/2018 FINAL  Final  MRSA PCR Screening     Status: None   Collection Time: 02/24/18  6:22 AM  Result Value Ref Range Status   MRSA by PCR NEGATIVE NEGATIVE Final    Comment:        The GeneXpert MRSA Assay (FDA approved for NASAL specimens only), is one component of a comprehensive MRSA colonization surveillance program. It is not intended to diagnose MRSA infection nor to guide or monitor treatment for MRSA infections. Performed at [facility], [address] [place], [place] [address]   Culture, sputum-assessment     Status: None   Collection Time: 02/24/18  9:00 PM  Result Value Ref Range Status   Specimen Description EXPECTORATED SPUTUM  Final   Special Requests NONE  Final   Sputum evaluation   Final    Sputum specimen not acceptable for testing.  Please recollect.   NURSE NOTIFIED AT 0111 ON 02/25/2018 BY EVA Performed at [facility], [address] [place], [place] [address]    Report Status 02/25/2018 FINAL  Final     Labs: BNP (last 3 results) Recent Labs    02/23/18 2003  BNP 671.2*   Basic Metabolic Panel: Recent Labs  Lab 02/25/18 0825 02/26/18 0612 02/27/18 0530 02/28/18 0519 03/01/18 0640 03/02/18 0552  NA 138 139 140 138 138  --   K 4.0 3.4* 3.8 3.3* 3.0*  --   CL 110 108 105 102 104  --   CO2 21* 23 27 27 26   --   GLUCOSE 177* 107* 92 88 91  --   BUN 18 18 16 13 13   --   CREATININE 0.67 0.75 0.61 0.56 0.59 0.56  CALCIUM 8.4* 8.5* 8.3* 8.1* 7.7*  --   MG 2.1  --   --   --   --   --    Liver Function Tests: Recent Labs  Lab 02/23/18 2005  AST 40  ALT 36  ALKPHOS 79  BILITOT 1.1  PROT 7.4  ALBUMIN 3.5   No results for input(s): LIPASE, AMYLASE in the last 168 hours. No results for input(s): AMMONIA in the last 168 hours. CBC: Recent Labs  Lab 02/23/18 2005  02/24/18 0555  02/25/18 1626 02/26/18 0612 02/27/18 0530  02/28/18 0519 03/01/18 0640  WBC 20.1* 13.3*   < > 21.9* 24.4* 17.7* 17.9* 21.3*  NEUTROABS 17.3* 12.7*  --   --   --   --   --   --   HGB 11.3* 8.7*   < > 7.9* 8.3* 8.6* 8.5* 8.6*  HCT 38.9 29.4*   < > 27.3* 29.1* 29.1* 29.3* 29.3*  MCV 70.9* 71.7*   < > 71.7* 72.6* 72.2* 70.4* 70.6*  PLT 394 317   < > 362 412* 441* 453* 475*   < > = values in this interval not displayed.   Cardiac Enzymes: Recent Labs  Lab 02/23/18 2005 02/24/18 0006 02/24/18 0555 02/24/18 1627  TROPONINI 0.16* 0.10* 0.10* 0.04*   BNP: Invalid input(s): POCBNP CBG: Recent Labs  Lab 03/01/18 0746 03/01/18 1620 03/01/18 2129 03/02/18 0754 03/02/18 1126  GLUCAP 88 131* 98 78 87   D-Dimer No results for input(s): DDIMER in the last 72 hours. Hgb A1c No results for input(s): HGBA1C in the last 72 hours. Lipid Profile No results for input(s): CHOL, HDL, LDLCALC, TRIG, CHOLHDL, LDLDIRECT in the last 72 hours. Thyroid function studies No results for input(s): TSH, T4TOTAL, T3FREE, THYROIDAB in the last 72 hours.  Invalid input(s): FREET3 Anemia work up No results for input(s): VITAMINB12, FOLATE, FERRITIN, TIBC, IRON, RETICCTPCT in the last 72 hours. Urinalysis    Component Value Date/Time   COLORURINE YELLOW 02/23/2018 2041   APPEARANCEUR CLEAR 02/23/2018 2041   APPEARANCEUR Cloudy (A) 12/26/2017 1735   LABSPEC 1.013 02/23/2018 2041   PHURINE 5.0 02/23/2018 2041   GLUCOSEU NEGATIVE 02/23/2018 2041   HGBUR MODERATE (A) 02/23/2018 2041   BILIRUBINUR NEGATIVE 02/23/2018 2041   BILIRUBINUR Negative 12/26/2017 1735   [place] 02/23/2018 2041   PROTEINUR NEGATIVE 02/23/2018 2041   UROBILINOGEN negative 07/31/2013 1123   NITRITE NEGATIVE 02/23/2018 2041   LEUKOCYTESUR NEGATIVE 02/23/2018 2041   Sepsis Labs Invalid input(s): PROCALCITONIN,  WBC,  LACTICIDVEN Microbiology Recent Results (from the past 240 hour(s))  Culture, blood (Routine X 2) w Reflex to ID Panel     Status: None    Collection Time: 02/23/18  8:03 PM  Result Value Ref Range Status   Specimen Description LEFT ANTECUBITAL  Final   Special Requests   Final    BOTTLES DRAWN AEROBIC AND ANAEROBIC Blood Culture adequate volume   Culture   Final    NO GROWTH 5 DAYS Performed at [facility], [address], [place], [place] [address]    Report Status 02/28/2018 FINAL  Final  Culture, blood (Routine X 2) w Reflex to ID Panel     Status: None   Collection Time: 02/23/18  8:33 PM  Result Value Ref Range Status   Specimen Description BLOOD LEFT HAND  Final   Special Requests   Final    BOTTLES DRAWN AEROBIC AND ANAEROBIC Blood Culture adequate volume   Culture   Final    NO GROWTH 5 DAYS Performed at [facility], [address], [place], [place] [address]    Report Status 02/28/2018 FINAL  Final  MRSA PCR Screening     Status: None   Collection Time: 02/24/18  6:22 AM  Result Value Ref Range Status   MRSA by PCR NEGATIVE NEGATIVE Final    Comment:        The GeneXpert MRSA Assay (FDA approved for NASAL specimens only), is one component of a comprehensive MRSA colonization surveillance  program. It is not intended to diagnose MRSA infection nor to guide or monitor treatment for MRSA infections. Performed at [facility], [address] [place], [place] [address]   Culture, sputum-assessment     Status: None   Collection Time: 02/24/18  9:00 PM  Result Value Ref Range Status   Specimen Description EXPECTORATED SPUTUM  Final   Special Requests NONE  Final   Sputum evaluation   Final    Sputum specimen not acceptable for testing.  Please recollect.   NURSE NOTIFIED AT 0111 ON 02/25/2018 BY EVA Performed at [facility], [address], [place], [place] [address]    Report Status 02/25/2018 FINAL  Final   Time coordinating discharge: 35 mins   SIGNED:  Irwin Brakeman, MD  [facility] 03/02/2018, 11:40 AM How to contact the [facility] Attending or Consulting provider [place] or covering  provider during after hours [place], for this patient?  1. Check the care team in [facility] and look for a) attending/consulting [facility] provider listed and b) the [facility] team listed 2. Log into [email or web address] and use [place] universal password to access. If you do not have the password, please contact the hospital operator. 3. Locate the [facility] provider you are looking for under [facility] and page to a number that you can be directly reached. 4. If you still have difficulty reaching the provider, please page the [facility] (Director on Call) for the Hospitalists listed on amion for assistance.

## 2018-03-02 NOTE — Progress Notes (Signed)
PT Cancellation Note  Patient Details Name: SHANEY DECKMAN MRN: [id number] DOB: 06-19-1932   Cancelled Treatment:    Reason Eval/Treat Not Completed: Patient declined, no reason specified  Attempted PT session this morning.  Pt refused to participate, no reason given.  Will attempted again later if available.  Pt left in bed with call bell within reach.   [address] LPTA; [place]  Aldona Lento 03/02/2018, 9:32 AM

## 2018-03-02 NOTE — Progress Notes (Signed)
Nsg Discharge Note  Admit Date:  02/23/2018 Discharge date: 03/02/2018   [name] to be D/C'd Skilled nursing facility per MD order.  AVS completed.  Copy for chart, and copy for patient signed, and dated. Patient/caregiver able to verbalize understanding.  Discharge Medication: Allergies as of 03/02/2018      Reactions   Codeine Anaphylaxis   Penicillins Anaphylaxis   Sulfa Antibiotics Anaphylaxis      Medication List    STOP taking these medications   azithromycin 250 MG tablet Commonly known as:  ZITHROMAX Z-PAK   ciprofloxacin 250 MG tablet Commonly known as:  CIPRO   clotrimazole 1 % cream Commonly known as:  LOTRIMIN   CRANBERRY PO   nitrofurantoin (macrocrystal-monohydrate) 100 MG capsule Commonly known as:  MACROBID   triamcinolone 0.1 % cream : eucerin Crea   Vitamin D 50 MCG (2000 UT) tablet     TAKE these medications   acetaminophen 325 MG tablet Commonly known as:  TYLENOL Take 2 tablets (650 mg total) by mouth every 6 (six) hours as needed for mild pain (or Fever >/= 101).   albuterol 108 (90 Base) MCG/ACT inhaler Commonly known as:  PROVENTIL HFA;VENTOLIN HFA Inhale 2 puffs into the lungs every 6 (six) hours as needed for wheezing or shortness of breath.   ALPRAZolam 0.5 MG tablet Commonly known as:  XANAX Take 1 tablet (0.5 mg total) by mouth 2 (two) times daily as needed for anxiety. TAKE (1) TABLET TWICE A DAY. What changed:    how much to take  how to take this  when to take this  reasons to take this   aspirin EC 81 MG tablet Take 1 tablet (81 mg total) by mouth daily. What changed:    medication strength  how much to take  when to take this   budesonide-formoterol 160-4.5 MCG/ACT inhaler Commonly known as:  SYMBICORT Inhale 2 puffs into the lungs 2 (two) times daily.   donepezil 5 MG tablet Commonly known as:  ARICEPT Take 1 tablet (5 mg total) by mouth at bedtime.   doxycycline 100 MG capsule Commonly known as:   VIBRAMYCIN Take 1 capsule (100 mg total) by mouth 2 (two) times daily for 3 days. Start taking on:  March 03, 4008   folic acid 1 MG tablet Commonly known as:  FOLVITE Take 1 tablet (1 mg total) by mouth daily. Start taking on:  March 03, 2018   furosemide 20 MG tablet Commonly known as:  LASIX Take 1 tablet (20 mg total) by mouth every other day. Start taking on:  March 05, 2018 What changed:    when to take this  These instructions start on March 05, 2018. If you are unsure what to do until then, ask your doctor or other care provider.   guaiFENesin 600 MG 12 hr tablet Commonly known as:  MUCINEX Take 1 tablet (600 mg total) by mouth 2 (two) times daily for 5 days.   oxybutynin 5 MG tablet Commonly known as:  DITROPAN Take 1 tablet (5 mg total) by mouth 4 (four) times daily.   pantoprazole 40 MG tablet Commonly known as:  PROTONIX Take 1 tablet (40 mg total) by mouth daily.   potassium chloride 10 MEQ tablet Commonly known as:  K-DUR,KLOR-CON Take 1 tablet (10 mEq total) by mouth every other day. As a potassium supplement Start taking on:  March 05, 2018 What changed:    when to take this  These instructions start on March 05, 2018. If you are unsure what to do until then, ask your doctor or other care provider.   predniSONE 20 MG tablet Commonly known as:  DELTASONE Take 1 tablet (20 mg total) by mouth daily with breakfast for 5 days. Start taking on:  March 03, 2018       Discharge Assessment: Vitals:   03/02/18 0603 03/02/18 0741  BP: (!) 126/45   Pulse: 63   Resp: 19   Temp: 98.6 F (37 C)   SpO2: 98% 98%   Skin clean, dry and intact without evidence of skin break down, no evidence of skin tears noted. IV catheter discontinued intact. Site without signs and symptoms of complications - no redness or edema noted at insertion site, patient denies c/o pain - only slight tenderness at site.  Dressing with slight pressure applied.  D/c  Instructions-Education: Discharge instructions given to patient/family with verbalized understanding. D/c education completed with patient/family including follow up instructions, medication list, d/c activities limitations if indicated, with other d/c instructions as indicated by MD - patient able to verbalize understanding, all questions fully answered. Patient instructed to return to ED, call 911, or call MD for any changes in condition.  Patient escorted via [place], and D/C home via private auto.  Eda Keys, RN 03/02/2018 1:55 PM

## 2018-03-07 DIAGNOSIS — F0390 Unspecified dementia without behavioral disturbance: Secondary | ICD-10-CM | POA: Diagnosis not present

## 2018-03-07 DIAGNOSIS — I501 Left ventricular failure: Secondary | ICD-10-CM | POA: Diagnosis not present

## 2018-03-07 DIAGNOSIS — J189 Pneumonia, unspecified organism: Secondary | ICD-10-CM | POA: Diagnosis not present

## 2018-03-07 DIAGNOSIS — J449 Chronic obstructive pulmonary disease, unspecified: Secondary | ICD-10-CM | POA: Diagnosis not present

## 2018-03-09 ENCOUNTER — Ambulatory Visit: Payer: Medicare Other | Admitting: Family Medicine

## 2018-03-12 DIAGNOSIS — J9621 Acute and chronic respiratory failure with hypoxia: Secondary | ICD-10-CM | POA: Diagnosis not present

## 2018-03-12 DIAGNOSIS — J449 Chronic obstructive pulmonary disease, unspecified: Secondary | ICD-10-CM | POA: Diagnosis not present

## 2018-03-12 DIAGNOSIS — C3490 Malignant neoplasm of unspecified part of unspecified bronchus or lung: Secondary | ICD-10-CM | POA: Diagnosis not present

## 2018-03-12 DIAGNOSIS — I501 Left ventricular failure: Secondary | ICD-10-CM | POA: Diagnosis not present

## 2018-03-15 DIAGNOSIS — R1310 Dysphagia, unspecified: Secondary | ICD-10-CM | POA: Diagnosis not present

## 2018-03-15 DIAGNOSIS — C3490 Malignant neoplasm of unspecified part of unspecified bronchus or lung: Secondary | ICD-10-CM | POA: Diagnosis not present

## 2018-03-15 DIAGNOSIS — F0390 Unspecified dementia without behavioral disturbance: Secondary | ICD-10-CM | POA: Diagnosis not present

## 2018-03-15 DIAGNOSIS — R630 Anorexia: Secondary | ICD-10-CM | POA: Diagnosis not present

## 2018-04-01 DIAGNOSIS — R627 Adult failure to thrive: Secondary | ICD-10-CM | POA: Diagnosis not present

## 2018-04-01 DIAGNOSIS — F0390 Unspecified dementia without behavioral disturbance: Secondary | ICD-10-CM | POA: Diagnosis not present

## 2018-04-01 DIAGNOSIS — R630 Anorexia: Secondary | ICD-10-CM | POA: Diagnosis not present

## 2018-04-01 DIAGNOSIS — C3490 Malignant neoplasm of unspecified part of unspecified bronchus or lung: Secondary | ICD-10-CM | POA: Diagnosis not present

## 2018-04-04 DIAGNOSIS — R630 Anorexia: Secondary | ICD-10-CM | POA: Diagnosis not present

## 2018-04-04 DIAGNOSIS — R627 Adult failure to thrive: Secondary | ICD-10-CM | POA: Diagnosis not present

## 2018-04-04 DIAGNOSIS — J189 Pneumonia, unspecified organism: Secondary | ICD-10-CM | POA: Diagnosis not present

## 2018-04-04 DIAGNOSIS — J449 Chronic obstructive pulmonary disease, unspecified: Secondary | ICD-10-CM | POA: Diagnosis not present

## 2018-04-12 ENCOUNTER — Inpatient Hospital Stay (HOSPITAL_COMMUNITY): Payer: Medicare Other

## 2018-04-12 ENCOUNTER — Other Ambulatory Visit: Payer: Self-pay

## 2018-04-12 ENCOUNTER — Emergency Department (HOSPITAL_COMMUNITY): Payer: Medicare Other

## 2018-04-12 ENCOUNTER — Inpatient Hospital Stay (HOSPITAL_COMMUNITY)
Admission: EM | Admit: 2018-04-12 | Discharge: 2018-05-11 | DRG: 871 | Disposition: E | Payer: Medicare Other | Source: Skilled Nursing Facility | Attending: Pulmonary Disease | Admitting: Pulmonary Disease

## 2018-04-12 ENCOUNTER — Encounter (HOSPITAL_COMMUNITY): Payer: Self-pay

## 2018-04-12 DIAGNOSIS — R092 Respiratory arrest: Secondary | ICD-10-CM | POA: Diagnosis not present

## 2018-04-12 DIAGNOSIS — R0902 Hypoxemia: Secondary | ICD-10-CM | POA: Diagnosis not present

## 2018-04-12 DIAGNOSIS — J189 Pneumonia, unspecified organism: Secondary | ICD-10-CM | POA: Diagnosis not present

## 2018-04-12 DIAGNOSIS — Z20828 Contact with and (suspected) exposure to other viral communicable diseases: Secondary | ICD-10-CM | POA: Diagnosis present

## 2018-04-12 DIAGNOSIS — Z7951 Long term (current) use of inhaled steroids: Secondary | ICD-10-CM | POA: Diagnosis not present

## 2018-04-12 DIAGNOSIS — N170 Acute kidney failure with tubular necrosis: Secondary | ICD-10-CM | POA: Diagnosis present

## 2018-04-12 DIAGNOSIS — Z85038 Personal history of other malignant neoplasm of large intestine: Secondary | ICD-10-CM

## 2018-04-12 DIAGNOSIS — Z8249 Family history of ischemic heart disease and other diseases of the circulatory system: Secondary | ICD-10-CM

## 2018-04-12 DIAGNOSIS — J9811 Atelectasis: Secondary | ICD-10-CM | POA: Diagnosis not present

## 2018-04-12 DIAGNOSIS — R4020 Unspecified coma: Secondary | ICD-10-CM | POA: Diagnosis not present

## 2018-04-12 DIAGNOSIS — Z79899 Other long term (current) drug therapy: Secondary | ICD-10-CM | POA: Diagnosis not present

## 2018-04-12 DIAGNOSIS — I509 Heart failure, unspecified: Secondary | ICD-10-CM | POA: Diagnosis present

## 2018-04-12 DIAGNOSIS — K219 Gastro-esophageal reflux disease without esophagitis: Secondary | ICD-10-CM | POA: Diagnosis present

## 2018-04-12 DIAGNOSIS — J9621 Acute and chronic respiratory failure with hypoxia: Secondary | ICD-10-CM | POA: Diagnosis present

## 2018-04-12 DIAGNOSIS — Z801 Family history of malignant neoplasm of trachea, bronchus and lung: Secondary | ICD-10-CM

## 2018-04-12 DIAGNOSIS — Z4659 Encounter for fitting and adjustment of other gastrointestinal appliance and device: Secondary | ICD-10-CM | POA: Diagnosis not present

## 2018-04-12 DIAGNOSIS — A419 Sepsis, unspecified organism: Secondary | ICD-10-CM | POA: Diagnosis present

## 2018-04-12 DIAGNOSIS — N390 Urinary tract infection, site not specified: Secondary | ICD-10-CM | POA: Diagnosis present

## 2018-04-12 DIAGNOSIS — Z515 Encounter for palliative care: Secondary | ICD-10-CM | POA: Diagnosis present

## 2018-04-12 DIAGNOSIS — N179 Acute kidney failure, unspecified: Secondary | ICD-10-CM | POA: Diagnosis not present

## 2018-04-12 DIAGNOSIS — J449 Chronic obstructive pulmonary disease, unspecified: Secondary | ICD-10-CM | POA: Diagnosis present

## 2018-04-12 DIAGNOSIS — Z86711 Personal history of pulmonary embolism: Secondary | ICD-10-CM | POA: Diagnosis not present

## 2018-04-12 DIAGNOSIS — J9600 Acute respiratory failure, unspecified whether with hypoxia or hypercapnia: Secondary | ICD-10-CM | POA: Diagnosis not present

## 2018-04-12 DIAGNOSIS — Z8701 Personal history of pneumonia (recurrent): Secondary | ICD-10-CM | POA: Diagnosis not present

## 2018-04-12 DIAGNOSIS — Z66 Do not resuscitate: Secondary | ICD-10-CM | POA: Diagnosis present

## 2018-04-12 DIAGNOSIS — B9689 Other specified bacterial agents as the cause of diseases classified elsewhere: Secondary | ICD-10-CM | POA: Diagnosis present

## 2018-04-12 DIAGNOSIS — Z8711 Personal history of peptic ulcer disease: Secondary | ICD-10-CM

## 2018-04-12 DIAGNOSIS — Z7982 Long term (current) use of aspirin: Secondary | ICD-10-CM

## 2018-04-12 DIAGNOSIS — R404 Transient alteration of awareness: Secondary | ICD-10-CM | POA: Diagnosis not present

## 2018-04-12 DIAGNOSIS — J439 Emphysema, unspecified: Secondary | ICD-10-CM | POA: Diagnosis not present

## 2018-04-12 DIAGNOSIS — E872 Acidosis: Secondary | ICD-10-CM | POA: Diagnosis present

## 2018-04-12 DIAGNOSIS — J9602 Acute respiratory failure with hypercapnia: Secondary | ICD-10-CM | POA: Diagnosis not present

## 2018-04-12 DIAGNOSIS — J9601 Acute respiratory failure with hypoxia: Secondary | ICD-10-CM | POA: Diagnosis not present

## 2018-04-12 DIAGNOSIS — J9690 Respiratory failure, unspecified, unspecified whether with hypoxia or hypercapnia: Secondary | ICD-10-CM | POA: Diagnosis not present

## 2018-04-12 DIAGNOSIS — R52 Pain, unspecified: Secondary | ICD-10-CM | POA: Diagnosis not present

## 2018-04-12 DIAGNOSIS — F17210 Nicotine dependence, cigarettes, uncomplicated: Secondary | ICD-10-CM | POA: Diagnosis present

## 2018-04-12 DIAGNOSIS — E785 Hyperlipidemia, unspecified: Secondary | ICD-10-CM | POA: Diagnosis present

## 2018-04-12 DIAGNOSIS — R531 Weakness: Secondary | ICD-10-CM | POA: Diagnosis not present

## 2018-04-12 DIAGNOSIS — R0602 Shortness of breath: Secondary | ICD-10-CM | POA: Diagnosis not present

## 2018-04-12 DIAGNOSIS — R6521 Severe sepsis with septic shock: Secondary | ICD-10-CM | POA: Diagnosis present

## 2018-04-12 DIAGNOSIS — R6520 Severe sepsis without septic shock: Secondary | ICD-10-CM | POA: Diagnosis not present

## 2018-04-12 DIAGNOSIS — Z930 Tracheostomy status: Secondary | ICD-10-CM | POA: Diagnosis not present

## 2018-04-12 LAB — BLOOD GAS, ARTERIAL
Acid-base deficit: 11.7 mmol/L — ABNORMAL HIGH (ref 0.0–2.0)
Bicarbonate: 15.3 mmol/L — ABNORMAL LOW (ref 20.0–28.0)
FIO2: 100
O2 Saturation: 99.3 %
Patient temperature: 34.1
pCO2 arterial: 29.8 mmHg — ABNORMAL LOW (ref 32.0–48.0)
pH, Arterial: 7.283 — ABNORMAL LOW (ref 7.350–7.450)
pO2, Arterial: 437 mmHg — ABNORMAL HIGH (ref 83.0–108.0)

## 2018-04-12 LAB — CBC WITH DIFFERENTIAL/PLATELET
Abs Immature Granulocytes: 1.24 10*3/uL — ABNORMAL HIGH (ref 0.00–0.07)
Basophils Absolute: 0 10*3/uL (ref 0.0–0.1)
Basophils Relative: 0 %
Eosinophils Absolute: 0.1 10*3/uL (ref 0.0–0.5)
Eosinophils Relative: 0 %
HCT: 41.3 % (ref 36.0–46.0)
Hemoglobin: 12.5 g/dL (ref 12.0–15.0)
Immature Granulocytes: 3 %
Lymphocytes Relative: 3 %
Lymphs Abs: 1.3 10*3/uL (ref 0.7–4.0)
MCH: 24 pg — ABNORMAL LOW (ref 26.0–34.0)
MCHC: 30.3 g/dL (ref 30.0–36.0)
MCV: 79.4 fL — ABNORMAL LOW (ref 80.0–100.0)
Monocytes Absolute: 1.5 10*3/uL — ABNORMAL HIGH (ref 0.1–1.0)
Monocytes Relative: 4 %
Neutro Abs: 34.8 10*3/uL — ABNORMAL HIGH (ref 1.7–7.7)
Neutrophils Relative %: 90 %
Platelets: 266 10*3/uL (ref 150–400)
RBC: 5.2 MIL/uL — ABNORMAL HIGH (ref 3.87–5.11)
RDW: 23.9 % — ABNORMAL HIGH (ref 11.5–15.5)
WBC: 38.9 10*3/uL — ABNORMAL HIGH (ref 4.0–10.5)
nRBC: 0.3 % — ABNORMAL HIGH (ref 0.0–0.2)

## 2018-04-12 LAB — COMPREHENSIVE METABOLIC PANEL
ALT: 142 U/L — ABNORMAL HIGH (ref 0–44)
AST: 230 U/L — ABNORMAL HIGH (ref 15–41)
Albumin: 2.3 g/dL — ABNORMAL LOW (ref 3.5–5.0)
Alkaline Phosphatase: 70 U/L (ref 38–126)
Anion gap: >20 — ABNORMAL HIGH (ref 5–15)
BUN: 97 mg/dL — ABNORMAL HIGH (ref 8–23)
CO2: 13 mmol/L — ABNORMAL LOW (ref 22–32)
Calcium: 8 mg/dL — ABNORMAL LOW (ref 8.9–10.3)
Chloride: 95 mmol/L — ABNORMAL LOW (ref 98–111)
Creatinine, Ser: 1.75 mg/dL — ABNORMAL HIGH (ref 0.44–1.00)
GFR calc Af Amer: 30 mL/min — ABNORMAL LOW (ref >60)
GFR calc non Af Amer: 26 mL/min — ABNORMAL LOW (ref >60)
Glucose, Bld: 121 mg/dL — ABNORMAL HIGH (ref 70–99)
Potassium: 4.6 mmol/L (ref 3.5–5.1)
Sodium: 131 mmol/L — ABNORMAL LOW (ref 135–145)
Total Bilirubin: 1.2 mg/dL (ref 0.3–1.2)
Total Protein: 5 g/dL — ABNORMAL LOW (ref 6.5–8.1)

## 2018-04-12 LAB — BRAIN NATRIURETIC PEPTIDE: B Natriuretic Peptide: 149 pg/mL — ABNORMAL HIGH (ref 0.0–100.0)

## 2018-04-12 LAB — GLUCOSE, CAPILLARY: Glucose-Capillary: 85 mg/dL (ref 70–99)

## 2018-04-12 LAB — URINALYSIS, ROUTINE W REFLEX MICROSCOPIC
Bilirubin Urine: NEGATIVE
Glucose, UA: NEGATIVE mg/dL
Ketones, ur: NEGATIVE mg/dL
Leukocytes,Ua: NEGATIVE
Nitrite: NEGATIVE
Protein, ur: NEGATIVE mg/dL
Specific Gravity, Urine: 1.013 (ref 1.005–1.030)
pH: 5 (ref 5.0–8.0)

## 2018-04-12 LAB — LACTIC ACID, PLASMA
Lactic Acid, Venous: 9.5 mmol/L (ref 0.5–1.9)
Lactic Acid, Venous: 7.8 mmol/L (ref 0.5–1.9)

## 2018-04-12 MED ORDER — MIDAZOLAM HCL 2 MG/2ML IJ SOLN
2.0000 mg | Freq: Once | INTRAMUSCULAR | Status: AC
  Administered 2018-04-12: 2 mg via INTRAVENOUS
  Filled 2018-04-12: qty 2

## 2018-04-12 MED ORDER — PANTOPRAZOLE SODIUM 40 MG IV SOLR: 40.0000 mg | Freq: Once | INTRAVENOUS | Status: DC

## 2018-04-12 MED ORDER — FENTANYL CITRATE (PF) 100 MCG/2ML IJ SOLN
50.0000 ug | INTRAMUSCULAR | Status: DC | PRN
  Administered 2018-04-12: 50 ug via INTRAVENOUS
  Filled 2018-04-12: qty 2

## 2018-04-12 MED ORDER — VANCOMYCIN VARIABLE DOSE PER UNSTABLE RENAL FUNCTION (PHARMACIST DOSING): Status: DC

## 2018-04-12 MED ORDER — METHYLPREDNISOLONE SODIUM SUCC 125 MG IJ SOLR
125.0000 mg | Freq: Once | INTRAMUSCULAR | Status: AC
  Administered 2018-04-12: 125 mg via INTRAVENOUS
  Filled 2018-04-12: qty 2

## 2018-04-12 MED ORDER — SODIUM CHLORIDE 0.9 % IV SOLN
2.0000 g | Freq: Once | INTRAVENOUS | Status: AC
  Administered 2018-04-12: 2 g via INTRAVENOUS
  Filled 2018-04-12: qty 2

## 2018-04-12 MED ORDER — VANCOMYCIN HCL IN DEXTROSE 1-5 GM/200ML-% IV SOLN
1000.0000 mg | Freq: Once | INTRAVENOUS | Status: AC
  Administered 2018-04-12: 1000 mg via INTRAVENOUS
  Filled 2018-04-12: qty 200

## 2018-04-12 MED ORDER — ORAL CARE MOUTH RINSE: 15.0000 mL | OROMUCOSAL | Status: DC

## 2018-04-12 MED ORDER — LACTATED RINGERS IV BOLUS: 1000.0000 mL | Freq: Once | INTRAVENOUS | Status: DC

## 2018-04-12 MED ORDER — PROPOFOL 1000 MG/100ML IV EMUL
5.0000 ug/kg/min | INTRAVENOUS | Status: DC
  Administered 2018-04-12: 5 ug/kg/min via INTRAVENOUS
  Filled 2018-04-12: qty 100

## 2018-04-12 MED ORDER — MORPHINE 100MG IN NS 100ML (1MG/ML) PREMIX INFUSION
10.0000 mg/h | INTRAVENOUS | Status: DC
  Filled 2018-04-12: qty 100

## 2018-04-12 MED ORDER — SODIUM CHLORIDE 0.9 % IV BOLUS
2000.0000 mL | Freq: Once | INTRAVENOUS | Status: AC
  Administered 2018-04-12: 2000 mL via INTRAVENOUS

## 2018-04-12 MED ORDER — PANTOPRAZOLE SODIUM 40 MG IV SOLR
40.0000 mg | Freq: Two times a day (BID) | INTRAVENOUS | Status: DC
  Filled 2018-04-12: qty 40

## 2018-04-12 MED ORDER — CHLORHEXIDINE GLUCONATE 0.12% ORAL RINSE (MEDLINE KIT): 15.0000 mL | Freq: Two times a day (BID) | OROMUCOSAL | Status: DC

## 2018-04-12 MED ORDER — MIDAZOLAM HCL 2 MG/2ML IJ SOLN
1.0000 mg | INTRAMUSCULAR | Status: DC | PRN
  Filled 2018-04-12: qty 2

## 2018-04-12 MED ORDER — MIDAZOLAM HCL 2 MG/2ML IJ SOLN
2.0000 mg | Freq: Once | INTRAMUSCULAR | Status: AC
  Administered 2018-04-12: 2 mg via INTRAVENOUS

## 2018-04-12 MED ORDER — SODIUM CHLORIDE 0.9 % IV SOLN
1.0000 g | Freq: Three times a day (TID) | INTRAVENOUS | Status: DC
  Filled 2018-04-12 (×2): qty 1

## 2018-04-12 MED ORDER — PANTOPRAZOLE SODIUM 40 MG IV SOLR: 40.0000 mg | Freq: Every day | INTRAVENOUS | Status: DC

## 2018-04-12 MED ORDER — MORPHINE SULFATE (PF) 4 MG/ML IV SOLN
INTRAVENOUS | Status: AC
  Filled 2018-04-12: qty 1

## 2018-04-12 MED ORDER — NOREPINEPHRINE 4 MG/250ML-% IV SOLN
0.0000 ug/min | INTRAVENOUS | Status: DC
  Administered 2018-04-12: 2 ug/min via INTRAVENOUS
  Filled 2018-04-12: qty 250

## 2018-04-12 MED ORDER — MORPHINE SULFATE (PF) 4 MG/ML IV SOLN
4.0000 mg | Freq: Once | INTRAVENOUS | Status: AC
  Administered 2018-04-12: 4 mg via INTRAVENOUS

## 2018-04-12 NOTE — ED Notes (Signed)
Attempting to get another IV on pt.

## 2018-04-12 NOTE — Procedures (Signed)
Extubation Procedure Note  Patient Details:   Name: [name] DOB: 1932/06/11 MRN: [id number]  Patient terminally extubated to RA per Md order.   Evaluation  O2 sats: currently acceptable Complications: No apparent complications Patient did not tolerate procedure well. Bilateral Breath Sounds: Diminished   No  Kelle Darting 05/01/2018, 2342

## 2018-04-12 NOTE — ED Notes (Signed)
[facility] at bedside 

## 2018-04-12 NOTE — ED Notes (Signed)
Date and time results received: 05/02/2018 1913 (use smartphrase ".now" to insert current time)  Test:Lactic Acid Critical Value: 7.8 Name of Provider Notified:Dr Zammit Orders Received? Or Actions Taken?:NA

## 2018-04-12 NOTE — ED Notes (Signed)
Report given to [facility] with [facility].  Will ask primary RN to update report when she is available.

## 2018-04-12 NOTE — ED Triage Notes (Signed)
Pt brought to ED by [place] from [facility] Per [facility], pt was unconscious on arrival to nursing facility, pt intubated by EMS, pt being treated for pneumonia and just finished abx, pt normally alert and oriented but has been in the bed since being treated for pneumonia. Pt bp 78/50, after fluid bolus of 700 ml bp 154/70 per EMS, glucose 156, full code, O2 sats high 60's at nursing facility. Pt with spontaneous eye opening at this time and reaching for the ET tube.

## 2018-04-12 NOTE — ED Notes (Signed)
Called [facility] with bed assignment and for transport to [facility].

## 2018-04-12 NOTE — H&P (Addendum)
NAME:  [name], MRN:  [id number], DOB:  January 26, 1932, LOS: 0 ADMISSION DATE:  04/29/2018, CONSULTATION DATE:  04/11/2018 REFERRING MD:  Dr. Roderic Palau, CHIEF COMPLAINT:  Respiratory failure  Brief History   32 yoF transfer from [facility] ED.  SNF patient with one week history of SOB and being treated for pneumonia.  Found by EMS   History of present illness   HPI obtained from medical chart review as patient is intubated and unable to provide.   85 year old presenting to [facility] ED from [facility] after being found unresponsive, hypoxic in the 60's, hypotensive, and unable to protect airway.  Patient recently treated for pneumonia and finished antibiotics.  Intubated by EMS. Baseline is normally alert and oriented.    In ER, labs noted for lactic 9.5, Na 131, glucose 121, BUN 97, sCr 1.75 (prior 0.56 03/02/2018), transaminitis, AG >20, WBC 38.9, neg UA, BNP 149, CXR not showing any acute process.  Vitals noted for initial hypothermia of 92.7 which then spiked to 101.1, tachycardic and ongoing hypotensive requiring pressor support with levophed.  She was empirically started on vancomycin and aztreonam. Patient to be transferred to [facility] for further care.    Past Medical History  COPD, depression, colon cancer, HLD, GERD, IBS, PUD, PE not on AC, pneumonia  Significant Hospital Events   4/2 Admit  Consults:   Procedures:  4/2 ETT >>  Significant Diagnostic Tests:   Micro Data:  4/2 BC x 2 >> 4/2 UC >>  4/2 MRSA PCR >> 4/2 RVP >>  Antimicrobials:  4/2 vanc >> 4/2 aztreonam >>  Interim history/subjective:  Arrived on levophed peripherally at 8 mcg needing increased to 12 mcg due to ongoing hypotension  Objective   Blood pressure (!) 89/60, pulse (!) 113, temperature (!) 100.4 F (38 C), resp. rate (!) 33, height 5\' 4"  (1.626 m), weight 49.9 kg, SpO2 (!) 72 %.    Vent Mode: PRVC FiO2 (%):  [100 %] 100 % Set Rate:  [24 bmp-38 bmp] 38 bmp Vt Set:  [320 mL-334 mL] 334 mL  PEEP:  [5 cmH20] 5 cmH20 Plateau Pressure:  [20 cmH20] 20 cmH20   Intake/Output Summary (Last 24 hours) at 04/14/2018 2302 Last data filed at 04/17/2018 2016 Gross per 24 hour  Intake 4307.73 ml  Output -  Net 4307.73 ml   Filed Weights   05/05/2018 1548  Weight: 49.9 kg    Examination: General:  Critically ill appearing elderly female in resp distress on MV HEENT: ETT Neuro: unrespnsive CV: ST, hypotensive SBP 50's on levophed at 12 mcg/min PULM: tachypneic on MV with accessory muscle use Extremities: no edema   Resolved Hospital Problem list    Assessment & Plan:  Acute hypoxic respiratory failure Shock- presumed septic AKI w/ oliguria  Hypernatermia Hx COPD - CXR with no acute process  P:  On arrival, patient in severe distress on MV and hypotensive with SBP in the 50's despite pressors.  She is febrile with leukocytosis of 38K, with unclear source.  Given history, unclear sepsis, fever, hypoxia, leukocytosis, RVP needs to be sent stat and if negative, add on COVID-19 panel.   Of note, patient was not on prior isolation prior to arrival at [facility].  Place on airborne/ contact precautions, currently in negative pressure room Dr. Claudie Leach spoke with patient's family by phone who after thoughtful and careful considerations, have decided to go full comfort care.   She is now a DNR/ DNI Family has chosen  to visit by Elink camera. Comfort measures placed- morphine/ versed, will d/c pressor and precede with terminal extubation.    Best practice:  Diet: NPO Pain/Anxiety/Delirium protocol (if indicated): comfort measures, morphine/ versed VAP protocol (if indicated): yes DVT prophylaxis: n/a GI prophylaxis: n/a Glucose control: n/a Mobility: BR Code Status: DNR/ DNI Family Communication: updated by Dr. Claudie Leach Disposition: ICU  Labs   CBC: Recent Labs  Lab 04/27/2018 1555  WBC 38.9*  NEUTROABS 34.8*  HGB 12.5  HCT 41.3  MCV 79.4*  PLT 366    Basic Metabolic Panel:  Recent Labs  Lab 04/24/2018 1555  NA 131*  K 4.6  CL 95*  CO2 13*  GLUCOSE 121*  BUN 97*  CREATININE 1.75*  CALCIUM 8.0*   GFR: Estimated Creatinine Clearance: 18.9 mL/min (A) (by C-G formula based on SCr of 1.75 mg/dL (H)). Recent Labs  Lab 04/17/2018 1555 04/24/2018 1558 04/22/2018 1825  WBC 38.9*  --   --   LATICACIDVEN  --  9.5* 7.8*    Liver Function Tests: Recent Labs  Lab 04/26/2018 1555  AST 230*  ALT 142*  ALKPHOS 70  BILITOT 1.2  PROT 5.0*  ALBUMIN 2.3*   No results for input(s): LIPASE, AMYLASE in the last 168 hours. No results for input(s): AMMONIA in the last 168 hours.  ABG    Component Value Date/Time   PHART 7.283 (L) 04/23/2018 1606   PCO2ART 29.8 (L) 04/19/2018 1606   PO2ART 437 (H) 05/08/2018 1606   HCO3 15.3 (L) 04/29/2018 1606   TCO2 29 08/10/2012 0717   ACIDBASEDEF 11.7 (H) 05/04/2018 1606   O2SAT 99.3 05/01/2018 1606     Coagulation Profile: No results for input(s): INR, PROTIME in the last 168 hours.  Cardiac Enzymes: No results for input(s): CKTOTAL, CKMB, CKMBINDEX, TROPONINI in the last 168 hours.  HbA1C: No results found for: HGBA1C  CBG: Recent Labs  Lab 04/28/2018 2216  GLUCAP 85    Review of Systems:   Unable   Past Medical History  She,  has a past medical history of Anxiety disorder, Cancer ([place]), Colon adenoma, COPD (chronic obstructive pulmonary disease) ([place]), Depression, Fall at home (May 17, 2012), GERD (gastroesophageal reflux disease), Hyperlipidemia, Irritable bowel syndrome, Lymphocytic colitis (12/15/2011), Peptic ulcer disease, Pneumonia (2007), Pseudotumor cerebri, Pulmonary embolism ([place]) (2004), and Radiation (4/13,4/15,04/30/14).   Surgical History    Past Surgical History:  Procedure Laterality Date  . ABDOMINAL AORTAGRAM N/A 08/10/2012   Procedure: ABDOMINAL Maxcine Ham;  Surgeon: Elam Dutch, MD;  Location: [facility] C[facility] LAB;  Service: Cardiovascular;  Laterality: N/A;  . ABDOMINAL HYSTERECTOMY    . BRAIN  SURGERY     10 YEARS AGO   . CATARACT EXTRACTION     BOTH EYES  . CHOLECYSTECTOMY    . COLON RESECTION    . COLONOSCOPY    . INSERTION OF ILIAC STENT Bilateral 08/10/2012   Procedure: INSERTION OF ILIAC STENT;  Surgeon: Elam Dutch, MD;  Location: [facility] CATH LAB;  Service: Cardiovascular;  Laterality: Bilateral;  . Lower extremity stents     Per Dr. Oneida Alar  . UPPER GASTROINTESTINAL ENDOSCOPY       Social History   reports that she has been smoking cigarettes. She has a 30.00 pack-year smoking history. She has never used smokeless tobacco. She reports that she does not drink alcohol or use drugs.   Family History   Her family history includes Cancer in her mother; Heart attack in her mother; Hyperlipidemia in her mother and  sister; Hypertension in her mother; Lung cancer in her father. There is no history of Colon cancer.   Allergies Allergies  Allergen Reactions  . Codeine Anaphylaxis  . Penicillins Anaphylaxis    Did it involve swelling of the face/tongue/throat, SOB, or low BP? Unknown Did it involve sudden or severe rash/hives, skin peeling, or any reaction on the inside of your mouth or nose? Unknown Did you need to seek medical attention at a hospital or doctor's office? Unknown When did it last happen? If all above answers are "NO", may proceed with cephalosporin use.   . Sulfa Antibiotics Anaphylaxis     Home Medications  Prior to Admission medications   Medication Sig Start Date End Date Taking? Authorizing Provider  acetaminophen (TYLENOL) 325 MG tablet Take 2 tablets (650 mg total) by mouth every 6 (six) hours as needed for mild pain (or Fever >/= 101). 03/02/18  Yes Johnson, Clanford L, MD  albuterol (PROVENTIL HFA;VENTOLIN HFA) 108 (90 Base) MCG/ACT inhaler Inhale 2 puffs into the lungs every 6 (six) hours as needed for wheezing or shortness of breath. 02/05/18  Yes Stacks, Cletus Gash, MD  ALPRAZolam Duanne Moron) 0.5 MG tablet Take 1 tablet (0.5 mg total) by mouth 2  (two) times daily as needed for anxiety. TAKE (1) TABLET TWICE A DAY. 03/02/18  Yes Johnson, Clanford L, MD  aspirin EC 81 MG tablet Take 1 tablet (81 mg total) by mouth daily. 03/02/18  Yes Johnson, Clanford L, MD  budesonide-formoterol (SYMBICORT) 160-4.5 MCG/ACT inhaler Inhale 2 puffs into the lungs 2 (two) times daily. 02/05/18  Yes Claretta Fraise, MD  camphor-menthol [facility]) lotion Apply 1 application topically See admin instructions. Apply to legs and feet topically every evening shift for dry skin   Yes [provider]  Cholecalciferol 50 MCG (2000 UT) TABS Take 1 tablet by mouth daily.   Yes [provider]  dexamethasone (DECADRON) 4 MG tablet Take 4 mg by mouth every morning. Give 1 tablet by mouth in the morning for appetite stimulant   Yes [provider]  donepezil (ARICEPT) 5 MG tablet Take 1 tablet (5 mg total) by mouth at bedtime. 02/05/18  Yes Stacks, Cletus Gash, MD  doxycycline (ADOXA) 100 MG tablet Take 100 mg by mouth 2 (two) times daily. Give 1 tablet by mouth twice daily for left base infiltrate for 10 days   Yes [provider]  Fluticasone-Salmeterol (WIXELA INHUB) 250-50 MCG/DOSE AEPB Inhale 1 puff into the lungs 2 (two) times daily. Give 1 puff by mouth two times a day for COPD   Yes [provider]  folic acid (FOLVITE) 1 MG tablet Take 1 tablet (1 mg total) by mouth daily. 03/03/18  Yes Johnson, Clanford L, MD  furosemide (LASIX) 20 MG tablet Take 1 tablet (20 mg total) by mouth every other day. Patient taking differently: Take 20 mg by mouth See admin instructions. Give 1 tablet by mouth in the morning every 2 days related to heart failure 03/05/18  Yes Johnson, Clanford L, MD  ipratropium-albuterol (DUONEB) 0.5-2.5 (3) MG/3ML SOLN Take 3 mLs by nebulization 3 (three) times daily.   Yes [provider]  ondansetron (ZOFRAN) 4 MG tablet Take 4 mg by mouth every 4 (four) hours as needed for nausea.   Yes [provider]   oxybutynin (DITROPAN) 5 MG tablet Take 1 tablet (5 mg total) by mouth 4 (four) times daily. Patient taking differently: Take 5 mg by mouth 2 (two) times daily.  02/05/18  Yes Stacks, Cletus Gash,  MD  pantoprazole (PROTONIX) 40 MG tablet Take 1 tablet (40 mg total) by mouth daily. 02/05/18  Yes Claretta Fraise, MD  POTASSIUM CHLORIDE ER PO Take 1 tablet by mouth See admin instructions. Give 1 tablet by mouth in the morning every 2 days for supplement   Yes [provider]  potassium chloride (K-DUR,KLOR-CON) 10 MEQ tablet Take 1 tablet (10 mEq total) by mouth every other day. As a potassium supplement Patient not taking: Reported on 04/15/2018 03/05/18   Murlean Iba, MD     Critical care time: 30 mins    Kennieth Rad, MSN, AGACNP-BC [facility] Pgr: [phone] or if no answer [phone] 05/04/2018, 11:32 PM   Patient seen and examined, agree with above note.  I dictated the care and orders written for this patient under my direction. Multiorgan failure  AKI, acute on chronic resp failure with hypoxemia , septic shock, underlying COPD, HCAP , NH resident RVP and COVID 19 rule out DNR/DNI , comfort care  Spoke to sister over the phone and updated her.   Roxanne Mins, [place]

## 2018-04-12 NOTE — ED Notes (Signed)
Date and time results received: 04/24/2018 1706 (use smartphrase ".now" to insert current time)  Test: Lactic Acid Critical Value: 9.5  Name of Provider Notified: Dr Roderic Palau Orders Received? Or Actions Taken?: NA

## 2018-04-12 NOTE — ED Provider Notes (Addendum)
[facility] EMERGENCY DEPARTMENT Provider Note   CSN: [id number] Arrival date & time: 05/04/2018  1537    History   Chief Complaint Chief Complaint  Patient presents with  . Respiratory Distress    HPI [name] is a 85 y.o. female.     Patient is at a nursing home and has a history of heart failure and bad COPD.  She has been short of breath and weak.  Patient was picked up by paramedics breathing about 4 times a minute.  The paramedics went ahead and intubated the patient.  The history is provided by the nursing home. No language interpreter was used.  Weakness  Severity:  Severe Onset quality:  Sudden Timing:  Constant Progression:  Worsening Chronicity:  Recurrent Context: not alcohol use   Relieved by:  Nothing Worsened by:  Nothing Ineffective treatments:  None tried Associated symptoms: no abdominal pain     Past Medical History:  Diagnosis Date  . Anxiety disorder   . Cancer ([place])   . Colon adenoma   . COPD (chronic obstructive pulmonary disease) ([place])   . Depression   . Fall at home May 17, 2012   Pt. fell in tub today  . GERD (gastroesophageal reflux disease)   . Hyperlipidemia   . Irritable bowel syndrome   . Lymphocytic colitis 12/15/2011  . Peptic ulcer disease   . Pneumonia 2007   prolonged ICU stay on vent with trach  . Pseudotumor cerebri   . Pulmonary embolism ([place]) 2004   off anticoagulation  . Radiation 4/13,4/15,04/30/14   Left lower lung    Patient Active Problem List   Diagnosis Date Noted  . Sepsis ([place]) 05/04/2018  . Acute respiratory distress 02/23/2018  . Sepsis due to pneumonia ([place]) 02/23/2018  . Hypokalemia 02/23/2018  . Elevated troponin 02/23/2018  . Current smoker 09/05/2017  . Stress incontinence of urine 05/23/2016  . Venous stasis of both lower extremities 03/03/2016  . Lung cancer ([place]) 11/14/2014  . Hyperlipidemia with target LDL less than 100 11/14/2014  . Centrilobular emphysema ([place]) 10/08/2013  .  Protein-calorie malnutrition, severe ([place]) 06/21/2013  . COPD exacerbation ([place]) 06/20/2013  . PAD (peripheral artery disease) ([place]) 05/17/2012  . Pain in limb 05/17/2012  . Edema 02/16/2012  . IBS (irritable bowel syndrome) 02/09/2012  . Lymphocytic colitis 12/15/2011  . Personal history of adenomatous colonic polyps 12/02/2011  . GERD 10/20/2008    Past Surgical History:  Procedure Laterality Date  . ABDOMINAL AORTAGRAM N/A 08/10/2012   Procedure: ABDOMINAL Maxcine Ham;  Surgeon: Elam Dutch, MD;  Location: [facility] C[facility] LAB;  Service: Cardiovascular;  Laterality: N/A;  . ABDOMINAL HYSTERECTOMY    . BRAIN SURGERY     10 YEARS AGO   . CATARACT EXTRACTION     BOTH EYES  . CHOLECYSTECTOMY    . COLON RESECTION    . COLONOSCOPY    . INSERTION OF ILIAC STENT Bilateral 08/10/2012   Procedure: INSERTION OF ILIAC STENT;  Surgeon: Elam Dutch, MD;  Location: [facility] CATH LAB;  Service: Cardiovascular;  Laterality: Bilateral;  . Lower extremity stents     Per Dr. Oneida Alar  . UPPER GASTROINTESTINAL ENDOSCOPY       OB History   No obstetric history on file.      Home Medications    Prior to Admission medications   Medication Sig Start Date End Date Taking? Authorizing Provider  acetaminophen (TYLENOL) 325 MG tablet Take 2 tablets (650 mg total) by mouth every 6 (  six) hours as needed for mild pain (or Fever >/= 101). 03/02/18  Yes Johnson, Clanford L, MD  albuterol (PROVENTIL HFA;VENTOLIN HFA) 108 (90 Base) MCG/ACT inhaler Inhale 2 puffs into the lungs every 6 (six) hours as needed for wheezing or shortness of breath. 02/05/18  Yes Stacks, Cletus Gash, MD  ALPRAZolam Duanne Moron) 0.5 MG tablet Take 1 tablet (0.5 mg total) by mouth 2 (two) times daily as needed for anxiety. TAKE (1) TABLET TWICE A DAY. 03/02/18  Yes Johnson, Clanford L, MD  aspirin EC 81 MG tablet Take 1 tablet (81 mg total) by mouth daily. 03/02/18  Yes Johnson, Clanford L, MD  budesonide-formoterol (SYMBICORT) 160-4.5 MCG/ACT inhaler  Inhale 2 puffs into the lungs 2 (two) times daily. 02/05/18  Yes Claretta Fraise, MD  donepezil (ARICEPT) 5 MG tablet Take 1 tablet (5 mg total) by mouth at bedtime. 02/05/18  Yes Stacks, Cletus Gash, MD  folic acid (FOLVITE) 1 MG tablet Take 1 tablet (1 mg total) by mouth daily. 03/03/18  Yes Johnson, Clanford L, MD  furosemide (LASIX) 20 MG tablet Take 1 tablet (20 mg total) by mouth every other day. 03/05/18  Yes Johnson, Clanford L, MD  oxybutynin (DITROPAN) 5 MG tablet Take 1 tablet (5 mg total) by mouth 4 (four) times daily. 02/05/18  Yes Stacks, Cletus Gash, MD  pantoprazole (PROTONIX) 40 MG tablet Take 1 tablet (40 mg total) by mouth daily. 02/05/18  Yes Stacks, Cletus Gash, MD  potassium chloride (K-DUR,KLOR-CON) 10 MEQ tablet Take 1 tablet (10 mEq total) by mouth every other day. As a potassium supplement 03/05/18  Yes Murlean Iba, MD    Family History Family History  Problem Relation Age of Onset  . Lung cancer Father   . Cancer Mother   . Hyperlipidemia Mother   . Hypertension Mother   . Heart attack Mother   . Hyperlipidemia Sister   . Colon cancer Neg Hx     Social History Social History   Tobacco Use  . Smoking status: Current Every Day Smoker    Packs/day: 0.50    Years: 60.00    Pack years: 30.00    Types: Cigarettes  . Smokeless tobacco: Never Used  . Tobacco comment: 2/3 pks per day  Substance Use Topics  . Alcohol use: No    Alcohol/week: 0.0 standard drinks  . Drug use: No     Allergies   Codeine; Penicillins; and Sulfa antibiotics   Review of Systems Review of Systems  Unable to perform ROS: Intubated  Gastrointestinal: Negative for abdominal pain.  Neurological: Positive for weakness.     Physical Exam Updated Vital Signs BP (!) 121/54   Temp (!) 93 F (33.9 C)   Resp (!) 29   Ht 5\' 1"  (1.549 m)   Wt 49.9 kg   SpO2 98%   BMI 20.78 kg/m   Physical Exam Vitals signs and nursing note reviewed.  Constitutional:      Appearance: She is  well-developed.  HENT:     Head: Normocephalic.     Comments: Patient has an ET tube in place and is being bagged.    Ears:     Comments: Pupils dilated but reactive    Nose: Nose normal.  Eyes:     General: No scleral icterus.    Conjunctiva/sclera: Conjunctivae normal.  Neck:     Musculoskeletal: Neck supple.     Thyroid: No thyromegaly.  Cardiovascular:     Rate and Rhythm: Normal rate and regular rhythm.  Heart sounds: No murmur. No friction rub. No gallop.   Pulmonary:     Breath sounds: No stridor. No wheezing or rales.  Chest:     Chest wall: No tenderness.  Abdominal:     General: There is no distension.     Tenderness: There is no abdominal tenderness. There is no rebound.  Musculoskeletal: Normal range of motion.  Lymphadenopathy:     Cervical: No cervical adenopathy.  Skin:    Findings: No erythema or rash.  Neurological:     Motor: No abnormal muscle tone.     Coordination: Coordination normal.     Comments: Patient responding to painful stimuli only  Psychiatric:     Comments: Patient not reacting secondary to obtunded      ED Treatments / Results  Labs (all labs ordered are listed, but only abnormal results are displayed) Labs Reviewed  LACTIC ACID, PLASMA - Abnormal; Notable for the following components:      Result Value   Lactic Acid, Venous 9.5 (*)    All other components within normal limits  COMPREHENSIVE METABOLIC PANEL - Abnormal; Notable for the following components:   Sodium 131 (*)    Chloride 95 (*)    CO2 13 (*)    Glucose, Bld 121 (*)    BUN 97 (*)    Creatinine, Ser 1.75 (*)    Calcium 8.0 (*)    Total Protein 5.0 (*)    Albumin 2.3 (*)    AST 230 (*)    ALT 142 (*)    GFR calc non Af Amer 26 (*)    GFR calc Af Amer 30 (*)    Anion gap >20 (*)    All other components within normal limits  CBC WITH DIFFERENTIAL/PLATELET - Abnormal; Notable for the following components:   WBC 38.9 (*)    RBC 5.20 (*)    MCV 79.4 (*)     MCH 24.0 (*)    RDW 23.9 (*)    nRBC 0.3 (*)    Neutro Abs 34.8 (*)    Monocytes Absolute 1.5 (*)    Abs Immature Granulocytes 1.24 (*)    All other components within normal limits  URINALYSIS, ROUTINE W REFLEX MICROSCOPIC - Abnormal; Notable for the following components:   APPearance HAZY (*)    Hgb urine dipstick MODERATE (*)    Bacteria, UA FEW (*)    All other components within normal limits  BRAIN NATRIURETIC PEPTIDE - Abnormal; Notable for the following components:   B Natriuretic Peptide 149.0 (*)    All other components within normal limits  BLOOD GAS, ARTERIAL - Abnormal; Notable for the following components:   pH, Arterial 7.283 (*)    pCO2 arterial 29.8 (*)    pO2, Arterial 437 (*)    Bicarbonate 15.3 (*)    Acid-base deficit 11.7 (*)    All other components within normal limits  CULTURE, BLOOD (ROUTINE X 2)  CULTURE, BLOOD (ROUTINE X 2)  URINE CULTURE  LACTIC ACID, PLASMA    EKG None  Radiology Dg Chest Portable 1 View  Result Date: 05/01/2018 CLINICAL DATA:  Shortness of breath EXAM: PORTABLE CHEST 1 VIEW COMPARISON:  02/27/2018, 08/24/2016 FINDINGS: Endotracheal tube is 3.2 cm above the carina. There is no focal consolidation. There is left perihilar scarring. There is no pleural effusion or pneumothorax. The heart and mediastinal contours are unremarkable. The osseous structures are unremarkable. IMPRESSION: Endotracheal tube with the tip 3.2 cm above the carina. Electronically Signed  By: Kathreen Devoid   On: 05/02/2018 16:37    Procedures Procedures (including critical care time)  Medications Ordered in ED Medications  propofol (DIPRIVAN) 1000 MG/100ML infusion (15 mcg/kg/min  49.9 kg Intravenous Rate/Dose Change 04/28/2018 1733)  sodium chloride 0.9 % bolus 2,000 mL (has no administration in time range)  sodium chloride 0.9 % bolus 2,000 mL (2,000 mLs Intravenous New Bag/Given 05/04/2018 1611)  vancomycin (VANCOCIN) IVPB 1000 mg/200 mL premix (1,000 mg  Intravenous New Bag/Given 04/14/2018 1617)  aztreonam (AZACTAM) 2 g in sodium chloride 0.9 % 100 mL IVPB (0 g Intravenous Stopped 05/08/2018 1700)  methylPREDNISolone sodium succinate (SOLU-MEDROL) 125 mg/2 mL injection 125 mg (125 mg Intravenous Given 04/20/2018 1612)     Initial Impression / Assessment and Plan / ED Course  I have reviewed the triage vital signs and the nursing notes.  Pertinent labs & imaging results that were available during my care of the patient were reviewed by me and considered in my medical decision making (see chart for details). CRITICAL CARE Performed by: Milton Ferguson Total critical care time 45 minutes Critical care time was exclusive of separately billable procedures and treating other patients. Critical care was necessary to treat or prevent imminent or life-threatening deterioration. Critical care was time spent personally by me on the following activities: development of treatment plan with patient and/or surrogate as well as nursing, discussions with consultants, evaluation of patient's response to treatment, examination of patient, obtaining history from patient or surrogate, ordering and performing treatments and interventions, ordering and review of laboratory studies, ordering and review of radiographic studies, pulse oximetry and re-evaluation of patient's condition.   Patient is obviously septic.  Septic protocol was started.  Patient has a lactic of 9 and white count of 40,000 she will be admitted over at [facility] to the ICU critical care excepting      EKG Interpretation  Date/Time:  Thursday April 12 2018 16:39:30 EDT Ventricular Rate:  77 PR Interval:    QRS Duration: 107 QT Interval:  478 QTC Calculation: 541 R Axis:   71 Text Interpretation:  Sinus rhythm Right atrial enlargement Nonspecific T abnormalities, lateral leads ST elevation, consider inferior injury Prolonged QT interval Since last tracing rate slower 25 Feb 2018 Baseline wander  is present Confirmed by Rolland Porter [id number]) on 05-08-18 1:59:40 PM        Final Clinical Impressions(s) / ED Diagnoses   Final diagnoses:  Acute sepsis [facility])    ED Discharge Orders    None       Milton Ferguson, MD 04/22/2018 1754    Milton Ferguson, MD 05/10/18 [phone]

## 2018-04-12 NOTE — Progress Notes (Signed)
Pharmacy Antibiotic Note  [name] is a 85 y.o. female admitted on 05/01/2018 with sepsis.  Pharmacy has been consulted for Vancomycin dosing. Pt received 1gm Vanc and Aztreonam 2gm in ED ~1630  SCr up to 1.75 (0.56 1 mos ago)  Plan: Further vancomycin based on SCr Will f/u renal function, micro data, and pt's clinical condition May go to comfort care   Height: 5\' 4"  (162.6 cm) Weight: 110 lb (49.9 kg) IBW/kg (Calculated) : 54.7  Temp (24hrs), Avg:96.2 F (35.7 C), Min:92.7 F (33.7 C), Max:100.4 F (38 C)  Recent Labs  Lab 04/20/2018 1555 04/30/2018 1558 04/30/2018 1825  WBC 38.9*  --   --   CREATININE 1.75*  --   --   LATICACIDVEN  --  9.5* 7.8*    Estimated Creatinine Clearance: 18.9 mL/min (A) (by C-G formula based on SCr of 1.75 mg/dL (H)).    Allergies  Allergen Reactions  . Codeine Anaphylaxis  . Penicillins Anaphylaxis    Did it involve swelling of the face/tongue/throat, SOB, or low BP? Unknown Did it involve sudden or severe rash/hives, skin peeling, or any reaction on the inside of your mouth or nose? Unknown Did you need to seek medical attention at a hospital or doctor's office? Unknown When did it last happen? If all above answers are "NO", may proceed with cephalosporin use.   . Sulfa Antibiotics Anaphylaxis    Antimicrobials this admission: 4/2 Aztreonam >>  4/2 Vanc >>   Microbiology results: 4/2 BCx:  4/2 UCx:   4/2 RSV PCR:   4/2 MRSA PCR:   Thank you for allowing pharmacy to be a part of this patient's care.  Sherlon Handing, PharmD, BCPS Clinical pharmacist  **Pharmacist phone directory can now be found on [email or web address] ([email or web address]).  Listed under [place]. 04/11/2018 10:53 PM

## 2018-04-12 NOTE — ED Notes (Signed)
ED Provider at bedside. 

## 2018-04-12 NOTE — ED Notes (Signed)
Pt pulling at ET tube. Order received for soft wrist restraints from Dr Roderic Palau.

## 2018-04-13 LAB — RESPIRATORY PANEL BY PCR

## 2018-04-13 LAB — MRSA PCR SCREENING

## 2018-04-14 LAB — URINE CULTURE: Culture: >=100000 — AB

## 2018-04-16 LAB — NOVEL CORONAVIRUS, NAA (HOSP ORDER, SEND-OUT TO REF LAB; TAT 18-24 HRS): SARS-CoV-2, NAA: NOT DETECTED

## 2018-04-17 LAB — CULTURE, BLOOD (ROUTINE X 2)
Culture: NO GROWTH
Culture: NO GROWTH
Special Requests: ADEQUATE
Special Requests: ADEQUATE

## 2018-05-11 ENCOUNTER — Telehealth: Payer: Self-pay | Admitting: *Deleted

## 2018-05-11 NOTE — Progress Notes (Addendum)
Patient was transferred to 3M03 from [place] by [place]. Patient unresponsive with agonal breathing on vent. ELink notified and ground team came to assess patient at bedside. Patient was immediatly placed on airborne precaution per protocol due to suspected COVID-19. Ground team contacted family about patient's code status  And family agreed to place patient as DNR.   Family were able to camera in and made the decision to stop treatments. Patient was pronounced deceased at 00:09 04-20-18 by two RNs.

## 2018-05-11 NOTE — Telephone Encounter (Signed)
Received original D/C -D/C forwarded to Dr.McQuaid Esmond Plants) to sign for Dr. Alexis Goodell)

## 2018-05-11 DEATH — deceased

## 2018-05-15 NOTE — Telephone Encounter (Signed)
Received original signed D/C-D/C Mailed to [facility]. In a self stamped envelope as requested.

## 2018-06-11 NOTE — Death Summary Note (Signed)
[name] was a 85 y.o. female smoker admitted from [facility] facility on 04/14/2018 with altered mental status.  She was intubated by EMS.  She recently had treatment for pneumonia.  She was hypotensive and hypothermic in the ER.  She later developed a fever.  She required pressors for hypotension.  She was started on antibiotics.  She had negative COVID 19 test result.  Family opted for DNR status and transition to comfort measures.  She was extubated on 04/12/2018 and expired on April 19, 2018 at 12:09 AM.  Final diagnoses: Acute hypoxic respiratory failure Septic shock Enterobacter cloacae urinary tract infection Anion gap metabolic acidosis Lactic acidosis Acute kidney injury with ATN Elevated LFTs from shock COVID 19 ruled out History of severe COPD with bullous emphysema History of colon cancer  Chesley Mires, MD [place] 05/16/2018, 11:43 AM

## 2018-06-11 DEATH — deceased
